# Patient Record
Sex: Female | Born: 1970 | Race: White | Hispanic: No | Marital: Single | Smoking: Never smoker
Health system: Southern US, Community
[De-identification: ages and names within clinical notes are randomized; demographics above are authoritative.]

## PROBLEM LIST (undated history)

## (undated) DIAGNOSIS — S0300XA Dislocation of jaw, unspecified side, initial encounter: Secondary | ICD-10-CM

## (undated) DIAGNOSIS — F419 Anxiety disorder, unspecified: Secondary | ICD-10-CM

## (undated) DIAGNOSIS — J302 Other seasonal allergic rhinitis: Secondary | ICD-10-CM

## (undated) DIAGNOSIS — M1990 Unspecified osteoarthritis, unspecified site: Secondary | ICD-10-CM

## (undated) DIAGNOSIS — IMO0002 Reserved for concepts with insufficient information to code with codable children: Secondary | ICD-10-CM

## (undated) HISTORY — DX: Dislocation of jaw, unspecified side, initial encounter: S03.00XA

## (undated) HISTORY — DX: Other seasonal allergic rhinitis: J30.2

## (undated) HISTORY — DX: Anxiety disorder, unspecified: F41.9

## (undated) HISTORY — PX: TUBAL LIGATION: SHX77

## (undated) HISTORY — PX: CHOLECYSTECTOMY: SHX55

---

## 1998-06-25 ENCOUNTER — Encounter: Admission: RE | Admit: 1998-06-25 | Discharge: 1998-06-25 | Payer: Self-pay | Admitting: Obstetrics

## 1998-08-06 ENCOUNTER — Encounter: Admission: RE | Admit: 1998-08-06 | Discharge: 1998-08-06 | Payer: Self-pay | Admitting: Obstetrics

## 1998-09-11 ENCOUNTER — Ambulatory Visit (HOSPITAL_COMMUNITY): Admission: RE | Admit: 1998-09-11 | Discharge: 1998-09-11 | Payer: Self-pay | Admitting: *Deleted

## 1998-10-07 ENCOUNTER — Encounter: Admission: RE | Admit: 1998-10-07 | Discharge: 1998-10-07 | Payer: Self-pay | Admitting: Obstetrics & Gynecology

## 1998-11-18 ENCOUNTER — Encounter: Admission: RE | Admit: 1998-11-18 | Discharge: 1998-11-18 | Payer: Self-pay | Admitting: Obstetrics & Gynecology

## 1998-12-09 ENCOUNTER — Encounter: Admission: RE | Admit: 1998-12-09 | Discharge: 1998-12-09 | Payer: Self-pay | Admitting: Obstetrics & Gynecology

## 1998-12-10 ENCOUNTER — Ambulatory Visit (HOSPITAL_COMMUNITY): Admission: RE | Admit: 1998-12-10 | Discharge: 1998-12-10 | Payer: Self-pay | Admitting: Obstetrics & Gynecology

## 1998-12-23 ENCOUNTER — Encounter (HOSPITAL_COMMUNITY): Admission: RE | Admit: 1998-12-23 | Discharge: 1998-12-27 | Payer: Self-pay | Admitting: Obstetrics & Gynecology

## 1998-12-23 ENCOUNTER — Encounter: Admission: RE | Admit: 1998-12-23 | Discharge: 1998-12-23 | Payer: Self-pay | Admitting: Obstetrics & Gynecology

## 1998-12-26 ENCOUNTER — Encounter (INDEPENDENT_AMBULATORY_CARE_PROVIDER_SITE_OTHER): Payer: Self-pay | Admitting: Specialist

## 1998-12-26 ENCOUNTER — Inpatient Hospital Stay (HOSPITAL_COMMUNITY): Admission: AD | Admit: 1998-12-26 | Discharge: 1998-12-29 | Payer: Self-pay | Admitting: *Deleted

## 1999-08-21 ENCOUNTER — Emergency Department (HOSPITAL_COMMUNITY): Admission: EM | Admit: 1999-08-21 | Discharge: 1999-08-21 | Payer: Self-pay | Admitting: Emergency Medicine

## 2000-06-09 ENCOUNTER — Encounter (INDEPENDENT_AMBULATORY_CARE_PROVIDER_SITE_OTHER): Payer: Self-pay

## 2000-06-09 ENCOUNTER — Other Ambulatory Visit: Admission: RE | Admit: 2000-06-09 | Discharge: 2000-06-09 | Payer: Self-pay | Admitting: Obstetrics

## 2000-08-18 ENCOUNTER — Other Ambulatory Visit: Admission: RE | Admit: 2000-08-18 | Discharge: 2000-08-18 | Payer: Self-pay | Admitting: Obstetrics & Gynecology

## 2000-08-18 ENCOUNTER — Encounter: Admission: RE | Admit: 2000-08-18 | Discharge: 2000-08-18 | Payer: Self-pay | Admitting: Obstetrics & Gynecology

## 2000-09-15 ENCOUNTER — Encounter: Admission: RE | Admit: 2000-09-15 | Discharge: 2000-09-15 | Payer: Self-pay | Admitting: Obstetrics & Gynecology

## 2001-04-06 ENCOUNTER — Emergency Department (HOSPITAL_COMMUNITY): Admission: EM | Admit: 2001-04-06 | Discharge: 2001-04-07 | Payer: Self-pay | Admitting: Emergency Medicine

## 2001-04-08 ENCOUNTER — Encounter: Payer: Self-pay | Admitting: Emergency Medicine

## 2001-04-08 ENCOUNTER — Emergency Department (HOSPITAL_COMMUNITY): Admission: EM | Admit: 2001-04-08 | Discharge: 2001-04-08 | Payer: Self-pay | Admitting: Emergency Medicine

## 2001-09-02 ENCOUNTER — Emergency Department (HOSPITAL_COMMUNITY): Admission: EM | Admit: 2001-09-02 | Discharge: 2001-09-03 | Payer: Self-pay | Admitting: Emergency Medicine

## 2002-04-18 ENCOUNTER — Other Ambulatory Visit: Admission: RE | Admit: 2002-04-18 | Discharge: 2002-04-18 | Payer: Self-pay | Admitting: Obstetrics and Gynecology

## 2002-04-18 ENCOUNTER — Encounter: Admission: RE | Admit: 2002-04-18 | Discharge: 2002-04-18 | Payer: Self-pay | Admitting: Obstetrics and Gynecology

## 2002-05-16 ENCOUNTER — Encounter: Admission: RE | Admit: 2002-05-16 | Discharge: 2002-05-16 | Payer: Self-pay | Admitting: Internal Medicine

## 2003-03-03 ENCOUNTER — Emergency Department (HOSPITAL_COMMUNITY): Admission: AD | Admit: 2003-03-03 | Discharge: 2003-03-03 | Payer: Self-pay | Admitting: Family Medicine

## 2003-05-09 ENCOUNTER — Emergency Department (HOSPITAL_COMMUNITY): Admission: EM | Admit: 2003-05-09 | Discharge: 2003-05-09 | Payer: Self-pay | Admitting: Emergency Medicine

## 2003-06-09 ENCOUNTER — Encounter: Admission: RE | Admit: 2003-06-09 | Discharge: 2003-06-09 | Payer: Self-pay | Admitting: Sports Medicine

## 2003-06-09 ENCOUNTER — Other Ambulatory Visit: Admission: RE | Admit: 2003-06-09 | Discharge: 2003-06-09 | Payer: Self-pay | Admitting: Family Medicine

## 2003-12-11 ENCOUNTER — Emergency Department (HOSPITAL_COMMUNITY): Admission: EM | Admit: 2003-12-11 | Discharge: 2003-12-11 | Payer: Self-pay | Admitting: Emergency Medicine

## 2003-12-30 ENCOUNTER — Emergency Department (HOSPITAL_COMMUNITY): Admission: EM | Admit: 2003-12-30 | Discharge: 2003-12-30 | Payer: Self-pay | Admitting: Emergency Medicine

## 2004-01-28 ENCOUNTER — Ambulatory Visit: Payer: Self-pay | Admitting: Family Medicine

## 2004-02-02 ENCOUNTER — Emergency Department (HOSPITAL_COMMUNITY): Admission: EM | Admit: 2004-02-02 | Discharge: 2004-02-02 | Payer: Self-pay | Admitting: Emergency Medicine

## 2004-08-06 ENCOUNTER — Ambulatory Visit: Payer: Self-pay | Admitting: Sports Medicine

## 2004-08-07 ENCOUNTER — Encounter (INDEPENDENT_AMBULATORY_CARE_PROVIDER_SITE_OTHER): Payer: Self-pay | Admitting: *Deleted

## 2004-08-07 LAB — CONVERTED CEMR LAB

## 2004-08-17 ENCOUNTER — Ambulatory Visit: Payer: Self-pay | Admitting: Family Medicine

## 2004-08-31 ENCOUNTER — Other Ambulatory Visit: Admission: RE | Admit: 2004-08-31 | Discharge: 2004-08-31 | Payer: Self-pay | Admitting: Family Medicine

## 2004-08-31 ENCOUNTER — Ambulatory Visit: Payer: Self-pay | Admitting: Family Medicine

## 2004-10-22 ENCOUNTER — Ambulatory Visit: Payer: Self-pay | Admitting: Family Medicine

## 2006-04-06 DIAGNOSIS — F411 Generalized anxiety disorder: Secondary | ICD-10-CM | POA: Insufficient documentation

## 2006-04-07 ENCOUNTER — Encounter (INDEPENDENT_AMBULATORY_CARE_PROVIDER_SITE_OTHER): Payer: Self-pay | Admitting: *Deleted

## 2007-02-03 ENCOUNTER — Emergency Department (HOSPITAL_COMMUNITY): Admission: EM | Admit: 2007-02-03 | Discharge: 2007-02-03 | Payer: Self-pay | Admitting: Family Medicine

## 2007-07-07 ENCOUNTER — Emergency Department (HOSPITAL_COMMUNITY): Admission: EM | Admit: 2007-07-07 | Discharge: 2007-07-07 | Payer: Self-pay | Admitting: Emergency Medicine

## 2007-07-10 ENCOUNTER — Emergency Department (HOSPITAL_COMMUNITY): Admission: EM | Admit: 2007-07-10 | Discharge: 2007-07-10 | Payer: Self-pay | Admitting: Family Medicine

## 2007-07-13 ENCOUNTER — Emergency Department (HOSPITAL_COMMUNITY): Admission: EM | Admit: 2007-07-13 | Discharge: 2007-07-13 | Payer: Self-pay | Admitting: Emergency Medicine

## 2007-08-02 ENCOUNTER — Emergency Department (HOSPITAL_COMMUNITY): Admission: EM | Admit: 2007-08-02 | Discharge: 2007-08-02 | Payer: Self-pay | Admitting: Family Medicine

## 2007-11-08 ENCOUNTER — Ambulatory Visit: Payer: Self-pay | Admitting: Family Medicine

## 2007-11-08 LAB — CONVERTED CEMR LAB
Bilirubin Urine: NEGATIVE
Glucose, Urine, Semiquant: NEGATIVE
Ketones, urine, test strip: NEGATIVE
Nitrite: POSITIVE
Protein, U semiquant: 100
Specific Gravity, Urine: 1.015
Urobilinogen, UA: 0.2
pH: 6

## 2008-01-01 ENCOUNTER — Telehealth (INDEPENDENT_AMBULATORY_CARE_PROVIDER_SITE_OTHER): Payer: Self-pay | Admitting: *Deleted

## 2008-01-10 ENCOUNTER — Telehealth: Payer: Self-pay | Admitting: *Deleted

## 2008-01-11 ENCOUNTER — Ambulatory Visit: Payer: Self-pay | Admitting: Family Medicine

## 2008-01-11 ENCOUNTER — Encounter (INDEPENDENT_AMBULATORY_CARE_PROVIDER_SITE_OTHER): Payer: Self-pay | Admitting: Family Medicine

## 2008-01-11 LAB — CONVERTED CEMR LAB
Bilirubin Urine: NEGATIVE
Glucose, Urine, Semiquant: 100
Ketones, urine, test strip: NEGATIVE
Nitrite: NEGATIVE
Protein, U semiquant: NEGATIVE
Specific Gravity, Urine: 1.02
Urobilinogen, UA: 0.2
pH: 6

## 2008-04-24 ENCOUNTER — Encounter: Payer: Self-pay | Admitting: Family Medicine

## 2008-04-24 ENCOUNTER — Other Ambulatory Visit: Admission: RE | Admit: 2008-04-24 | Discharge: 2008-04-24 | Payer: Self-pay | Admitting: Family Medicine

## 2008-04-24 ENCOUNTER — Ambulatory Visit: Payer: Self-pay | Admitting: Family Medicine

## 2008-04-24 DIAGNOSIS — J301 Allergic rhinitis due to pollen: Secondary | ICD-10-CM

## 2008-04-24 DIAGNOSIS — N946 Dysmenorrhea, unspecified: Secondary | ICD-10-CM | POA: Insufficient documentation

## 2008-04-24 DIAGNOSIS — K219 Gastro-esophageal reflux disease without esophagitis: Secondary | ICD-10-CM | POA: Insufficient documentation

## 2008-04-24 LAB — CONVERTED CEMR LAB
ALT: 9 units/L (ref 0–35)
AST: 14 units/L (ref 0–37)
Albumin: 4.3 g/dL (ref 3.5–5.2)
Alkaline Phosphatase: 47 units/L (ref 39–117)
BUN: 13 mg/dL (ref 6–23)
CO2: 23 meq/L (ref 19–32)
Calcium: 9.3 mg/dL (ref 8.4–10.5)
Chlamydia, DNA Probe: NEGATIVE
Chloride: 105 meq/L (ref 96–112)
Creatinine, Ser: 0.83 mg/dL (ref 0.40–1.20)
GC Probe Amp, Genital: NEGATIVE
Glucose, Bld: 69 mg/dL — ABNORMAL LOW (ref 70–99)
HCT: 40.4 % (ref 36.0–46.0)
Hemoglobin: 12.9 g/dL (ref 12.0–15.0)
MCHC: 31.9 g/dL (ref 30.0–36.0)
MCV: 91.2 fL (ref 78.0–100.0)
Platelets: 362 10*3/uL (ref 150–400)
Potassium: 4.1 meq/L (ref 3.5–5.3)
RBC: 4.43 M/uL (ref 3.87–5.11)
RDW: 12.6 % (ref 11.5–15.5)
Sodium: 141 meq/L (ref 135–145)
TSH: 1.045 microintl units/mL (ref 0.350–4.500)
Total Bilirubin: 0.7 mg/dL (ref 0.3–1.2)
Total Protein: 7.2 g/dL (ref 6.0–8.3)
WBC: 7.4 10*3/uL (ref 4.0–10.5)
Whiff Test: NEGATIVE

## 2008-04-25 ENCOUNTER — Encounter: Payer: Self-pay | Admitting: Family Medicine

## 2008-07-14 ENCOUNTER — Emergency Department (HOSPITAL_COMMUNITY): Admission: EM | Admit: 2008-07-14 | Discharge: 2008-07-14 | Payer: Self-pay | Admitting: Emergency Medicine

## 2008-09-02 ENCOUNTER — Ambulatory Visit: Payer: Self-pay | Admitting: Family Medicine

## 2009-10-01 ENCOUNTER — Emergency Department (HOSPITAL_COMMUNITY): Admission: EM | Admit: 2009-10-01 | Discharge: 2009-10-01 | Payer: Self-pay | Admitting: Emergency Medicine

## 2009-12-22 ENCOUNTER — Emergency Department (HOSPITAL_COMMUNITY): Admission: EM | Admit: 2009-12-22 | Discharge: 2009-12-22 | Payer: Self-pay | Admitting: Emergency Medicine

## 2010-01-13 ENCOUNTER — Emergency Department (HOSPITAL_COMMUNITY)
Admission: EM | Admit: 2010-01-13 | Discharge: 2010-01-13 | Payer: Self-pay | Source: Home / Self Care | Admitting: Emergency Medicine

## 2010-03-10 ENCOUNTER — Ambulatory Visit (INDEPENDENT_AMBULATORY_CARE_PROVIDER_SITE_OTHER): Payer: Medicaid Other | Admitting: Family Medicine

## 2010-03-10 ENCOUNTER — Encounter: Payer: Self-pay | Admitting: Family Medicine

## 2010-03-10 DIAGNOSIS — M62838 Other muscle spasm: Secondary | ICD-10-CM | POA: Insufficient documentation

## 2010-03-10 DIAGNOSIS — M26629 Arthralgia of temporomandibular joint, unspecified side: Secondary | ICD-10-CM

## 2010-03-10 DIAGNOSIS — R5383 Other fatigue: Secondary | ICD-10-CM | POA: Insufficient documentation

## 2010-03-10 DIAGNOSIS — R5381 Other malaise: Secondary | ICD-10-CM

## 2010-03-10 DIAGNOSIS — M792 Neuralgia and neuritis, unspecified: Secondary | ICD-10-CM | POA: Insufficient documentation

## 2010-03-10 DIAGNOSIS — F411 Generalized anxiety disorder: Secondary | ICD-10-CM

## 2010-03-10 LAB — CONVERTED CEMR LAB
ALT: 30 units/L (ref 0–35)
AST: 23 units/L (ref 0–37)
Albumin: 4.2 g/dL (ref 3.5–5.2)
Alkaline Phosphatase: 52 units/L (ref 39–117)
BUN: 11 mg/dL (ref 6–23)
CO2: 27 meq/L (ref 19–32)
Calcium: 9.1 mg/dL (ref 8.4–10.5)
Chloride: 98 meq/L (ref 96–112)
Creatinine, Ser: 0.75 mg/dL (ref 0.40–1.20)
Glucose, Bld: 79 mg/dL (ref 70–99)
HCT: 37.8 % (ref 36.0–46.0)
Hemoglobin: 12.5 g/dL (ref 12.0–15.0)
MCHC: 33.1 g/dL (ref 30.0–36.0)
MCV: 93.6 fL (ref 78.0–100.0)
Platelets: 299 10*3/uL (ref 150–400)
Potassium: 4.2 meq/L (ref 3.5–5.3)
RBC: 4.04 M/uL (ref 3.87–5.11)
RDW: 12.6 % (ref 11.5–15.5)
Sodium: 135 meq/L (ref 135–145)
TSH: 2.161 microintl units/mL (ref 0.350–4.500)
Total Bilirubin: 0.4 mg/dL (ref 0.3–1.2)
Total Protein: 7.1 g/dL (ref 6.0–8.3)
WBC: 5.2 10*3/uL (ref 4.0–10.5)

## 2010-03-11 ENCOUNTER — Encounter: Payer: Self-pay | Admitting: Family Medicine

## 2010-03-11 ENCOUNTER — Ambulatory Visit (INDEPENDENT_AMBULATORY_CARE_PROVIDER_SITE_OTHER): Payer: Medicaid Other | Admitting: Family Medicine

## 2010-03-11 DIAGNOSIS — M62838 Other muscle spasm: Secondary | ICD-10-CM

## 2010-03-11 DIAGNOSIS — J301 Allergic rhinitis due to pollen: Secondary | ICD-10-CM

## 2010-03-11 DIAGNOSIS — M9981 Other biomechanical lesions of cervical region: Secondary | ICD-10-CM | POA: Insufficient documentation

## 2010-03-11 DIAGNOSIS — M999 Biomechanical lesion, unspecified: Secondary | ICD-10-CM | POA: Insufficient documentation

## 2010-03-11 NOTE — Miscellaneous (Signed)
Summary: PA for allegra   Clinical Lists Changes pa to md..Sally [facility] RN  April 25, 2008 2:36 PM   Patient does not qualify at this time.

## 2010-03-11 NOTE — Assessment & Plan Note (Signed)
Summary: possible bladder infection/eo   Vital Signs:  Patient Profile:   40 Years Old Female Weight:      118.6 pounds Temp:     98.7 degrees F Pulse rate:   106 / minute BP sitting:   109 / 77  (left arm)  Pt. in pain?   no  Vitals Entered By: Arlyss Repress CMA, (November 08, 2007 2:54 PM)                  PCP:  Helane Rima MD  Chief Complaint:  pressure with urination. frequency and dysuria x 1 week.  History of Present Illness: Bennetta is a 40 year old female presenting with dysuria, urinary frequency, and suprapubic pain x 1 week. She denies fever/chills, N/V/D, vaginal discharge, and high risk sexual behavior.    Current Allergies: No known allergies   Past Surgical History:    Reviewed history from 04/06/2006 and no changes required:       CS with BTL - 2000 -       Gallbladder surgery -1995 -   Family History:    Reviewed history from 04/06/2006 and no changes required:       CAD - grandmother       Colon CA -pat greatgrandmother       DM - paternal aunt/uncle       Thyroid disease-grandfather/maternal aunt  Social History:    Reviewed history from 04/06/2006 and no changes required:       Single. Lives with same partner x 5 yrs. 3 children (4 yr old child lives with her). 11y/o son lives w/ grandmother, 43 y/o child given up for adoption. No smoking, alcohol use.     Physical Exam  General:     Well-developed,well-nourished,in no acute distress; alert,appropriate and cooperative throughout examination Lungs:     Normal respiratory effort, chest expands symmetrically. Lungs are clear to auscultation, no crackles or wheezes. Heart:     Normal rate and regular rhythm. S1 and S2 normal without gallop, murmur, click, rub or other extra sounds. Abdomen:     Soft and normal bowel sounds, mild seprapubic tenderness to palpation, no CVA tenderness.    Impression & Recommendations:  Problem # 1:  UTI (ICD-599.0) Uncomplicated UTI.  Rx: Bactrim DS  2 tabs two times a day x 3 days. AZO OTC for relief of dysuria.  Advised to return if symptoms worsen or if she has fever, chills, back pain or other signs of pyelonephritis. Schedule physical.  Other Orders: Urinalysis-[facility] (00000)    ]  Laboratory Results   Urine Tests  Date/Time Received: November 08, 2007 2:58 PM  Date/Time Reported: November 08, 2007 3:37 PM   Routine Urinalysis   Color: yellow Appearance: Cloudy Glucose: negative   (Normal Range: Negative) Bilirubin: negative   (Normal Range: Negative) Ketone: negative   (Normal Range: Negative) Spec. Gravity: 1.015   (Normal Range: 1.003-1.035) Blood: moderate   (Normal Range: Negative) pH: 6.0   (Normal Range: 5.0-8.0) Protein: 100   (Normal Range: Negative) Urobilinogen: 0.2   (Normal Range: 0-1) Nitrite: positive   (Normal Range: Negative) Leukocyte Esterace: large   (Normal Range: Negative)  Urine Microscopic WBC/HPF: TNTC RBC/HPF: 1-5 Bacteria/HPF: 2+ Epithelial/HPF: 5-8    Comments: ...........test performed by...........Marland KitchenTerese Door, CMA      Appended Document: Orders Update     Clinical Lists Changes  Orders: Added new Test order of [facility]- Est Level  2 (60454) - Signed

## 2010-03-11 NOTE — Assessment & Plan Note (Signed)
Summary: CPE/pap,df   Vital Signs:  Patient profile:   40 year old female Height:      64.5 inches Weight:      125.9 pounds BMI:     21.35 Pulse rate:   90 / minute BP sitting:   113 / 76  (right arm)  Vitals Entered By: Arlyss Repress CMA, (April 24, 2008 3:44 PM) CC: Abdominal Pain Is Patient Diabetic? No Pain Assessment Patient in pain? no        History of Present Illness: Tasha Fernandez is a 40 year female presenting for CPE with PAP.  1. Dysmenorrhea: Complains of "bad periods" for much of her life. Has had some bleeding between periods recently. Flow usually monthly, 4-5 days. Patient in monogomous relationship with boyfriend. Hx BTL.  Hx of Anemia. Denies fatigue, SOB, CP. She has never tried BCPs. No FamHx of blood clots. She denies smoking. No evidence of early menopause including hot flashes or other changes in menses.  2. Seasonal Allergies: Patient states that she has severe allergies during certain times of the year, leading to nasal congestion and headaches. Requests Allegra-D. She denies problems at this time.  3. GERD: Complains of heartburn when she eats spicy foods or drinks alcohol. No pain at this time, but when she has the pain, it is intermittent, located epigastric region with some radiation to back, better with Zantac only sometimes.    Dyspepsia History:      There is a prior history of GERD.  She notes that it has been less than 12 months since the last episode of GERD, that there have been breakthrough symptoms despite maximum H-2 blocker or PPI therapy, and that she has never had an upper endoscopy or GI consultation.  The patient does not have a prior history of documented ulcer disease.  The dominant symptom is heartburn or acid reflux.  An H-2 blocker medication is currently being taken.  She notes that the symptoms have not improved with the H-2 blocker therapy.  Symptoms have persisted after 4 weeks of H-2 blocker treatment.  No previous upper  endoscopy has been done.     Habits & Providers     Tobacco Status: never  Current Medications (verified): 1)  Ortho Tri-Cyclen Lo 0.025 Mg Tabs (Norgestimate-Ethinyl Estradiol) .... Take One By Mouth Daily 2)  Omeprazole 20 Mg Cpdr (Omeprazole) .... One By Mouth Daily 3)  Allegra 180 Mg Tabs (Fexofenadine Hcl) .... Take One By Mouth Daily 4)  Allegra-D 24 Hour 180-240 Mg Xr24h-Tab (Fexofenadine-Pseudoephedrine)  Allergies (verified): No Known Drug Allergies  Past History:  Past Medical History:    Seasonal Allergies  Past Surgical History:    Caesarean section 2000    Cholecystectomy 1995    Tubal ligation 2000  Family History:    CAD- grandmother    Colon CA- pat greatgrandmother    DM- paternal aunt/uncle    Thyroid disease- grandfather/maternal aunt  Social History:    Smoking Status:  never  Review of Systems General:  Denies chills, fatigue, fever, loss of appetite, malaise, sleep disorder, sweats, weakness, and weight loss. Eyes:  Denies itching. ENT:  Complains of nasal congestion and postnasal drainage; denies earache, sinus pressure, and sore throat. GI:  Complains of abdominal pain and indigestion; denies constipation, diarrhea, nausea, and vomiting. GU:  Denies abnormal vaginal bleeding, decreased libido, discharge, dysuria, genital sores, urinary frequency, and urinary hesitancy. Allergy:  Complains of seasonal allergies and sneezing; denies hives or rash and persistent infections.  Physical  Exam  General:  Well-developed, well-nourished, in no acute distress; alert, appropriate and cooperative throughout examination. Head:  Normocephalic and atraumatic without obvious abnormalities. Eyes:  Vision grossly intact, pupils equal, pupils round, and pupils reactive to light.   Ears:  R ear normal and L ear normal.   Nose:  No external deformity and no nasal discharge.   Mouth:  Pharynx pink and moist.   Neck:  No deformities, masses, or tenderness  noted. Lungs:  Normal respiratory effort, chest expands symmetrically. Lungs are clear to auscultation, no crackles or wheezes. Heart:  Normal rate and regular rhythm. S1 and S2 normal without gallop, murmur, click, rub or other extra sounds. Abdomen:  Bowel sounds positive,abdomen soft and non-tender without masses, organomegaly or hernias noted. Genitalia:  Pelvic Exam:        External: normal female genitalia without lesions or masses        Vagina: normal without lesions or masses        Cervix: normal without lesions or masses        Adnexa: normal bimanual exam without masses or fullness        Uterus: normal by palpation        Pap smear: performed Msk:  No deformity or scoliosis noted of thoracic or lumbar spine.   Pulses:  R and L carotid, dorsalis pedis, and posterior tibial pulses are full and equal bilaterally Extremities:  No clubbing, cyanosis, edema, or deformity noted with normal full range of motion of all joints.   Neurologic:  No cranial nerve deficits noted. Station and gait are normal. DTRs are symmetrical throughout. Sensory, motor and coordinative functions appear intact. Skin:  Intact without suspicious lesions or rashes. Psych:  Oriented X 3, pressured speech, easily distracted.   Impression & Recommendations:  Problem # 1:  Gynecological examination-routine (ICD-V72.31) Assessment Unchanged Normal Exam.   Problem # 2:  SCREENING FOR MALIGNANT NEOPLASM OF THE CERVIX (ICD-V76.2) Assessment: Unchanged  Orders: Pap Smear-[facility] ([id number]) [facility] - Est  18-39 yrs ([address]11)  Problem # 3:  SEXUALLY TRANSMITTED DISEASE, EXPOSURE TO (ICD-V01.6) Will check STDs including GC/Chlamydia, RPR, HIV, Trichomonas. Gave free condoms.  Problem # 4:  DYSMENORRHEA (ICD-625.3) Assessment: Deteriorated Will Rx BCP in order to regulate menses, decrease flow. Patient with no FamHx blood clots, no tobacco. Will check CBC, TSH.  Orders: Comp Met-[facility] [id number]) CBC-[facility]  ([address]) [facility] - Est  18-39 yrs (57846)  Problem # 5:  ALLERGIC RHINITIS, SEASONAL (ICD-477.0) Assessment: New Discussed antihistamines including Zyrtec, Claritin, and Allegra. Patient wishing to take Allegra since it was recommended by a friend.  Orders: Comp Met-[facility] [phone]) [facility] - Est  18-39 yrs (24401)  Problem # 6:  GERD (ICD-530.81) Assessment: New Patient has some relief from Zantac. If dose is optimized and continues to be insufficient, may take Omeprazole. If worsens, will check H. pylori.  Her updated medication list for this problem includes:    Omeprazole 20 Mg Cpdr (Omeprazole) ..... One by mouth daily  Orders: CBC-[facility] (02725) [facility] - Est  18-39 yrs (36644)  Complete Medication List: 1)  Ortho Tri-cyclen Lo 0.025 Mg Tabs (Norgestimate-ethinyl estradiol) .... Take one by mouth daily 2)  Omeprazole 20 Mg Cpdr (Omeprazole) .... One by mouth daily 3)  Allegra 180 Mg Tabs (Fexofenadine hcl) .... Take one by mouth daily 4)  Allegra-d 24 Hour 180-240 Mg Xr24h-tab (Fexofenadine-pseudoephedrine)  Other Orders: TSH-[facility] ([id number]) HIV-[facility] ([id number]) RPR-[facility] [phone]) GC/Chlamydia-[facility] (87591/[id number]) Wet Prep- [facility] (30160)  Dyspepsia Assessment/Plan:  Step Therapy: GERD  Treatment Protocols:    Step-1: started    H-2 blocker chosen: Ranitidine 150mg  by mouth at bedtime    Step-2: started  Patient Instructions: 1)  Please schedule a follow-up appointment in 1 year.  2)  Please call if you have any questions or concerns. 3)  We will call with the results of your lab tests. 4)  The medication list was reviewed and reconciled.  All changed/ newly prescribed medications were explained.  A complete medication list was provided to the patient/ caregiver. Prescriptions: ALLEGRA-D 24 HOUR 180-240 MG XR24H-TAB (FEXOFENADINE-PSEUDOEPHEDRINE)   #30 x 1   Entered and Authorized by:   Helane Rima MD   Signed by:   Helane Rima MD on 04/24/2008   Method used:   Print then  Give to Patient   RxID:   [id number] ALLEGRA 180 MG TABS (FEXOFENADINE HCL) take one by mouth daily  #30 x 3   Entered and Authorized by:   Helane Rima MD   Signed by:   Helane Rima MD on 04/24/2008   Method used:   Print then Give to Patient   RxID:   [id number] OMEPRAZOLE 20 MG CPDR (OMEPRAZOLE) one by mouth daily  #90 x 3   Entered and Authorized by:   Helane Rima MD   Signed by:   Helane Rima MD on 04/24/2008   Method used:   Print then Give to Patient   RxID:   [id number] ORTHO TRI-CYCLEN LO 0.025 MG TABS (NORGESTIMATE-ETHINYL ESTRADIOL) take one by mouth daily  #1 x 3   Entered and Authorized by:   Helane Rima MD   Signed by:   Helane Rima MD on 04/24/2008   Method used:   Print then Give to Patient   RxID:   [id number]   Laboratory Results  Date/Time Received: April 24, 2008 4:03 PM  Date/Time Reported: April 24, 2008 4:33 PM   Wet [place] Source: vag WBC/hpf: 1-5 Bacteria/hpf: 2+  Rods Clue cells/hpf: none  Negative whiff Yeast/hpf: none Trichomonas/hpf: none Comments: sperm present ...............test performed by......Marland KitchenBonnie A. [place], [place] (ASCP)

## 2010-03-12 ENCOUNTER — Telehealth: Payer: Self-pay | Admitting: Family Medicine

## 2010-03-15 ENCOUNTER — Telehealth: Payer: Self-pay | Admitting: Family Medicine

## 2010-03-17 NOTE — Progress Notes (Signed)
Summary: phn msg   Phone Note Call from Patient Call back at [facility] Phone [phone] Call back at [phone]   Caller: Patient Summary of Call: has a question about fluid in ear Initial call taken by: De Nurse,  March 12, 2010 10:55 AM  Follow-up for Phone Call        she got an OTC decongestant instead of the Zyrtec. told her to take for 1 wkk & she should feel better. wanted to know if we will drain it if it does not relieve the fluid in the ear. told her no, we do not drain it. encourged her to keep taking the decongestant. call back in a wk for another appt if no relief. she agreed with the plan Follow-up by: Golden Circle RN,  March 12, 2010 11:11 AM

## 2010-03-17 NOTE — Letter (Signed)
Summary: Generic Letter  [facility]  [address]   [place], [place] [address]   Phone: [phone]  Fax: [phone]    03/11/2010  RAZIA SCREWS [address] [place], [place]  [address]  Dear Ms. Osorno,  I am happy to report that your recent labs were all normal.  Sincerely,   Helane Rima DO

## 2010-03-17 NOTE — Assessment & Plan Note (Signed)
Summary: MUSCLE TX PER DR WALLACE/RH   Vital Signs:  Patient profile:   40 year old female Height:      64.5 inches Weight:      145.56 pounds BMI:     24.69 BSA:     1.72 Pulse rate:   87 / minute BP sitting:   117 / 75  Vitals Entered By: Jone Baseman CMA (March 11, 2010 2:24 PM) CC: manipulation Is Patient Diabetic? No Pain Assessment Patient in pain? no        Primary Care Provider:  Helane Rima DO  CC:  manipulation.  History of Present Illness: 40 yo female here for  1.  Migraines-  Pt states she has been headache for about 7 months.  Pt also sattes she has had a sore throat but denies fever, chills, nausea, vomiting, diarrhea or constipation.  Pt is being treated for allergies but has not been using her flonas eon a regular basis. Pt has been put on a trial of tegratol for possible migraines but has not helped.  Pt has been using goody powder regularly as well and only a little improvement  2.  Pt was sent to me today for manipulation due to chronic muscle spasm. Pt states that her neck hurts on a daily basis and her back hurts as well.   Pt has had a good evaluation by her PCP without any rwed flag findings.  Pt has never been manipulated before  Does sleep on her right side with her right arm up.  Pain is mostly right sided but occurs on both side, worse through the day a lttile improvement with goody powders. No numbness, no loss of strength no bowel and bladder problems.   Habits & Providers  Alcohol-Tobacco-Diet     Tobacco Status: never  Current Medications (verified): 1)  Omeprazole 20 Mg Cpdr (Omeprazole) .... One By Mouth Daily 2)  Flonase 50 Mcg/act Susp (Fluticasone Propionate) .... 2 Squirts in Each Nostril Daily, Then After On Week Just One Squirt in Each Nostril  Allergies (verified): No Known Drug Allergies  Past History:  Past medical, surgical, family and social histories (including risk factors) reviewed, and no changes noted (except as  noted below).  Past Medical History: Reviewed history from 04/24/2008 and no changes required. Seasonal Allergies  Past Surgical History: Reviewed history from 04/24/2008 and no changes required. Caesarean section 2000 Cholecystectomy 1995 Tubal ligation 2000  Family History: Reviewed history from 04/24/2008 and no changes required. CAD- grandmother Colon CA- pat greatgrandmother DM- paternal aunt/uncle Thyroid disease- grandfather/maternal aunt  Social History: Reviewed history from 11/08/2007 and no changes required. Single. Lives with same partner x 5 yrs. 3 children (4 yr old child lives with her). 11y/o son lives w/ grandmother, 60 y/o child given up for adoption. No smoking, alcohol use.  Review of Systems       see hpi  Physical Exam  General:  Well-developed, well-nourished, in no acute distress; alert, appropriate and cooperative throughout examination. Vitals reviewed. Eyes:  No corneal or conjunctival inflammation noted. EOMI. Perrla. Funduscopic exam benign, without hemorrhages, exudates or papilledema. Vision grossly normal. Ears:  TM intact b/l + fluid level b/l no erythema non bulging.  Nose:  bluish hue to turbinates b/l no congestion Mouth:  +PND Neck:  shotty LAD Lungs:  Clear, normal effort. Heart:  RRR. Msk:  OMT findings  C4 right  C2 left T4 right T7 left L2 left Left on left sacrum .  Normal leg length  Pulses:  R and L carotid, dorsalis pedis, and posterior tibial pulses are full and equal bilaterally. Extremities:  No clubbing, cyanosis, edema, or deformity noted with normal full range of motion of all joints.     Impression & Recommendations:  Problem # 1:  ALLERGIC RHINITIS, SEASONAL (ICD-477.0) told pt to attempt to change to another antihistamine.   Suggested zyrtec OTC at bedtime to try.  To take flonase on reg basis.  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  Improved with muscle energy directed to the psoas muscle.  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  Orders: OMT 3-4 Body [place] [phone])  Problem # 5:  SPASM, MUSCLE (ICD-728.85) as above, told pt not to asleep with elbow above shoulder. Told to use motrin if muscle spasm occurs again.  Orders: [facility]- Est Level  3 (99213) OMT 3-4 Body Regions (29562)  Complete Medication List: 1)  Omeprazole 20 Mg Cpdr (Omeprazole) .... One by mouth daily 2)  Flonase 50 Mcg/act Susp (Fluticasone propionate) .... 2 squirts in each nostril daily, then after on week just one squirt in each nostril 3)  Zyrtec Allergy 10 Mg Tabs (Cetirizine hcl) .Marland Kitchen.. 1 tab at bedtime 4)  Motrin Ib 200 Mg Tabs (Ibuprofen) .... 3 tabs by mouth three times a day as needed for muscle spasm   Orders Added: 1)  [facility]- Est Level  3 [13086] 2)  OMT 3-4 Body Regions [57846]

## 2010-03-17 NOTE — Assessment & Plan Note (Signed)
Summary: discuss multiple issues/eo   Vital Signs:  Patient profile:   40 year old female Height:      64.5 inches Weight:      146 pounds BMI:     24.76 Temp:     98.0 degrees F oral Pulse rate:   79 / minute BP sitting:   105 / 69  (right arm) Cuff size:   regular  Vitals Entered By: Tessie Fass CMA (March 10, 2010 10:43 AM) CC: multiple issues Is Patient Diabetic? No   Primary Care Provider:  Helane Rima DO  CC:  multiple issues.  History of Present Illness: 40 yo F:  1. EAR/TMJ/HEAD/NECK PAIN: started 7 months ago. Pain is vague - ear/jaw/head, intermittent, "10/10." She denies fever/chills, N/V/D, dizziness, vision changes, neuro deficits. She endorses having a sore dry throat and swelling around her glands intermittently. "I feel like I have flu-ish symptoms, but I don't have the flu." She denies stress, depression, no changes in her life.   patient was evaluated in ED for this issue. she has been seen by mutiple specialists, including dentists, oral surgeons, neurologists, and ENT. 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 She is being followed by Dr. Maple Hudson - new medications include "Tegretol, some medication to slow down HR (BB), and some depression medication." She states that nothing is helping.  Habits & Providers  Alcohol-Tobacco-Diet     Tobacco Status: never  Current Medications (verified): 1)  Ortho Tri-Cyclen Lo 0.025 Mg Tabs (Norgestimate-Ethinyl Estradiol) .... Take One By Mouth Daily 2)  Omeprazole 20 Mg Cpdr (Omeprazole) .... One By Mouth Daily 3)  Flonase 50 Mcg/act Susp (Fluticasone Propionate) .... 2 Squirts in Each Nostril Daily, Then After On Week Just One Squirt in Each Nostril  Allergies (verified): No Known Drug Allergies PMH-FH-SH reviewed for relevance  Review of Systems      See HPI  Physical Exam  General:  Well-developed, well-nourished, in no acute distress; alert, appropriate and cooperative  throughout examination. Vitals reviewed. Head:  Normocephalic and atraumatic without obvious abnormalities. Patient endorses pain when opening mouth more wide. She does have slight click and dislocation on palpation left TMJ. Eyes:  No corneal or conjunctival inflammation noted. EOMI. Perrla. Funduscopic exam benign, without hemorrhages, exudates or papilledema. Vision grossly normal. Ears:  R ear normal and L ear normal.   Nose:  External nasal examination shows no deformity or inflammation. Nasal mucosa are pink and moist without lesions or exudates. Mouth:  Oral mucosa and oropharynx without lesions or exudates.  Teeth in good repair. Neck:  No deformities, masses, or tenderness noted. Lungs:  Clear, normal effort. Heart:  RRR. Pulses:  R and L carotid, dorsalis pedis, and posterior tibial pulses are full and equal bilaterally. Neurologic:  Alert & oriented X3, cranial nerves II-XII intact,  sensation intact to light touch.   Impression & Recommendations:  Problem # 1:  SPASM, MUSCLE (ICD-728.85) Assessment New  Patient with slightly pressured speech today and it is difficult to tease out her symptoms. 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 I believe that all of her issues are arising from cervical/upper trap muscle tension. Discussed relaxation techniques, massage, and proper posture with patient. Discussed OMT for this issue and she was interested in trying it. Will refer to Freddy Finner for OMT.   Orders: [facility]- Est  Level 4 ([address])  Problem # 2:  TMJ PAIN (ICD-524.62) Assessment: New  Patient will f/u with specialist as they  are already treating her. I will not Rx pain medications for this issue.  Orders: [facility]- Est  Level 4 (29562)  Problem # 3:  ANXIETY (ICD-300.00) Assessment: Unchanged This is likely contributing to the patient's s/s. However, she is unwilling to accept this at this time. Orders: [facility]- Est  Level 4 (99214)  Complete Medication List: 1)  Ortho  Tri-cyclen Lo 0.025 Mg Tabs (Norgestimate-ethinyl estradiol) .... Take one by mouth daily 2)  Omeprazole 20 Mg Cpdr (Omeprazole) .... One by mouth daily 3)  Flonase 50 Mcg/act Susp (Fluticasone propionate) .... 2 squirts in each nostril daily, then after on week just one squirt in each nostril  Other Orders: Comp Met-[facility] ([id number]) CBC-[facility] (96295) TSH-[facility] ([id number])  Patient Instructions: 1)  It was nice to see you today. 2)  I believe that your pain is coming from muscle tension. 3)  PLEASE MAKE AN APPOINTMENT TO SEE DR. [facility] FOR MUSCLE TREATMENT. 4)  Please ask Dr. Maple Hudson to send records to me. 5)  I am checking labs today.   Orders Added: 1)  Comp Met-[facility] [[id number]] 2)  CBC-[facility] [85027] 3)  TSH-[facility] [[id number]] 4)  [facility]- Est  Level 4 [03474]  Appended Document: discuss multiple issues/eo     Allergies: No Known Drug Allergies  Physical Exam  Msk:  Hypertonic cervical/upper trap mm. Motion restriction rotation right. Poor posture with head forward.   Complete Medication List: 1)  Ortho Tri-cyclen Lo 0.025 Mg Tabs (Norgestimate-ethinyl estradiol) .... Take one by mouth daily 2)  Omeprazole 20 Mg Cpdr (Omeprazole) .... One by mouth daily 3)  Flonase 50 Mcg/act Susp (Fluticasone propionate) .... 2 squirts in each nostril daily, then after on week just one squirt in each nostril

## 2010-03-19 ENCOUNTER — Telehealth: Payer: Self-pay | Admitting: Family Medicine

## 2010-03-25 NOTE — Progress Notes (Signed)
   Phone Note Call from Patient   Caller: Patient Call For: [phone] Summary of Call: Please call patient back for results to tests taken last week/ Initial call taken by: Abundio Miu,  March 15, 2010 2:18 PM  Follow-up for Phone Call        LM Follow-up by: Golden Circle RN,  March 15, 2010 3:42 PM  Additional Follow-up for Phone Call Additional follow up Details #1::        Pt called back to leave new contact number.  [phone] Additional Follow-up by: Abundio Miu,  March 15, 2010 3:48 PM    Additional Follow-up for Phone Call Additional follow up Details #2::    told her all are normal & md has mailed her a letter stating this.  she talked at length about her problems. difficult to redirect. has appt in a few wks with Dr. Katrinka Blazing Follow-up by: Golden Circle RN,  March 15, 2010 4:22 PM

## 2010-03-29 ENCOUNTER — Encounter: Payer: Self-pay | Admitting: Family Medicine

## 2010-03-29 ENCOUNTER — Ambulatory Visit (INDEPENDENT_AMBULATORY_CARE_PROVIDER_SITE_OTHER): Payer: Medicaid Other | Admitting: Family Medicine

## 2010-03-29 VITALS — BP 116/85 | HR 94 | Temp 98.5°F | Ht 65.0 in | Wt 141.0 lb

## 2010-03-29 DIAGNOSIS — J069 Acute upper respiratory infection, unspecified: Secondary | ICD-10-CM

## 2010-03-29 DIAGNOSIS — J301 Allergic rhinitis due to pollen: Secondary | ICD-10-CM

## 2010-03-29 DIAGNOSIS — J029 Acute pharyngitis, unspecified: Secondary | ICD-10-CM

## 2010-03-29 MED ORDER — CETIRIZINE HCL 10 MG PO TABS: 10.0000 mg | ORAL_TABLET | Freq: Every day | ORAL | Status: AC

## 2010-03-29 MED ORDER — AZITHROMYCIN 500 MG PO TABS: 500.0000 mg | ORAL_TABLET | Freq: Every day | ORAL | Status: AC

## 2010-03-29 NOTE — Assessment & Plan Note (Signed)
Due to pt hx of long process and duration will treat as URI even though clinically pt seems well but pt was adamant this is not normal for her Azithro x 3 days Stop Flonase for 48 hours then take as prescribed educated pt on the potential consequence of increasing dose on own Told to change antihistamine.

## 2010-03-29 NOTE — Progress Notes (Signed)
  Subjective:    Patient ID: Tasha Fernandez, female    DOB: April 21, 1970, 40 y.o.   MRN: [id number]  HPI Pt states she has had some sore throat for some time now, States it has been weeks, has tried many OTC medications without help.  Has been using her Flonase multiple times a day without relief, kept denies fever, chills and nausea or vomiting.  Does state she has had a cough that was productive for sometime but not anymore.    Review of Systems see above     Objective:   Physical Exam     General Appearance:    Alert, cooperative, no distress, appears stated age     Eyes:    PERRL, conjunctiva/corneas clear, EOM's intact,   Ears:    Normal TM's and external ear canals, both ears  Nose:   Nares normal, septum midline, mucosa normal, no drainage    or sinus tenderness  Throat:   Lips, mucosa, and tongue normal; teeth and gums normal mild post nasal drip  Neck:   Supple, symmetrical, trachea midline, no adenopathy;    thyroid:  no enlargement/tenderness/nodules;      Lungs:     Clear to auscultation bilaterally, respirations unlabored      Heart:    Regular rate and rhythm, S1 and S2 normal, no murmur, rub   or gallop                                 Assessment & Plan:

## 2010-03-29 NOTE — Patient Instructions (Signed)
I want you to take this antibiotic called azithromax.  Take 1 pill daily for three days I want you to stop your nose spray for 2 days, then only do 1 spray each nostril twice daily I want you to stop allegra Try zyrtec 10mg  at night to help with allergies.

## 2010-03-30 ENCOUNTER — Telehealth: Payer: Self-pay | Admitting: Family Medicine

## 2010-03-30 NOTE — Telephone Encounter (Signed)
Spoke with patient and suggested throat lozenges such as Sucrets,  ibuproefn or tylenol for discomfort, salt water gargle. Also suggested a cool mist humifier might also help .

## 2010-04-01 ENCOUNTER — Telehealth: Payer: Self-pay | Admitting: Family Medicine

## 2010-04-01 NOTE — Telephone Encounter (Signed)
Requesting referral to ENT, pt seen at neurologist & they suggested ENT.

## 2010-04-02 NOTE — Telephone Encounter (Signed)
Please clarify - she told me at our last visit that she has already seen ENT. Why hasn't the Neurologist referred her to ENT?

## 2010-04-05 NOTE — Telephone Encounter (Signed)
Pt has [employer], per [employer], pcp must do the referrals.

## 2010-04-06 NOTE — Telephone Encounter (Signed)
The patient told me at her last visit that she has already seen ENT. Please clarify. Also, I want to review records from her Neurologist - please ask her to come back and sign a record release.

## 2010-04-06 NOTE — Telephone Encounter (Signed)
Called pt, scheduled an appt to discuss referral & MD can have release form signed to get records for neurologist.

## 2010-04-09 NOTE — Telephone Encounter (Signed)
To red team for review

## 2010-04-16 ENCOUNTER — Ambulatory Visit: Payer: Medicaid Other | Admitting: Family Medicine

## 2010-05-03 ENCOUNTER — Ambulatory Visit: Payer: Medicaid Other | Admitting: Family Medicine

## 2010-05-24 ENCOUNTER — Inpatient Hospital Stay (INDEPENDENT_AMBULATORY_CARE_PROVIDER_SITE_OTHER)
Admission: RE | Admit: 2010-05-24 | Discharge: 2010-05-24 | Disposition: A | Discharge status: Home / Self Care | Payer: Medicaid Other | Source: Ambulatory Visit | Attending: Family Medicine | Admitting: Family Medicine

## 2010-05-24 DIAGNOSIS — J069 Acute upper respiratory infection, unspecified: Secondary | ICD-10-CM

## 2010-05-24 DIAGNOSIS — J309 Allergic rhinitis, unspecified: Secondary | ICD-10-CM

## 2010-05-24 LAB — POCT RAPID STREP A (OFFICE): Streptococcus, Group A Screen (Direct): NEGATIVE

## 2010-06-07 ENCOUNTER — Ambulatory Visit: Payer: Medicaid Other | Admitting: Family Medicine

## 2010-06-08 ENCOUNTER — Encounter: Payer: Self-pay | Admitting: Family Medicine

## 2010-06-08 ENCOUNTER — Ambulatory Visit (INDEPENDENT_AMBULATORY_CARE_PROVIDER_SITE_OTHER): Payer: Medicaid Other | Admitting: Family Medicine

## 2010-06-08 VITALS — BP 115/78 | HR 104 | Temp 98.1°F | Wt 139.0 lb

## 2010-06-08 DIAGNOSIS — R05 Cough: Secondary | ICD-10-CM | POA: Insufficient documentation

## 2010-06-08 DIAGNOSIS — K219 Gastro-esophageal reflux disease without esophagitis: Secondary | ICD-10-CM

## 2010-06-08 DIAGNOSIS — J301 Allergic rhinitis due to pollen: Secondary | ICD-10-CM

## 2010-06-08 DIAGNOSIS — J029 Acute pharyngitis, unspecified: Secondary | ICD-10-CM

## 2010-06-08 NOTE — Assessment & Plan Note (Signed)
Likely contributing to symptoms of sore throat as well. Advised to restart Flonase and continue Claritin. Follow up with PCP.

## 2010-06-08 NOTE — Patient Instructions (Signed)
It was great to meet you today! You DO NOT have any signs of infection in your throat today I think your sore throat is coming from two causes - 1) Heartburn (TAKE Prilosec OTC as directed and avoid greasy, spicy foods and alcohol) 2) Allergies (START taking your Flonase and continue your Claritin.   Follow up with Dr. Earlene Plater in one month if symptoms not improved - REMEMBER TO TAKE THE MEDICATIONS ABOVE TO HELP  - Dr. Wallene Huh

## 2010-06-08 NOTE — Assessment & Plan Note (Signed)
Likely contributing to sore throat. Advised to start Prilosec as directed. Advised to avoid possible triggers including spicy foods, alcohol. Follow up with PCP.

## 2010-06-08 NOTE — Progress Notes (Signed)
  Subjective:    Patient ID: Tasha Fernandez, female    DOB: Dec 09, 1970, 40 y.o.   MRN: [id number]  HPI 1) Sore throat: x 16 days. Feels "burning sensation in throat". Worse at night and first thing in morning. Not affected by foods. Has history of heartburn and has been prescribed Prilosec OTC which she has not been taking. Denies recent heartburn symptoms except "when [she] drink[s] a certain type of alcohol". Reports some runny nose and sinus pressure and ear pressure and itchy eyes and mild non-productive cough - reports history of allergic rhinitis but is not taking her Flonase or Zyrtec - instead she has been taking Vicks Allergy and Sinus Relief (which helped but she stopped taking that as well) and Claritin (which she started today). Denies lymphadenopathy, fatigue, weight loss, emesis, abdominal Fernandez, nausea, rash, fever, dyspnea, wheezing , chest Fernandez, neck swelling.   2) STD testing: Patient would like to have HIV testing today. Denies specific exposure, vaginal discharge, vaginal lesions. Reports sore throat as above.  Denies lymphadenopathy, fatigue, weight loss, emesis, abdominal Fernandez, nausea, rash, fever, dyspnea, wheezing , chest Fernandez, neck swelling.    Review of Systems As per HPI     Objective:   Physical Exam  Constitutional: She appears well-developed and well-nourished.       Perseverating on possible causes of sore throat  HENT:  Head: Normocephalic and atraumatic.  Right Ear: External ear normal.  Left Ear: External ear normal.  Nose: Nose normal. No mucosal edema or rhinorrhea. Right sinus exhibits no maxillary sinus tenderness and no frontal sinus tenderness. Left sinus exhibits no maxillary sinus tenderness and no frontal sinus tenderness.  Mouth/Throat: Uvula is midline, oropharynx is clear and moist and mucous membranes are normal. Mucous membranes are not pale and not dry. No oral lesions. No uvula swelling. No oropharyngeal exudate, posterior oropharyngeal edema,  posterior oropharyngeal erythema or tonsillar abscesses.  Eyes: Conjunctivae are normal. Right eye exhibits no discharge. Left eye exhibits no discharge.  Neck: Normal range of motion. Neck supple. No thyromegaly present.  Cardiovascular: Normal rate and regular rhythm.   Pulmonary/Chest: Effort normal and breath sounds normal. No respiratory distress. She has no wheezes.  Abdominal: Soft. Bowel sounds are normal. She exhibits no distension. There is no tenderness.  Lymphadenopathy:    She has no cervical adenopathy.          Assessment & Plan:

## 2010-06-16 ENCOUNTER — Telehealth: Payer: Self-pay | Admitting: Family Medicine

## 2010-06-16 NOTE — Telephone Encounter (Signed)
LVOM to let patient know that testing was negative.

## 2010-07-01 ENCOUNTER — Ambulatory Visit: Payer: Medicaid Other

## 2010-07-02 ENCOUNTER — Encounter: Payer: Self-pay | Admitting: Family Medicine

## 2010-07-02 ENCOUNTER — Ambulatory Visit (INDEPENDENT_AMBULATORY_CARE_PROVIDER_SITE_OTHER): Payer: Medicaid Other | Admitting: Family Medicine

## 2010-07-02 DIAGNOSIS — M26629 Arthralgia of temporomandibular joint, unspecified side: Secondary | ICD-10-CM

## 2010-07-02 DIAGNOSIS — H60399 Other infective otitis externa, unspecified ear: Secondary | ICD-10-CM

## 2010-07-02 DIAGNOSIS — H6090 Unspecified otitis externa, unspecified ear: Secondary | ICD-10-CM | POA: Insufficient documentation

## 2010-07-02 DIAGNOSIS — J301 Allergic rhinitis due to pollen: Secondary | ICD-10-CM

## 2010-07-02 MED ORDER — MONTELUKAST SODIUM 10 MG PO TABS: 10.0000 mg | ORAL_TABLET | Freq: Every day | ORAL | Status: DC

## 2010-07-02 MED ORDER — NEOMYCIN-POLYMYXIN-HC 3.5-10000-1 OT SUSP: 4.0000 [drp] | Freq: Four times a day (QID) | OTIC | Status: AC

## 2010-07-02 NOTE — Progress Notes (Signed)
  Subjective:    Patient ID: Tasha Fernandez, female    DOB: [birth year], 40 y.o.   MRN: [id number]  HPI 1.  Ear pain:  Present for 3-4 weeks.  Has been using cotton balls and several differnet OTC ear drops without relief.  Has tried pouring warm oil into her ear for relief, also has tried using hydrogen peroxide, all without relief.  Denies any ear drainage.  Complains of severe ear pain, sometimes awakens her from sleep.    2.  Allergies:  Has been complaining of rhinitis, sore throat, runny eyes for "years."  Has tried multiple OTC medications including Zyrtec, Allegra, Claritin as well as -D formulations.  These work for short period of time but not for any longer than a week.  Is now miserable due to symptoms.  Has seen over 13 doctors in past 2 years from here to [facility] with both allergies and TMJ, see below.  No recent illnesses.    3.  TMJ:  Long-time problem for patient.  Has been seen by ENT as well as TMJ specialist, who first told her it was TMJ and then later told her he wasn't sure.  Seen by neurologist for trigemenal neuralgia, placed on Tegretol, no relief, taken off, told it wasn't Trigeminal neuralgia.  Spontaneously regressed, but has recurred since ear pain began 3-4 weeks ago.  Pain with chewing, opening her mouth, speaking.  Does not radiate, mostly just over TMJ joint.  Describes as sharp stabbing pain.     Review of Systems See HPI above for review of systems.       Objective:   Physical Exam Gen:  Alert, cooperative patient who appears stated age in no acute distress.  Vital signs reviewed. HEENT:  [place]/AT.  EOMI, PERRL. No conjunctival erythema.  BL TM's pearly gray without redness or bulging.  Left ear canal with mod-severe erythema and edema.  Right ear canal with minimal erythema.  Pain on palpation of pinna and pre-auricles BL.  Pain over TMJ joint BL when opening mouth. MMM, tonsils non-erythematous, non-edematous.   Pulm:  Clear to auscultation bilaterally with  good air movement.  No wheezes or rales noted.   Cardiac:  Regular rate and rhythm without murmur auscultated.  Good S1/S2. Skin - no sores or suspicious lesions or rashes or color changes  Psych:  Very pressured speech, very tangential.  Hard to get a word in edgewise with her       Assessment & Plan:

## 2010-07-02 NOTE — Patient Instructions (Signed)
Use the cortisporin otic 4 drops 3 times a day for 10 days.  Do not use for longer.  This has a steroid to decrease inflammation, a pain reliever, and an antibiotic.  We will send in for Singulair, which is a prescription strength allergy medicine.  It takes several days before you will notice an effect, so make sure you take it everyday. We will refer you to an allergist as you've been having so much trouble for so long.

## 2010-07-02 NOTE — Assessment & Plan Note (Signed)
Has failed Zyrtec, FLonase, Singulair, Allegra. Will attempt Singulair.  Likely will need to complete prior auth. If not working, will refer to allergist as this is long-standing problem.

## 2010-07-02 NOTE — Assessment & Plan Note (Signed)
No changes to medications today. To continue Ibuprofen.  Possibly triggered/worsened by otitis externa.  Hopeful this will clear after she otitis externa improves. Recommended she wear mouth guard.

## 2010-07-02 NOTE — Assessment & Plan Note (Signed)
Cortisporin otic to treat.  Hopeful this will improve symptoms.

## 2010-07-07 ENCOUNTER — Telehealth: Payer: Self-pay | Admitting: *Deleted

## 2010-07-07 NOTE — Telephone Encounter (Signed)
PA required for Singulair. PA form placed in MD box.

## 2010-07-09 NOTE — Telephone Encounter (Signed)
Filled out Prior Auth - patient has documented failure of all three OTC non-sedating antihistamines as well as inhaled corticosteroid.

## 2010-07-12 NOTE — Telephone Encounter (Signed)
Approval received from [facility] for Singulair . Pharmacy notified.

## 2010-07-26 ENCOUNTER — Telehealth: Payer: Self-pay | Admitting: Family Medicine

## 2010-07-26 ENCOUNTER — Emergency Department (HOSPITAL_COMMUNITY)
Admission: EM | Admit: 2010-07-26 | Discharge: 2010-07-27 | Disposition: A | Discharge status: Home / Self Care | Payer: Medicaid Other | Attending: Emergency Medicine | Admitting: Emergency Medicine

## 2010-07-26 DIAGNOSIS — K219 Gastro-esophageal reflux disease without esophagitis: Secondary | ICD-10-CM | POA: Insufficient documentation

## 2010-07-26 DIAGNOSIS — M26609 Unspecified temporomandibular joint disorder, unspecified side: Secondary | ICD-10-CM | POA: Insufficient documentation

## 2010-07-26 DIAGNOSIS — R6884 Jaw pain: Secondary | ICD-10-CM | POA: Insufficient documentation

## 2010-07-26 NOTE — Telephone Encounter (Signed)
Tasha Fernandez need to have a referral to chiropractor in [place] for her TMJ.  She would need it faxed to [phone]  Ph# [phone]

## 2010-07-27 NOTE — Telephone Encounter (Signed)
Will fwd. To PCP for review .Thomasena Vandenheuvel  

## 2010-07-27 NOTE — Telephone Encounter (Signed)
Pt calling again and needs this referral ASAP.  She is in a great deal of pain and needs to go to this therapist tomorrow.

## 2010-07-27 NOTE — Telephone Encounter (Signed)
Tasha Fernandez had to go to the ED yesterday due to pain.  She want to have this referral sent asap.   Please let her know when this has been done so she can make her an appt.

## 2010-07-28 ENCOUNTER — Encounter: Payer: Self-pay | Admitting: *Deleted

## 2010-07-28 NOTE — Telephone Encounter (Signed)
Patient returned call, said she has already spoken with the office in [facility] and they do require a referral from [place]. Will call their office to try and schedule appointment.Busick, Rodena Medin

## 2010-07-28 NOTE — Telephone Encounter (Signed)
Called the office in [place], they do require referral. Will fax to [phone] and they will contact patient with appointment.Busick, Rodena Medin

## 2010-07-28 NOTE — Telephone Encounter (Signed)
Left message for patient to return call. If patient is in pain she needs to schedule appointment to be seen here. If she has chiropractor she prefers to see she needs to call them and schedule appointment. If they require referral we can put in letter.Busick, Rodena Medin

## 2010-07-28 NOTE — Telephone Encounter (Signed)
Chiropractor referral is not urgent or emergent. If patient is in that much pain, she should make an appointment. We also have no connection to chiropractor in [place] - patient to call them directly.

## 2010-08-19 ENCOUNTER — Encounter: Payer: Self-pay | Admitting: Family Medicine

## 2010-08-19 NOTE — Progress Notes (Signed)
Tasha Fernandez is seeing a [facility] for management of neck, and jaw pain and headaches. 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

## 2010-08-26 ENCOUNTER — Telehealth: Payer: Self-pay | Admitting: Family Medicine

## 2010-08-26 ENCOUNTER — Ambulatory Visit (INDEPENDENT_AMBULATORY_CARE_PROVIDER_SITE_OTHER): Payer: Medicaid Other | Admitting: Family Medicine

## 2010-08-26 ENCOUNTER — Encounter: Payer: Self-pay | Admitting: Family Medicine

## 2010-08-26 VITALS — BP 115/80 | HR 103 | Temp 96.8°F | Ht 65.6 in | Wt 131.0 lb

## 2010-08-26 DIAGNOSIS — R51 Headache: Secondary | ICD-10-CM

## 2010-08-26 MED ORDER — MELOXICAM 7.5 MG PO TABS: 7.5000 mg | ORAL_TABLET | Freq: Every day | ORAL | Status: DC

## 2010-08-26 NOTE — Telephone Encounter (Signed)
Pt needs rx for mobic faxed to [facility] not electronically sent, pharmacy has had trouble with system today.

## 2010-08-26 NOTE — Patient Instructions (Signed)
Thank you for coming in today. Take the meloxicam 1-2 pills a day for jaw pain and headache.  Go to the neurology visit.

## 2010-08-26 NOTE — Telephone Encounter (Signed)
Pt calling back, would like [place] to call her once its taken care of, needs today for migraines.

## 2010-08-26 NOTE — Telephone Encounter (Signed)
Called and lvm for pt concerning appt with [facility] for 11.2.2012 @ 130 pm. They have placed pt on cancellation list should anything become available.Laureen Ochs, Viann Shove

## 2010-08-26 NOTE — Telephone Encounter (Signed)
Called in mobic to pt's pharmacy. Lorenda Hatchet, Yuvia Plant Called pt. Left message 'meds called in' .Arlyss Repress

## 2010-08-27 ENCOUNTER — Telehealth: Payer: Self-pay | Admitting: Family Medicine

## 2010-08-27 ENCOUNTER — Encounter: Payer: Self-pay | Admitting: Family Medicine

## 2010-08-27 NOTE — Telephone Encounter (Signed)
Routed to [facility] [address]

## 2010-08-27 NOTE — Progress Notes (Signed)
Ms. Lohr presents to clinic today with headache for 2 months.  She notes that in the past she has been diagnosed with bilateral TMJ. Her TMJ is currently being treated by a chiropractor however she is still symptomatic daily. Her current headache is bitemporal bandlike and throbbing. She's taking NSAIDs which have not provided much relief. She does however have a phone number to a neurologist in [place] that she would like referral to for management of her headache. She is very overwhelmed by her chronic daily headache and wishes for further treatment. Additionally in the past she has presented to the emergency room where she received injections that helped her headaches but caused nausea and sedation and she does not wish for that again today. She has never had migraine prophylaxis or specific medications for migraine such as Imitrex. She denies any vision changes or focal weakness numbness or clumsiness. She additionally denies any daytime sleepiness or snoring.  PMH reviewed.  ROS as above otherwise neg  Exam:  Vs noted.  Gen: Well NAD HEENT: EOMI, PERRL,  funduscopic exam is normal with crisp disc margins bilaterally and normal optic disc to cup ratio is no venous congestion is noted .  MMM Lungs: CTABL Nl WOB Heart: RRR no MRG Abd: NABS, NT, ND Exts: Non edematous BL  LE Neuro: Cranial nerves II through XII are intact coordination sensation strength and reflexes are all normal gait is normal Psychiatric: Tearful throughout the exam however affect is relatively normal patient's judgment appears to be intact no suicidal or homicidal ideation.

## 2010-08-27 NOTE — Telephone Encounter (Signed)
Left message for patient to return call. We do not have any work in appointments available, if she is in that much pain she needs to go to urgent care.Busick, Rodena Medin

## 2010-08-27 NOTE — Assessment & Plan Note (Signed)
I suspect that Mrs. Merkey's headaches are multifactorial area and she certainly has some aspect of TMJ pain however I also suspect that she may have migraine or medication overuse headache.  She does not have any red flag signs or symptoms for tumor or other deadly etiology.   Plan: Please refer to neurologist as patient requests feel like this is a good idea.  We'll also use Mobic initially for one-month in hopes to better control TMJ pain as this may help her headache significantly.  If pain is still unbearable in one to 2 weeks we'll see back in clinic and initiate headache prophylactics with either a beta blocker or or Topamax.  We'll follow closely.

## 2010-08-27 NOTE — Telephone Encounter (Signed)
Left message with patient to return call. Appointment was scheduled at [facility] per her request. I can try to get her an earlier appointment with [facility], but if she wants to stay with [facility] she will have to wait until November unless they have a cancellation, she is on the list there.Busick, Rodena Medin

## 2010-08-27 NOTE — Telephone Encounter (Signed)
Rx called in. Thanks, Clayburn Pert

## 2010-08-27 NOTE — Telephone Encounter (Signed)
Patient has called back in severe pain and is now saying she wants to talk to someone about a referral for an oral surgeon.  She has TMJ but she doesn't know if that is causing her pain.  She states again that her pain is severe and wants to speak with someone immediately.  She isn't being rude, she just wants it to be know that this is serious.

## 2010-08-27 NOTE — Telephone Encounter (Signed)
Tasha Fernandez called to find out when her appt for Neurologist is.  I told her it is set for 11/2.  The patient began to cry stating she can't take the headaches anymore.  She wants to see if the appt can be moved up.  There was no phone number in the Encounter to give to her.  She would like to speak with someone as soon as possible.

## 2010-09-08 ENCOUNTER — Telehealth: Payer: Self-pay | Admitting: Family Medicine

## 2010-09-08 NOTE — Telephone Encounter (Signed)
Patient is requesting flexeril for TMJ, I do not see that she is currently on it at this time. Will forward to PCP for review.

## 2010-09-08 NOTE — Telephone Encounter (Signed)
Pt is requesting muscle relaxer for her TMJ and is causing pain- flexeril [facility] [address]

## 2010-09-09 MED ORDER — CYCLOBENZAPRINE HCL 10 MG PO TABS: 10.0000 mg | ORAL_TABLET | Freq: Three times a day (TID) | ORAL | Status: DC | PRN

## 2010-09-09 NOTE — Telephone Encounter (Signed)
Sent electronically 

## 2010-09-09 NOTE — Telephone Encounter (Signed)
Left message for patient to return call. Please tell her the flexeril was sent to her pharmacy.Carah Barrientes, Rodena Medin

## 2010-09-23 ENCOUNTER — Encounter: Payer: Self-pay | Admitting: Family Medicine

## 2010-09-23 ENCOUNTER — Telehealth: Payer: Self-pay | Admitting: Family Medicine

## 2010-09-23 ENCOUNTER — Ambulatory Visit (INDEPENDENT_AMBULATORY_CARE_PROVIDER_SITE_OTHER): Payer: Medicaid Other | Admitting: Family Medicine

## 2010-09-23 VITALS — BP 123/88 | HR 112 | Temp 98.3°F | Ht 65.5 in | Wt 137.1 lb

## 2010-09-23 DIAGNOSIS — M26629 Arthralgia of temporomandibular joint, unspecified side: Secondary | ICD-10-CM

## 2010-09-23 DIAGNOSIS — R51 Headache: Secondary | ICD-10-CM

## 2010-09-23 DIAGNOSIS — M2669 Other specified disorders of temporomandibular joint: Secondary | ICD-10-CM

## 2010-09-23 MED ORDER — GABAPENTIN 100 MG PO TABS: 100.0000 mg | ORAL_TABLET | Freq: Three times a day (TID) | ORAL | Status: DC

## 2010-09-23 MED ORDER — CYCLOBENZAPRINE HCL 10 MG PO TABS: 10.0000 mg | ORAL_TABLET | Freq: Three times a day (TID) | ORAL | Status: DC | PRN

## 2010-09-23 NOTE — Progress Notes (Signed)
  Subjective:    Patient ID: Tasha Fernandez, female    DOB: 1970/05/06, 40 y.o.   MRN: [id number]  HPI 1.  Headache:  Long-time problem for patient.  Under control last year, but worsened since Spring/summer of 2012.  Has seen many different doctors for this, diagnosed with both TMJ and trigeminal neuralgia in past.  Causes significant distress and discomfort in her life.  Seen at [facility] about 3 weeks ago, plan is for trial of botox injections later this month on August 30.  Has been on Tegretol in past, which she states actually helped for about a year or so.  Started sometime in 2011, patient unclear exactly when, but it was earlier that year.  Pain came back this year when she began chewing gum again, had stopped due to pain.  Now pain is severe, described as temporal-parietal pain.  Has had multiple imaging studies of head, all which have been negative.  Flexeril has also helped with pain in past.  Describes aching pressure in head.  No fevers, chills, changes in weight, night sweats, abd pain, N/V.   Review of Systems See HPI above for review of systems.       Objective:   Physical Exam Gen:  Alert, cooperative patient who appears stated age.  Tearful.  Mild distress.  Has cotton balls in ears.  Somewhat disheveled appearing.  Vital signs reviewed. Head:  [place]/AT Eyes:  Fundoscopic exam WNL bilaterally.  Good cup-to-disc margins.  No papilledema appreciated Nose:  Mildly prominent nasal turbinates BL Mouth: MMM, oropharynx pink, no tonsillar edema Neck: No masses or thyromegaly or limitation in range of motion.  No cervical lymphadenopathy.  MSK:  Some tightness appreciated BL trapezius muscles.  Left trapezius muscle appears to be in spasm, multiple trigger points noted.   Psych:  Depressed appearing, tearful.  No SI/HI      Assessment & Plan:

## 2010-09-23 NOTE — Telephone Encounter (Signed)
PT need rx called for flexeril and neurontin for pain

## 2010-09-24 ENCOUNTER — Ambulatory Visit: Payer: Medicaid Other | Admitting: Family Medicine

## 2010-09-25 NOTE — Assessment & Plan Note (Addendum)
Patient asking for Neurontin today as she has heard this is similar to Tegretol.  Discussed this at length with her.  Tegretol has helped her in past but she is afraid it won't work anymore and she will be stuck without any medication at all.   Discussed plan for now is to start low-dose Neurontin and this will need upward titration by PCP.  Recommended she continue therapeutic relationship with her PCP, it's better to have one doctor that knows her well.  Patient expressed understanding.  Discussed with her that this is a chronic medication, I will not refill it for her, and that she must follow-up with PCP for discussion to try this or another medication.   Refill Flexeril for neck spasm -- likely secondary to pain and tension held in her neck and not true cause of her headaches. No red flags by exam or history today.  She is to FU with PCP after botox injections at end of month to discuss further options with him.    Also, patient appears depressed.  I wonder if SSRI or another anti-depressive might help with her undefined chronic pain.   Over 40 minutes spent in patient care, including 30 minutes face to face time with patient.

## 2010-10-19 ENCOUNTER — Other Ambulatory Visit: Payer: Self-pay | Admitting: Family Medicine

## 2010-10-19 NOTE — Telephone Encounter (Signed)
Is asking for refill on her neurontin & valium [facility] [address]

## 2010-10-19 NOTE — Telephone Encounter (Signed)
Will forward to Dr Corey 

## 2010-10-20 MED ORDER — GABAPENTIN 100 MG PO CAPS: 100.0000 mg | ORAL_CAPSULE | Freq: Three times a day (TID) | ORAL | Status: DC

## 2010-10-20 NOTE — Telephone Encounter (Signed)
.   For Valium refill she'll have to present to clinic for physician evaluation. I will refill Neurontin today.

## 2010-10-27 ENCOUNTER — Emergency Department (HOSPITAL_COMMUNITY)
Admission: EM | Admit: 2010-10-27 | Discharge: 2010-10-27 | Disposition: A | Discharge status: Home / Self Care | Payer: Medicaid Other | Attending: Emergency Medicine | Admitting: Emergency Medicine

## 2010-10-27 DIAGNOSIS — R6884 Jaw pain: Secondary | ICD-10-CM | POA: Insufficient documentation

## 2010-11-04 LAB — CULTURE, ROUTINE-ABSCESS: Gram Stain: NONE SEEN

## 2010-11-05 ENCOUNTER — Ambulatory Visit: Payer: Medicaid Other

## 2010-11-08 ENCOUNTER — Ambulatory Visit: Payer: Medicaid Other | Admitting: Sports Medicine

## 2010-11-16 ENCOUNTER — Encounter: Payer: Self-pay | Admitting: Family Medicine

## 2010-11-16 ENCOUNTER — Ambulatory Visit (INDEPENDENT_AMBULATORY_CARE_PROVIDER_SITE_OTHER): Payer: Medicaid Other | Admitting: Family Medicine

## 2010-11-16 VITALS — BP 130/80 | HR 90 | Wt 138.0 lb

## 2010-11-16 DIAGNOSIS — M26629 Arthralgia of temporomandibular joint, unspecified side: Secondary | ICD-10-CM

## 2010-11-16 MED ORDER — GABAPENTIN 300 MG PO CAPS: 300.0000 mg | ORAL_CAPSULE | Freq: Three times a day (TID) | ORAL | Status: DC

## 2010-11-16 MED ORDER — CYCLOBENZAPRINE HCL 10 MG PO TABS: 10.0000 mg | ORAL_TABLET | Freq: Three times a day (TID) | ORAL | Status: DC | PRN

## 2010-11-16 NOTE — Patient Instructions (Addendum)
Thank you for coming in today. I increased your gabapentin to 300 three times a day.  Start taking 300mg  at night and 100mg  breakfast and lunch. I a few days take 300 at night and at lunch and 100 at breakfast.  In a week or so you should be taking 300 three times a day.  I refilled flexeril for spasm.  See me after you see the neurologist at [facility]. TMJ Problems  (Temporal Mandibular Joint Dysfunction) TMJ dysfunction means there are problems with the joint between your jaw and your skull. This is a joint lined by cartilage like other joints in your body but also has a small disc in the joint which keeps the bones from rubbing on each other. These joints are like other joints and can get inflamed (sore) from arthritis and other problems. When this joint gets sore, it can cause headaches and pain in the jaw and the face. CAUSES Usually the arthritic types of problems are caused by soreness in the joint. Soreness in the joint can also be caused by overuse. This may come from grinding your teeth. It may also come from mis-alignment in the joint. DIAGNOSIS (LEARNING WHAT IS WRONG) Diagnosis of this condition can often be made by history and exam. Sometimes your caregiver may need X-rays or an MRI scan to determine the exact cause. It may be necessary to see your dentist to determine if your teeth and jaws are lined up correctly. TREATMENT Most of the time this problem is not serious; however, sometimes it can persist (become chronic). When this happens medications that will cut down on inflammation (soreness) help. Sometimes a shot of cortisone into the joint will be helpful. If your teeth are not aligned it may help for your dentist to make a splint for your mouth that can help this problem. If no physical problems can be found, the problem may come from tension. If tension is found to be the cause, biofeedback or relaxation techniques may be helpful. HOME CARE INSTRUCTIONS  Later in the day,  applications of ice packs may be helpful. Ice can be used in a plastic bag with a towel around it to prevent frostbite to skin. This may be used about every 2 hours for 20 to 30 minutes, as needed while awake, or as directed by your caregiver.   Only take over-the-counter or prescription medicines for pain, discomfort, or fever as directed by your caregiver.   If physical therapy was prescribed, follow your caregiver's directions.   Wear mouth appliances as directed if they were given.  Document Released: 10/19/2000 Document Re-Released: 04/22/2008 [facility] Patient Information 2011 [place], [place].

## 2010-11-16 NOTE — Progress Notes (Signed)
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 In the interval she has been seen by neurology at [facility] in [place] for this very issue. She is receiving Botox injections for management of her pain. This has worked well and caused a significant reduction of her overall pain.  Her next visit is at the end of November.  Today however she notes continued bilateral TMJ pain and requests Flexeril for muscle spasm.  This has worked in the past and she no longer has any tablets.  Additionally she notes that gabapentin which was started several months ago has helped some.  She denies any weakness numbness or lack of coordination in her extremities.  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   PMH reviewed.  ROS as above otherwise neg Medications reviewed.  Exam:  BP 130/80  Pulse 90  Wt 138 lb (62.596 kg)  LMP 11/04/2010 Gen: Well NAD TMJ: No click with opening of the jaw. However pain when palpating open TMJs bilaterally.  Mouth has no dental caries apparent otherwise face and jaw are normal appearing.

## 2010-11-16 NOTE — Assessment & Plan Note (Signed)
Continuation of chronic problem. This is being managed by neurology at [facility]. Plan to refill Flexeril and increase gabapentin.  We'll followup in 2 months or sooner as needed.

## 2010-12-21 ENCOUNTER — Telehealth: Payer: Self-pay | Admitting: Family Medicine

## 2010-12-21 NOTE — Telephone Encounter (Signed)
Spoke with Dr. Denyse Amass who is willing to refill her Flexeril and Gabapentin.  He also recommends that she call her neurologist who does her Botox injections to see if he/she has any recommendations.  Will call patient and let her know Dr. Denyse Amass will send in refills this afternoon.

## 2010-12-21 NOTE — Telephone Encounter (Signed)
Having severe pain with her TMJ and is wanting something called in - wants to talk to nurse

## 2010-12-21 NOTE — Telephone Encounter (Signed)
Patient with severe jaw pain due to TMJ.

## 2010-12-21 NOTE — Telephone Encounter (Signed)
Calling back, wants to know if MD can increase the dosage of flexeril, says she is in severe pain due to the TMJ.

## 2010-12-22 ENCOUNTER — Other Ambulatory Visit: Payer: Self-pay | Admitting: Family Medicine

## 2010-12-22 DIAGNOSIS — M26629 Arthralgia of temporomandibular joint, unspecified side: Secondary | ICD-10-CM

## 2010-12-22 MED ORDER — GABAPENTIN 300 MG PO CAPS: 300.0000 mg | ORAL_CAPSULE | Freq: Three times a day (TID) | ORAL | Status: DC

## 2010-12-22 MED ORDER — CYCLOBENZAPRINE HCL 10 MG PO TABS: 10.0000 mg | ORAL_TABLET | Freq: Three times a day (TID) | ORAL | Status: DC | PRN

## 2010-12-22 NOTE — Telephone Encounter (Signed)
Tasha Fernandez is calling to find out if her Rx's for Neurontin and Flexeril have been sent to [facility] yet.  I did not see where this has happened.

## 2010-12-22 NOTE — Telephone Encounter (Signed)
Forward to PCP for refill request 

## 2011-01-07 ENCOUNTER — Ambulatory Visit (INDEPENDENT_AMBULATORY_CARE_PROVIDER_SITE_OTHER): Payer: Medicaid Other | Admitting: Family Medicine

## 2011-01-07 VITALS — BP 137/94 | HR 103 | Ht 65.5 in | Wt 139.0 lb

## 2011-01-07 DIAGNOSIS — M26629 Arthralgia of temporomandibular joint, unspecified side: Secondary | ICD-10-CM

## 2011-01-07 MED ORDER — DULOXETINE HCL 30 MG PO CPEP: 30.0000 mg | ORAL_CAPSULE | Freq: Every day | ORAL | Status: DC

## 2011-01-07 NOTE — Assessment & Plan Note (Signed)
Very difficult to control despite multiple different efforts. Plan to add Cymbalta for treatment of his chronic pain. Additionally encouraged patient to keep her oral surgery appointment for consideration of surgery. We'll followup in one month. Additionally we'll obtain rheumatoid factor and ANA as patient desires this labs.

## 2011-01-07 NOTE — Patient Instructions (Signed)
Thank you for coming in today. I am doing blood work for Rheumatoid arthritis and lupus, but these will be negative likely.  I am also starting cymbalta for your pain.  Take 1 pill daily then in two weeks start taking 2 pills.  Come back and see me in 1 month.  If you feel worse or feel like hurting yourself or others let me know ASAP.

## 2011-01-07 NOTE — Progress Notes (Signed)
Ms. Tasha Fernandez presents to clinic today to followup her bilateral TMJ pain. She has seen over 20 doctors for this issue in the past 18 months has tried multiple different therapies. Currently she seen a neurologist in [place] who is performing Botox injections which does help some. Additionally she has a oral surgery appointment on Monday for this issue as well. She wonders if there is any other explanation for the cause of her recalcitrant pain. She's concerned that she may have rheumatoid arthritis or some other explanation for her pain. She notes constant pain along her TMJ but does radiate to her lower jaw and maxilla.  She thinks that she may additionally have fibromyalgia it may be contributing to her pain.  She has been treated for trigeminal neuralgia in the past with Tegretol but this was not effective.  PMH reviewed.  ROS as above otherwise neg Medications reviewed. Current Outpatient Prescriptions  Medication Sig Dispense Refill  . cyclobenzaprine (FLEXERIL) 10 MG tablet Take 1 tablet (10 mg total) by mouth every 8 (eight) hours as needed for muscle spasms.  90 tablet  3  . gabapentin (NEURONTIN) 300 MG capsule Take 1 capsule (300 mg total) by mouth 3 (three) times daily.  90 capsule  6  . cetirizine (ZYRTEC) 10 MG tablet Take 1 tablet (10 mg total) by mouth daily.  30 tablet  2  . DULoxetine (CYMBALTA) 30 MG capsule Take 1 capsule (30 mg total) by mouth daily. 1 pill daily x 2 weeks then 2 pills daily  45 capsule  2  . fluticasone (FLONASE) 50 MCG/ACT nasal spray by Nasal route. 2 squirts in each nostril daily, than after one week just one squirt in each nostril       . meloxicam (MOBIC) 7.5 MG tablet Take 1 tablet (7.5 mg total) by mouth daily.  30 tablet  0  . montelukast (SINGULAIR) 10 MG tablet Take 1 tablet (10 mg total) by mouth daily.  30 tablet  2  . omeprazole (PRILOSEC OTC) 20 MG tablet Take 20 mg by mouth daily.          Exam:  BP 137/94  Pulse 103  Ht 5' 5.5" (1.664 m)   Wt 139 lb (63.05 kg)  BMI 22.78 kg/m2 Gen: Well NAD, anxious and uncomfortable appearing Face: Pain along the TMJ with mouth closed and open. Pain on mouth range of motion.

## 2011-01-10 ENCOUNTER — Encounter: Payer: Self-pay | Admitting: Family Medicine

## 2011-01-12 ENCOUNTER — Telehealth: Payer: Self-pay | Admitting: Family Medicine

## 2011-01-12 NOTE — Telephone Encounter (Signed)
Pt need another rx sent to pharmacy for her Flexerill.  Lost the bottle she had.

## 2011-01-13 ENCOUNTER — Ambulatory Visit (INDEPENDENT_AMBULATORY_CARE_PROVIDER_SITE_OTHER): Payer: Medicaid Other | Admitting: Family Medicine

## 2011-01-13 ENCOUNTER — Encounter: Payer: Self-pay | Admitting: Family Medicine

## 2011-01-13 DIAGNOSIS — M9981 Other biomechanical lesions of cervical region: Secondary | ICD-10-CM

## 2011-01-13 DIAGNOSIS — M26629 Arthralgia of temporomandibular joint, unspecified side: Secondary | ICD-10-CM

## 2011-01-13 DIAGNOSIS — M999 Biomechanical lesion, unspecified: Secondary | ICD-10-CM | POA: Insufficient documentation

## 2011-01-13 NOTE — Progress Notes (Signed)
  Subjective:    Patient ID: Tasha Fernandez, female    DOB: [birth year], 40 y.o.   MRN: [id number]  HPI Review of Systems    Objective:   Physical Exam    Assessment & Plan:  Tasha Fernandez presents to clinic today to followup her bilateral TMJ pain. Today only her left side is hurting her.  She has seen over 20 doctors for this issue in the past 18 months has tried multiple different therapies. Currently she seen a neurologist in [place] who is performing Botox injections which does help some. Additionally she has a oral surgery appointment on Monday for this issue as well. She wonders if there is any other explanation for the cause of her recalcitrant pain. She's concerned that she may have rheumatoid arthritis or some other explanation for her pain. She notes constant pain along her TMJ but does radiate to her lower jaw and maxilla.  She thinks that she may additionally have fibromyalgia it may be contributing to her pain.  She has been treated for trigeminal neuralgia in the past with Tegretol but this was not effective. Patient has seen a chiropractor as well had some relief sometimes with some manipulation and would like to consider that today.  PMH reviewed.  ROS as above otherwise neg Medications reviewed. Current Outpatient Prescriptions  Medication Sig Dispense Refill  . cyclobenzaprine (FLEXERIL) 10 MG tablet Take 1 tablet (10 mg total) by mouth every 8 (eight) hours as needed for muscle spasms.  90 tablet  3  . gabapentin (NEURONTIN) 300 MG capsule Take 1 capsule (300 mg total) by mouth 3 (three) times daily.  90 capsule  6  . cetirizine (ZYRTEC) 10 MG tablet Take 1 tablet (10 mg total) by mouth daily.  30 tablet  2  . DULoxetine (CYMBALTA) 30 MG capsule Take 1 capsule (30 mg total) by mouth daily. 1 pill daily x 2 weeks then 2 pills daily  45 capsule  2  . fluticasone (FLONASE) 50 MCG/ACT nasal spray by Nasal route. 2 squirts in each nostril daily, than after one week just one  squirt in each nostril       . meloxicam (MOBIC) 7.5 MG tablet Take 1 tablet (7.5 mg total) by mouth daily.  30 tablet  0  . montelukast (SINGULAIR) 10 MG tablet Take 1 tablet (10 mg total) by mouth daily.  30 tablet  2  . omeprazole (PRILOSEC OTC) 20 MG tablet Take 20 mg by mouth daily.          Exam:  BP 131/89  Pulse 86  Ht 5' 5.5" (1.664 m)  Wt 139 lb (63.05 kg)  BMI 22.78 kg/m2 Gen: Well NAD, anxious and uncomfortable appearing Face: Pain along the TMJ with mouth closed and open. Pain on mouth range of motion. No deviation of jaw with opening OMT Findings: Cervical: C3 rotated and side bent left C6 extended rotated and side bent right Thoracic: T4 extended rotated and side bent left Lumbar: L2 flexed rotated and side bent right Sacrum: Neutral

## 2011-01-13 NOTE — Assessment & Plan Note (Signed)
Pt had some left side swelling but very minimal. Patient actually has no deviation of the job on physical exam. Patient has seen multiple doctors for this problem and is continuing to see a neurologist and is going to see an orthodontist here in the near future. Patient is encouraged to get as much information as she can to allow her to make the right decision for himself. Patient did have some manipulation today mostly in the cervical region that did seem to help with most of the pain. Patient will return in 2-4 weeks for reevaluation if this was helpful.

## 2011-01-13 NOTE — Assessment & Plan Note (Signed)
After verbal consent pt did have HVLA, with marked improvement.  Gave side effects to look out for and can take anti inflammatories in the acute time frame.   

## 2011-01-13 NOTE — Patient Instructions (Signed)
Patient given verbal instructions 

## 2011-01-17 ENCOUNTER — Other Ambulatory Visit (HOSPITAL_COMMUNITY)
Admission: RE | Admit: 2011-01-17 | Discharge: 2011-01-17 | Disposition: A | Discharge status: Home / Self Care | Payer: Medicaid Other | Source: Ambulatory Visit | Attending: Family Medicine | Admitting: Family Medicine

## 2011-01-17 ENCOUNTER — Ambulatory Visit (INDEPENDENT_AMBULATORY_CARE_PROVIDER_SITE_OTHER): Payer: Medicaid Other | Admitting: Family Medicine

## 2011-01-17 ENCOUNTER — Encounter: Payer: Self-pay | Admitting: Family Medicine

## 2011-01-17 VITALS — BP 110/70 | Temp 97.7°F | Ht 65.5 in | Wt 139.0 lb

## 2011-01-17 DIAGNOSIS — N898 Other specified noninflammatory disorders of vagina: Secondary | ICD-10-CM

## 2011-01-17 DIAGNOSIS — N760 Acute vaginitis: Secondary | ICD-10-CM

## 2011-01-17 DIAGNOSIS — M26629 Arthralgia of temporomandibular joint, unspecified side: Secondary | ICD-10-CM

## 2011-01-17 DIAGNOSIS — Z01419 Encounter for gynecological examination (general) (routine) without abnormal findings: Secondary | ICD-10-CM | POA: Insufficient documentation

## 2011-01-17 DIAGNOSIS — Z124 Encounter for screening for malignant neoplasm of cervix: Secondary | ICD-10-CM

## 2011-01-17 DIAGNOSIS — G4440 Drug-induced headache, not elsewhere classified, not intractable: Secondary | ICD-10-CM

## 2011-01-17 DIAGNOSIS — Z0000 Encounter for general adult medical examination without abnormal findings: Secondary | ICD-10-CM

## 2011-01-17 LAB — POCT WET PREP (WET MOUNT)
Clue Cells Wet Prep HPF POC: NEGATIVE
Trichomonas Wet Prep HPF POC: NEGATIVE
Yeast Wet Prep HPF POC: NEGATIVE

## 2011-01-17 MED ORDER — CYCLOBENZAPRINE HCL 10 MG PO TABS: 10.0000 mg | ORAL_TABLET | Freq: Three times a day (TID) | ORAL | Status: DC | PRN

## 2011-01-17 NOTE — Patient Instructions (Signed)
Thank you for coming in today. I will refill your flexeril.  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  Read about it on [email or web address] and do some thinking. Do this fit you? See me in 2 weeks.

## 2011-01-17 NOTE — Assessment & Plan Note (Addendum)
Will see oral surgeon Wednesday.  Major problem for Ms. Arnell.  I think at this point referral to specialist care is the best idea. She has been not herself and we will follow this issue up in 2 weeks. Compounding this issue I think his medication overuse headache which is discussed separately.

## 2011-01-17 NOTE — Progress Notes (Signed)
Tasha Fernandez presents to clinic today to followup her headache and TMJ pain into have a wellness visit and a Pap smear.  She continues to experience daily headaches associated with her TMJ pain. She takes over-the-counter headache medicine daily. She has an appointment with a oral surgeon on Wednesday to discuss TMJ pain. She is desperate as she feels like nothing will help with her pain. Additionally she notes vaginal discharge associated with a fishy odor for the past several months. She denies any abdominal pain or high-risk sexual contacts recently.  PMH reviewed.  ROS as above otherwise neg Medications reviewed. Current Outpatient Prescriptions  Medication Sig Dispense Refill  . cetirizine (ZYRTEC) 10 MG tablet Take 1 tablet (10 mg total) by mouth daily.  30 tablet  2  . cyclobenzaprine (FLEXERIL) 10 MG tablet Take 1 tablet (10 mg total) by mouth every 8 (eight) hours as needed for muscle spasms.  90 tablet  3  . DULoxetine (CYMBALTA) 30 MG capsule Take 1 capsule (30 mg total) by mouth daily. 1 pill daily x 2 weeks then 2 pills daily  45 capsule  2  . fluticasone (FLONASE) 50 MCG/ACT nasal spray by Nasal route. 2 squirts in each nostril daily, than after one week just one squirt in each nostril       . gabapentin (NEURONTIN) 300 MG capsule Take 1 capsule (300 mg total) by mouth 3 (three) times daily.  90 capsule  6  . meloxicam (MOBIC) 7.5 MG tablet Take 1 tablet (7.5 mg total) by mouth daily.  30 tablet  0  . montelukast (SINGULAIR) 10 MG tablet Take 1 tablet (10 mg total) by mouth daily.  30 tablet  2  . omeprazole (PRILOSEC OTC) 20 MG tablet Take 20 mg by mouth daily.        Marland Kitchen DISCONTD: cyclobenzaprine (FLEXERIL) 10 MG tablet Take 1 tablet (10 mg total) by mouth every 8 (eight) hours as needed for muscle spasms.  90 tablet  3    Exam:  BP 110/70  Temp(Src) 97.7 F (36.5 C) (Oral)  Ht 5' 5.5" (1.664 m)  Wt 139 lb (63.05 kg)  BMI 22.78 kg/m2  LMP 01/06/2011 Gen: Well NAD HEENT: EOMI,   MMM, pain in both masseters and along the TMJ bilaterally. Lungs: CTABL Nl WOB Heart: RRR no MRG Abd: NABS, NT, ND Exts: Non edematous BL  LE, warm and well perfused.  GYN: Normal external genitalia with copious thin white discharge present. No cervical motion tenderness no adnexal masses present

## 2011-01-17 NOTE — Assessment & Plan Note (Signed)
Here for wellness visit. Past medical history, surgical history, social history, family history updated and reviewed. Pap smear obtained today. Will followup in clinic in 2 weeks to discuss her headaches.

## 2011-01-17 NOTE — Assessment & Plan Note (Signed)
In relation to TMJ pain. She takes pain medicines daily for her headache. I asked Tasha Fernandez to do some reading on medication overuse headache as her by in on this issue is important for the treatment. We'll followup in 2 weeks and initiate withdrawal of chronic daily NSAIDs and Tylenol.

## 2011-01-18 LAB — GC/CHLAMYDIA PROBE AMP, GENITAL: GC Probe Amp, Genital: NEGATIVE

## 2011-01-18 NOTE — Telephone Encounter (Signed)
resolved 

## 2011-01-24 ENCOUNTER — Other Ambulatory Visit: Payer: Self-pay | Admitting: Oral Surgery

## 2011-01-24 DIAGNOSIS — M2669 Other specified disorders of temporomandibular joint: Secondary | ICD-10-CM

## 2011-01-25 ENCOUNTER — Ambulatory Visit (INDEPENDENT_AMBULATORY_CARE_PROVIDER_SITE_OTHER): Payer: Medicaid Other | Admitting: Family Medicine

## 2011-01-25 DIAGNOSIS — M26629 Arthralgia of temporomandibular joint, unspecified side: Secondary | ICD-10-CM

## 2011-01-25 DIAGNOSIS — M9981 Other biomechanical lesions of cervical region: Secondary | ICD-10-CM

## 2011-01-25 DIAGNOSIS — M999 Biomechanical lesion, unspecified: Secondary | ICD-10-CM

## 2011-01-25 NOTE — Patient Instructions (Signed)
Patient given verbal instructions 

## 2011-01-25 NOTE — Assessment & Plan Note (Signed)
After verbal consent pt did have HVLA, with marked improvement.  Gave side effects to look out for and can take anti inflammatories in the acute time frame.   

## 2011-01-25 NOTE — Assessment & Plan Note (Signed)
Patient seen an oral surgeon and maybe having some intervention done in the near future. Patient has an MRI due tomorrow patient to continue the same medications. Patient given some stretches to do at night to see if these improve.

## 2011-01-25 NOTE — Progress Notes (Signed)
  Subjective:    Patient ID: Tasha Fernandez, female    DOB: Mar 22, 1970, 40 y.o.   MRN: [id number]  HPI Tasha Fernandez presents to clinic today to followup her bilateral TMJ pain. Today only her left side is hurting her. Patient states that the manipulation did seem to improve her neck pain. The pain in her TMJ did not seem to have too much. Patient recently had seen an oral surgeon who is getting an MRI done tomorrow to evaluate the TMJ further. Patient denies much of headaches now he denies any numbness in extremities or any visual changes. Patient is attempting to try to decrease the medications she takes the day for pain which maybe that would be contributing to it per Dr. Zollie Pee notes. Patient would like evaluation for OMT again.  PMH reviewed.  ROS as above otherwise neg Medications reviewed. Current Outpatient Prescriptions  Medication Sig Dispense Refill  . cyclobenzaprine (FLEXERIL) 10 MG tablet Take 1 tablet (10 mg total) by mouth every 8 (eight) hours as needed for muscle spasms.  90 tablet  3  . gabapentin (NEURONTIN) 300 MG capsule Take 1 capsule (300 mg total) by mouth 3 (three) times daily.  90 capsule  6  . cetirizine (ZYRTEC) 10 MG tablet Take 1 tablet (10 mg total) by mouth daily.  30 tablet  2  . DULoxetine (CYMBALTA) 30 MG capsule Take 1 capsule (30 mg total) by mouth daily. 1 pill daily x 2 weeks then 2 pills daily  45 capsule  2  . fluticasone (FLONASE) 50 MCG/ACT nasal spray by Nasal route. 2 squirts in each nostril daily, than after one week just one squirt in each nostril       . meloxicam (MOBIC) 7.5 MG tablet Take 1 tablet (7.5 mg total) by mouth daily.  30 tablet  0  . montelukast (SINGULAIR) 10 MG tablet Take 1 tablet (10 mg total) by mouth daily.  30 tablet  2  . omeprazole (PRILOSEC OTC) 20 MG tablet Take 20 mg by mouth daily.          Review of Systems     Objective:   Physical Exam Exam:  BP 131/89  Pulse 86  Ht 5' 5.5" (1.664 m)  Wt 139 lb (63.05 kg)   BMI 22.78 kg/m2 Gen: Well NAD, anxious and uncomfortable appearing Face: Pain along the TMJ with mouth closed and open mostly left-sided. Pain on mouth range of motion. No deviation of jaw with opening OMT Findings: Cervical: C3 rotated and side bent left Thoracic: T4 extended rotated and side bent left Lumbar: L2 flexed rotated and side bent left Sacrum: Neutral    Assessment & Plan:

## 2011-01-26 ENCOUNTER — Ambulatory Visit
Admission: RE | Admit: 2011-01-26 | Discharge: 2011-01-26 | Disposition: A | Discharge status: Home / Self Care | Payer: Medicaid Other | Source: Ambulatory Visit | Attending: Oral Surgery | Admitting: Oral Surgery

## 2011-01-26 DIAGNOSIS — M2669 Other specified disorders of temporomandibular joint: Secondary | ICD-10-CM

## 2011-01-27 ENCOUNTER — Telehealth: Payer: Self-pay | Admitting: Family Medicine

## 2011-01-27 NOTE — Telephone Encounter (Signed)
Attempted to call patient unidentified voicemail. Please tell patient pap was normal.Tasha Fernandez, Rodena Medin

## 2011-01-27 NOTE — Telephone Encounter (Signed)
Pt is asking for results of her pap smear.

## 2011-02-02 ENCOUNTER — Ambulatory Visit (INDEPENDENT_AMBULATORY_CARE_PROVIDER_SITE_OTHER): Payer: Medicaid Other | Admitting: Family Medicine

## 2011-02-02 ENCOUNTER — Encounter: Payer: Self-pay | Admitting: Family Medicine

## 2011-02-02 VITALS — BP 133/84 | HR 111 | Temp 98.2°F | Ht 65.5 in | Wt 138.0 lb

## 2011-02-02 DIAGNOSIS — M26629 Arthralgia of temporomandibular joint, unspecified side: Secondary | ICD-10-CM

## 2011-02-02 DIAGNOSIS — R300 Dysuria: Secondary | ICD-10-CM

## 2011-02-02 DIAGNOSIS — N390 Urinary tract infection, site not specified: Secondary | ICD-10-CM | POA: Insufficient documentation

## 2011-02-02 LAB — POCT UA - MICROSCOPIC ONLY

## 2011-02-02 LAB — POCT URINALYSIS DIPSTICK
Ketones, UA: NEGATIVE
Protein, UA: NEGATIVE
Urobilinogen, UA: 0.2
pH, UA: 6.5

## 2011-02-02 MED ORDER — DULOXETINE HCL 60 MG PO CPEP: 30.0000 mg | ORAL_CAPSULE | Freq: Every day | ORAL | Status: DC

## 2011-02-02 MED ORDER — CEPHALEXIN 500 MG PO CAPS: 500.0000 mg | ORAL_CAPSULE | Freq: Two times a day (BID) | ORAL | Status: AC

## 2011-02-02 NOTE — Assessment & Plan Note (Signed)
Patient has a urinary tract infection based on microscopy and symptoms. Plan to treat empirically with Keflex 500 mg twice a day.  We'll followup in one month or sooner if not resolved.

## 2011-02-02 NOTE — Progress Notes (Signed)
Addended by: [place], Tilda Samudio on: 02/02/2011 04:23 PM   Modules accepted: Orders

## 2011-02-02 NOTE — Patient Instructions (Signed)
Thank you for coming in today. You have a UTI. Take kelfex twice a day for 1 week.  I am doing a test to rule out a rare autoimmune disorder. It should come back soon. I will call you.  Come back in 1-2 months.  Take the new dose of cymbalta.

## 2011-02-02 NOTE — Progress Notes (Signed)
Tasha Fernandez is a 40 year old woman being followed by me for bilateral jaw pain that she associates with TMJ.  She comes in today however to discuss urinary frequency and urgency associated with a foul smelling urine. This has been present for several weeks in the foul smell for longer than that. She denies any abdominal pain fevers or chills. She feels well overall.   For her TMJ pain she continues to take duloxetine 30 mg in addition to gabapentin. She has been seen by her oral surgeon who has performed an MRI. The MRI shows no evidence of arthritis of the temporomandibular joint.  She has a followup appointment with her oral surgeon next week.    PMH reviewed.  ROS as above otherwise neg Medications reviewed. Current Outpatient Prescriptions  Medication Sig Dispense Refill  . cephALEXin (KEFLEX) 500 MG capsule Take 1 capsule (500 mg total) by mouth 2 (two) times daily.  14 capsule  0  . cetirizine (ZYRTEC) 10 MG tablet Take 1 tablet (10 mg total) by mouth daily.  30 tablet  2  . cyclobenzaprine (FLEXERIL) 10 MG tablet Take 1 tablet (10 mg total) by mouth every 8 (eight) hours as needed for muscle spasms.  90 tablet  3  . DULoxetine (CYMBALTA) 60 MG capsule Take 1 capsule (60 mg total) by mouth daily. 1 pill daily x 2 weeks then 2 pills daily  30 capsule  3  . fluticasone (FLONASE) 50 MCG/ACT nasal spray by Nasal route. 2 squirts in each nostril daily, than after one week just one squirt in each nostril       . gabapentin (NEURONTIN) 300 MG capsule Take 1 capsule (300 mg total) by mouth 3 (three) times daily.  90 capsule  6  . meloxicam (MOBIC) 7.5 MG tablet Take 1 tablet (7.5 mg total) by mouth daily.  30 tablet  0  . montelukast (SINGULAIR) 10 MG tablet Take 1 tablet (10 mg total) by mouth daily.  30 tablet  2  . omeprazole (PRILOSEC OTC) 20 MG tablet Take 20 mg by mouth daily.        Marland Kitchen DISCONTD: DULoxetine (CYMBALTA) 30 MG capsule Take 1 capsule (30 mg total) by mouth daily. 1 pill daily x 2  weeks then 2 pills daily  45 capsule  2    Exam:  BP 133/84  Pulse 111  Temp(Src) 98.2 F (36.8 C) (Oral)  Ht 5' 5.5" (1.664 m)  Wt 138 lb (62.596 kg)  BMI 22.61 kg/m2  LMP 01/10/2011 Gen: Well NAD HEENT: EOMI,  MMM, pain about both masseters and TMJ bilaterally. Lungs: CTABL Nl WOB Heart: RRR no MRG Abd: NABS, NT, ND Exts: Non edematous BL  LE, warm and well perfused.

## 2011-02-02 NOTE — Assessment & Plan Note (Signed)
Based on the MRI results I doubt that she actually has temporomandibular joint arthritis.  She has had extensive workup in the past including an MRI.  I think at this point the most cautious approach would be to obtain any erythrocyte sedimentation rate to rule out temporal arteritis. We'll followup in one month. Also plan to increase Cymbalta to 60 mg

## 2011-02-09 ENCOUNTER — Telehealth: Payer: Self-pay | Admitting: Family Medicine

## 2011-02-09 NOTE — Telephone Encounter (Signed)
Tasha Fernandez would like a referral to [facility] [phone], she was instructed by her oral surgeon to get this.

## 2011-02-09 NOTE — Telephone Encounter (Signed)
Called pt's house and left message with female to call us back.  (Pt needs OV in order to have referral) .Arlyss Repress

## 2011-02-15 ENCOUNTER — Encounter: Payer: Self-pay | Admitting: Family Medicine

## 2011-02-15 ENCOUNTER — Ambulatory Visit (INDEPENDENT_AMBULATORY_CARE_PROVIDER_SITE_OTHER): Payer: Medicaid Other | Admitting: Family Medicine

## 2011-02-15 VITALS — BP 125/86 | HR 92 | Ht 65.0 in | Wt 138.9 lb

## 2011-02-15 DIAGNOSIS — M26629 Arthralgia of temporomandibular joint, unspecified side: Secondary | ICD-10-CM

## 2011-02-15 MED ORDER — GABAPENTIN 400 MG PO CAPS: 400.0000 mg | ORAL_CAPSULE | Freq: Three times a day (TID) | ORAL | Status: DC

## 2011-02-15 MED ORDER — CYCLOBENZAPRINE HCL 10 MG PO TABS: 10.0000 mg | ORAL_TABLET | Freq: Three times a day (TID) | ORAL | Status: DC | PRN

## 2011-02-15 NOTE — Patient Instructions (Addendum)
Thank you for coming in today. I do not know for sure why you have pain.  I suspect that you have pain coming from your nerves.  Increase your Gabapentin to 400 three times a day.  Continue your cymbalta and flexeril at night.  See your specialist in jaw pain. I think that doctor may help.  I will refer you to the ENT doctor. We will call with the appointment.  See me in 2 months or sooner.

## 2011-02-16 NOTE — Assessment & Plan Note (Signed)
I suspect her jaw pain is neuropathic in nature.  This could be a candidate to sympathetic regional dystrophy, or possibly trigeminal neuralgia.  She does get some reduction in her pain with Cymbalta and with gabapentin.  I plan to increase her gabapentin to 400  3 times a day.  Additionally we'll refill Flexeril at night.  Follow up with me in one to 2 months.  I recommend that she followup with her neurologist who specializes in this area as I am reaching the limits of my knowledge.  She expresses understanding and will followup

## 2011-02-16 NOTE — Progress Notes (Signed)
Tasha Fernandez is a 41 year old woman who presents to clinic to discuss her bilateral jaw pain.  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  After reviewing MRI he doesn't think there is any surgical option as she has a essentially normal MRI.  She thinks that the Cymbalta and gabapentin are helping.  She thinks the Flexeril at night is very effective for preventing grinding of her teeth.  She wonders if there's any other options to intervene on his pain.  She is currently seeing a neurologist for Botox injections every 3 months.   Aside from her pain she does feel well.  She thinks that her pain has improved in her functionality has improved in the last several months however it is still very distressing for her.  PMH reviewed.  ROS as above otherwise neg Medications reviewed. Current Outpatient Prescriptions  Medication Sig Dispense Refill  . cetirizine (ZYRTEC) 10 MG tablet Take 1 tablet (10 mg total) by mouth daily.  30 tablet  2  . cyclobenzaprine (FLEXERIL) 10 MG tablet Take 1 tablet (10 mg total) by mouth every 8 (eight) hours as needed for muscle spasms.  90 tablet  3  . DULoxetine (CYMBALTA) 60 MG capsule Take 1 capsule (60 mg total) by mouth daily. 1 pill daily x 2 weeks then 2 pills daily  30 capsule  3  . fluticasone (FLONASE) 50 MCG/ACT nasal spray by Nasal route. 2 squirts in each nostril daily, than after one week just one squirt in each nostril       . gabapentin (NEURONTIN) 400 MG capsule Take 1 capsule (400 mg total) by mouth 3 (three) times daily.  90 capsule  6  . meloxicam (MOBIC) 7.5 MG tablet Take 1 tablet (7.5 mg total) by mouth daily.  30 tablet  0  . montelukast (SINGULAIR) 10 MG tablet Take 1 tablet (10 mg total) by mouth daily.  30 tablet  2  . omeprazole (PRILOSEC OTC) 20 MG tablet Take 20 mg by mouth daily.          Exam:  BP 125/86  Pulse 92  Ht 5\' 5"  (1.651 m)  Wt 138 lb 14.4 oz (63.005 kg)  BMI 23.11 kg/m2  LMP 02/07/2011 Gen: Well NAD,  anxious-appearing HEENT: EOMI,  MMM, tender along masseters and TMJs Lungs: CTABL Nl WOB Heart: RRR no MRG

## 2011-02-17 ENCOUNTER — Telehealth: Payer: Self-pay | Admitting: *Deleted

## 2011-02-17 NOTE — Telephone Encounter (Signed)
Called and informed patient of appointment at [facility] 02/18/11 at 1 pm.Busick, Rodena Medin

## 2011-02-18 ENCOUNTER — Ambulatory Visit: Payer: Medicaid Other | Admitting: Family Medicine

## 2011-02-25 ENCOUNTER — Telehealth: Payer: Self-pay | Admitting: Family Medicine

## 2011-02-25 DIAGNOSIS — R51 Headache: Secondary | ICD-10-CM

## 2011-02-25 NOTE — Telephone Encounter (Signed)
Need referral for [facility] office.  Would like to see them about the headaches she's having.  Let her know when completion.

## 2011-02-28 ENCOUNTER — Ambulatory Visit: Payer: Medicaid Other | Admitting: Family Medicine

## 2011-03-11 NOTE — Telephone Encounter (Signed)
Order in.

## 2011-03-14 ENCOUNTER — Telehealth: Payer: Self-pay | Admitting: *Deleted

## 2011-03-14 NOTE — Telephone Encounter (Signed)
Called and told pt that her referral information has been faxed to [facility] [phone]. Informed her that either their office will call her with the appt info or they will fax it back to [place] and we will contact her concerning this. Pt understood and agreed.Tasha Fernandez [place]

## 2011-04-12 ENCOUNTER — Telehealth: Payer: Self-pay | Admitting: Family Medicine

## 2011-04-12 DIAGNOSIS — M26629 Arthralgia of temporomandibular joint, unspecified side: Secondary | ICD-10-CM

## 2011-04-12 NOTE — Telephone Encounter (Signed)
Will fwd. To PCP. .Jamarl Pew  

## 2011-04-12 NOTE — Telephone Encounter (Signed)
Patient needs a refill on Cymbalta to go to [facility] on [address].

## 2011-04-14 MED ORDER — DULOXETINE HCL 60 MG PO CPEP: 60.0000 mg | ORAL_CAPSULE | Freq: Two times a day (BID) | ORAL | Status: DC

## 2011-04-14 NOTE — Telephone Encounter (Signed)
Attempted to call patient, home number is busy and cell phone is no longer in service. If patient returns call please tell her Rx was sent to pharmacy.Jerico Grisso, Rodena Medin

## 2011-04-14 NOTE — Telephone Encounter (Signed)
Completed. Route to red team to call pt.

## 2011-04-18 ENCOUNTER — Ambulatory Visit: Payer: Medicaid Other | Admitting: Family Medicine

## 2011-04-19 ENCOUNTER — Encounter: Payer: Self-pay | Admitting: Family Medicine

## 2011-04-19 ENCOUNTER — Ambulatory Visit (INDEPENDENT_AMBULATORY_CARE_PROVIDER_SITE_OTHER): Payer: Medicaid Other | Admitting: Family Medicine

## 2011-04-19 VITALS — BP 102/68 | HR 60 | Ht 65.0 in | Wt 141.0 lb

## 2011-04-19 DIAGNOSIS — R51 Headache: Secondary | ICD-10-CM

## 2011-04-19 DIAGNOSIS — IMO0002 Reserved for concepts with insufficient information to code with codable children: Secondary | ICD-10-CM

## 2011-04-19 DIAGNOSIS — M999 Biomechanical lesion, unspecified: Secondary | ICD-10-CM

## 2011-04-19 DIAGNOSIS — M792 Neuralgia and neuritis, unspecified: Secondary | ICD-10-CM

## 2011-04-19 DIAGNOSIS — M9981 Other biomechanical lesions of cervical region: Secondary | ICD-10-CM

## 2011-04-19 NOTE — Assessment & Plan Note (Signed)
After verbal consent pt did have HVLA, with marked improvement.  Gave side effects to look out for and can take anti inflammatories in the acute time frame.  Given exercises and stretches to do on a daily and nightly basis. Patient will followup in 3-6 weeks if manipulation is helpful.

## 2011-04-19 NOTE — Assessment & Plan Note (Signed)
Continue current medications, told her I would like to consider stopping the Flexeril which could be contributing to the headaches per patient declined at this time. Continue to monitor

## 2011-04-19 NOTE — Assessment & Plan Note (Signed)
Patient is here still for this chronic headache pain. Patient has been treated for neuropathic pain from muscle skeletal pain does not seem to be improving considerably. Will refer to headache clinic today and per potential evaluation and management. Do not feel imaging is necessary no red flags at this time. Concern patient did bring in disability paperwork for this chronic pain type symptoms, told her that I would not fill this out and she would need to discuss this with her primary care provider but in my opinion she is not disabled at this time. Differential should also include sleep apnea and maybe a sleep study would be beneficial if patient continues to have chronic headaches. I do feel that this headache is multifactorial with likely a potential of muscle skeletal complaints, tension headaches, and TMJ being the root of this. Patient can followup with me in 3-6 weeks for more manipulation if helpful.

## 2011-04-19 NOTE — Patient Instructions (Signed)
It is good to see you. I am going to send you to the headache clinic, I think they're going to be the best bet to make sense of your chronic headaches. You can discuss the disability paperwork with either Dr. Denyse Amass or find her own disability position. If the manipulation helps you can come back in 3-6 weeks. Continue doing exercises and stretches for your TMJ as well as her neck and upper back.

## 2011-04-19 NOTE — Progress Notes (Signed)
  Subjective:    Patient ID: Tasha Fernandez, female    DOB: 04/25/1970, 41 y.o.   MRN: [id number]  HPI 41 year old female coming in with chronic headaches and neuropathic pain. Patient has been seeing me for the potential OMT treatment to help patient's pain. 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 Patient states that the pain usually starts in the bitemporal lobes potentially as well as right behind the ears or any jaw. This pain that radiates down the neck to the shoulders.  Patient is also brought in disability paperwork to be filled out today   Review of Systems Denies fevers chills vision changes or weakness in the extremities.    Objective:   Physical Exam Filed Vitals:   04/19/11 1140  BP: 102/68  Pulse: 60    Gen: Well NAD, anxious and uncomfortable appearing Face: Pain along the TMJ with mouth closed and open mostly left-sided. Pain on mouth range of motion. No deviation of jaw with opening OMT Findings: Cervical: C2 flexed rotated and side bent left C5 flexed rotated and side bent right Thoracic: T4 extended rotated and side bent left Lumbar: L2 flexed rotated and side bent left Sacrum: Left on left    Assessment & Plan:

## 2011-04-21 ENCOUNTER — Ambulatory Visit (INDEPENDENT_AMBULATORY_CARE_PROVIDER_SITE_OTHER): Payer: Medicaid Other | Admitting: Family Medicine

## 2011-04-21 ENCOUNTER — Encounter: Payer: Self-pay | Admitting: Family Medicine

## 2011-04-21 ENCOUNTER — Encounter (HOSPITAL_COMMUNITY): Payer: Self-pay | Admitting: Nurse Practitioner

## 2011-04-21 ENCOUNTER — Emergency Department (HOSPITAL_COMMUNITY)
Admission: EM | Admit: 2011-04-21 | Discharge: 2011-04-21 | Disposition: A | Discharge status: Home / Self Care | Payer: Medicaid Other | Attending: Emergency Medicine | Admitting: Emergency Medicine

## 2011-04-21 VITALS — BP 120/83 | HR 99 | Ht 65.0 in | Wt 144.0 lb

## 2011-04-21 DIAGNOSIS — G8929 Other chronic pain: Secondary | ICD-10-CM

## 2011-04-21 DIAGNOSIS — R51 Headache: Secondary | ICD-10-CM | POA: Insufficient documentation

## 2011-04-21 DIAGNOSIS — F411 Generalized anxiety disorder: Secondary | ICD-10-CM

## 2011-04-21 DIAGNOSIS — Z79899 Other long term (current) drug therapy: Secondary | ICD-10-CM | POA: Insufficient documentation

## 2011-04-21 DIAGNOSIS — R6884 Jaw pain: Secondary | ICD-10-CM

## 2011-04-21 DIAGNOSIS — G4440 Drug-induced headache, not elsewhere classified, not intractable: Secondary | ICD-10-CM

## 2011-04-21 DIAGNOSIS — M26629 Arthralgia of temporomandibular joint, unspecified side: Secondary | ICD-10-CM | POA: Insufficient documentation

## 2011-04-21 DIAGNOSIS — S0300XA Dislocation of jaw, unspecified side, initial encounter: Secondary | ICD-10-CM

## 2011-04-21 DIAGNOSIS — T50904A Poisoning by unspecified drugs, medicaments and biological substances, undetermined, initial encounter: Secondary | ICD-10-CM

## 2011-04-21 MED ORDER — BUSPIRONE HCL 10 MG PO TABS: 10.0000 mg | ORAL_TABLET | Freq: Three times a day (TID) | ORAL | Status: DC

## 2011-04-21 NOTE — ED Notes (Signed)
PT STATES SHE HAS BEEN IN PAIN FOR 22 MONTHS. SHE HAS SEEN ORAL SURGEONS, CHIROPRACTOR. NEURO AT [facility] THAT GIVES HER BOTOX INJECTONS Q 3 MONTHS, IS ON "NERVE BLOCKERS", GOODY POWDERS, AND "MUSCLE RELAXERS". STATES SHE LIVES IN PAIN. PT OBSERVED MOVING ABOUT FREELY WITHOUT FACIAL GRIMACE OR GUARDED MOVEMENTS. SHE IS ABLE TO TALK RAPIDLY ABOUT HER SYMPTOMS WITHOUT GUARDING MOVEMENT OF HER MOUTH. SHE COMPLAINS OF PAIN IN HER JAWS, EARS, NECK. STATES SHE JUST WANTS RELIEF.

## 2011-04-21 NOTE — ED Notes (Signed)
C/o chronic headaches x 22 months. States pain is worse today. Taking neurontin and muscle relaxers with no relief. A&Ox4, resp e/u

## 2011-04-21 NOTE — ED Provider Notes (Signed)
Medical screening examination/treatment/procedure(s) were performed by non-physician practitioner and as supervising physician I was immediately available for consultation/collaboration.  Tyrhonda Georgiades, MD 04/21/11 2205 

## 2011-04-21 NOTE — Patient Instructions (Signed)
Thank you for coming in today. Please start taking the buspar three times a day.  Please consider pain management.  Call the [facility] or [facility] and see if they take your insurance.  See me in 1 month.  I think your headaches are made worse by constant ibuprofen and tylenol. This is call medication overuse headache.  I expect that the headache doctors will say the same thing.  Stop taking tylenol or ibuprofen for 1 week.  Only use those medicines for bad headaches.  I think a headache specialist is warranted.  I am out of ideas and you are already getting into the specialists that I would refer you to.

## 2011-04-21 NOTE — ED Provider Notes (Signed)
History     CSN: [id number]  Arrival date & time 04/21/11  1226   First MD Initiated Contact with Patient 04/21/11 1254      Chief Complaint  Patient presents with  . Headache    (Consider location/radiation/quality/duration/timing/severity/associated sxs/prior treatment) Patient is a 41 y.o. female presenting with headaches. The history is provided by the patient.  Headache  This is a chronic problem. The problem occurs constantly. The problem has been gradually worsening. Pertinent negatives include no fever.  Pt with chronic jaw/TMJ  pain for the last 2 years. States "I have seen over 40 doctors and specialists, and no one can help me." States she has pain every day. Pain worsened with chewing, eating, opening her mouth. States she gets botox injections by her neurologist in [place], taking talenol and ibuprofen with no relief. Pt was seen by  Her PCP this morning, was told she needed to go to pain management.   Past Medical History  Diagnosis Date  . Seasonal allergies   . Anxiety   . TMJ (dislocation of temporomandibular joint)     History reviewed. No pertinent past surgical history.  History reviewed. No pertinent family history.  History  Substance Use Topics  . Smoking status: Never Smoker   . Smokeless tobacco: Never Used  . Alcohol Use: Yes     social    OB History    Grav Para Term Preterm Abortions TAB SAB Ect Mult Living                  Review of Systems  Constitutional: Negative for fever and chills.  HENT: Positive for ear pain and neck pain. Negative for hearing loss, congestion, sore throat, dental problem and tinnitus.   Eyes: Negative.   Respiratory: Negative.   Cardiovascular: Negative.   Gastrointestinal: Negative.   Genitourinary: Negative.   Skin: Negative.   Neurological: Positive for headaches. Negative for dizziness, weakness, light-headedness and numbness.  Psychiatric/Behavioral: Negative.     Allergies  Review of patient's  allergies indicates no known allergies.  Home Medications   Current Outpatient Rx  Name Route Sig Dispense Refill  . ACETAMINOPHEN 500 MG PO TABS Oral Take 1,000 mg by mouth every 6 (six) hours as needed. For headache    . GOODY HEADACHE PO Oral Take 1 packet by mouth every 6 (six) hours as needed. For headache    . CYCLOBENZAPRINE HCL 10 MG PO TABS Oral Take 1 tablet (10 mg total) by mouth every 8 (eight) hours as needed for muscle spasms. 90 tablet 3  . DULOXETINE HCL 60 MG PO CPEP Oral Take 1 capsule (60 mg total) by mouth 2 (two) times daily. 30 capsule 3  . GABAPENTIN 400 MG PO CAPS Oral Take 1 capsule (400 mg total) by mouth 3 (three) times daily. 90 capsule 6    BP 112/85  Pulse 99  Temp(Src) 97.7 F (36.5 C) (Oral)  Resp 18  Ht 5\' 5"  (1.651 m)  Wt 141 lb (63.957 kg)  BMI 23.46 kg/m2  SpO2 99%  Physical Exam  Nursing note and vitals reviewed. Constitutional: She is oriented to person, place, and time. She appears well-developed and well-nourished.  HENT:  Head: Normocephalic and atraumatic.       Tenderness over bilateral TMJ joint. Trismus present. Pain with opening mouth. Normal dentition. Normal ear/TMs.  Eyes: Conjunctivae and EOM are normal. Pupils are equal, round, and reactive to light.  Neck: Normal range of motion. Neck supple.  Cardiovascular: Normal  rate, regular rhythm and normal heart sounds.   Pulmonary/Chest: Effort normal and breath sounds normal. No respiratory distress.  Lymphadenopathy:    She has no cervical adenopathy.  Neurological: She is alert and oriented to person, place, and time. No cranial nerve deficit. Coordination normal.       Equal grip strength bilaterally. Normal finger to nose. No nystagmus. Gait normal.  Skin: Skin is warm and dry.  Psychiatric: She has a normal mood and affect.       Pt tearful, anxious    ED Course  Procedures (including critical care time)  Pt with headache for 2 years now. Has been seen by various  specialists, diagnosed with TMJ. States no treatments are helping. Watns referral to ENT. Will refer to ent doctor here in [place]. Pt neurologicaly intact, normal neuro exam. Will d/c home.    No diagnosis found.    MDM          Lottie Mussel, PA 04/21/11 [phone]

## 2011-04-21 NOTE — Discharge Instructions (Signed)
Continue to take your regular medications. Follow up with ear nose throat doctor as referred. Follow up with your regular doctor as well and pain management as suggested by them.   Temporomandibular Problems  Temporomandibular joint (TMJ) dysfunction means there are problems with the joint between your jaw and your skull. This is a joint lined by cartilage like other joints in your body but also has a small disc in the joint which keeps the bones from rubbing on each other. These joints are like other joints and can get inflamed (sore) from arthritis and other problems. When this joint gets sore, it can cause headaches and pain in the jaw and the face. CAUSES  Usually the arthritic types of problems are caused by soreness in the joint. Soreness in the joint can also be caused by overuse. This may come from grinding your teeth. It may also come from mis-alignment in the joint. DIAGNOSIS Diagnosis of this condition can often be made by history and exam. Sometimes your caregiver may need X-rays or an MRI scan to determine the exact cause. It may be necessary to see your dentist to determine if your teeth and jaws are lined up correctly. TREATMENT  Most of the time this problem is not serious; however, sometimes it can persist (become chronic). When this happens medications that will cut down on inflammation (soreness) help. Sometimes a shot of cortisone into the joint will be helpful. If your teeth are not aligned it may help for your dentist to make a splint for your mouth that can help this problem. If no physical problems can be found, the problem may come from tension. If tension is found to be the cause, biofeedback or relaxation techniques may be helpful. HOME CARE INSTRUCTIONS   Later in the day, applications of ice packs may be helpful. Ice can be used in a plastic bag with a towel around it to prevent frostbite to skin. This may be used about every 2 hours for 20 to 30 minutes, as needed while  awake, or as directed by your caregiver.   Only take over-the-counter or prescription medicines for pain, discomfort, or fever as directed by your caregiver.   If physical therapy was prescribed, follow your caregiver's directions.   Wear mouth appliances as directed if they were given.  Document Released: 10/19/2000 Document Revised: 01/13/2011 Document Reviewed: 01/27/2008 [facility] Patient Information 2012 [place], [place].

## 2011-04-22 ENCOUNTER — Encounter: Payer: Self-pay | Admitting: Family Medicine

## 2011-04-22 DIAGNOSIS — G8929 Other chronic pain: Secondary | ICD-10-CM | POA: Insufficient documentation

## 2011-04-22 MED ORDER — BUSPIRONE HCL 10 MG PO TABS: 10.0000 mg | ORAL_TABLET | Freq: Three times a day (TID) | ORAL | Status: DC

## 2011-04-22 NOTE — Assessment & Plan Note (Signed)
Chronic jaw pain with uncertain diagnosis. I suspect that TMJ is the most likely diagnosis. It would be very likely for this to be trigeminal neuralgia bilaterally.  However I am unable to offer her an effective intervention. I have discussed this with her at length in the past. I have no more ideas of what to do to help her pain. She has seen a specialist for this issue and I recommended strongly that she continue to followup with the experts in TMJ in the area. Additionally I am unable to offer her a steroid injection in her jaw nor do I know anybody can.  I recommended contacting the neurologist she is doing the Botox injections for further advice on this matter as I feel that that physician is more knowledgeable this area then I am.  I recommended following up with me in one month

## 2011-04-22 NOTE — Assessment & Plan Note (Signed)
I strongly suspect that this patient's headache is medication overuse. I have discussed this with her in the past and she has denied that medication overuse headache could be a possibility.  Her neuro exam is normal. She is an appointment with a headache specialist on May 9th.  I recommended abstaining from Tylenol or NSAIDs for one week and only using pain medicines for extreme headache. Additionally I strongly recommend that she followup with the headache clinic for this matter.  I discussed warning signs and she expresses understanding. Plan followup in one month

## 2011-04-22 NOTE — Assessment & Plan Note (Signed)
Patient certainly has an anxiety component with a GAD 7 at 15. Patient is on duloxetine at a reasonable dose. Plan to augment with buspirone 10 mg 3 times a day and followup in one month. Discussed warning signs.

## 2011-04-22 NOTE — Progress Notes (Signed)
Tasha Fernandez is a 41 y.o. female who presents to [facility] today for   1) bilateral jaw pain. Patient has been presumptively diagnosed with TMJ syndrome. She is seeing multiple specialists for this issue including a neurologist in [place] who administers Botox injections. Additionally she has seen an oral surgeon who specializes in TMJ who has done a MRI which did not show significant joint breakdown. He declined to offer surgery. She continues to experience bilateral pain that is very bothersome. This pain is refractory to every intervention I have attempted including duloxetine, Neurontin, NSAIDs, Flexeril, bite guard.  She is very frustrated that she is not getting better and wonders if I or anyone else can do a injection into the joints.   2) anxiety. Patient denies any baseline anxiety however when asked she says that she worries about her pain constantly and has had an anxiety disorder in the past. PHQ9 is 13 and GAD 7 is 15.  She is currently on duloxetine for pain.   3) bilateral headache present for months. She attributes her headache to the TMJ pain. She takes NSAIDs and Tylenol around-the-clock for headache symptoms. She has to take medicines to prevent headache. She notes that her headache is bitemporal and occasionally pounding. She denies any visual disturbance weakness numbness or loss of coordination.  PMH reviewed. Significant for TMJ syndrome and ROS as above otherwise neg Medications reviewed. Current Outpatient Prescriptions  Medication Sig Dispense Refill  . acetaminophen (TYLENOL) 500 MG tablet Take 1,000 mg by mouth every 6 (six) hours as needed. For headache      . Aspirin-Acetaminophen-Caffeine (GOODY HEADACHE PO) Take 1 packet by mouth every 6 (six) hours as needed. For headache      . cyclobenzaprine (FLEXERIL) 10 MG tablet Take 1 tablet (10 mg total) by mouth every 8 (eight) hours as needed for muscle spasms.  90 tablet  3  . DULoxetine (CYMBALTA) 60 MG capsule Take 1  capsule (60 mg total) by mouth 2 (two) times daily.  30 capsule  3  . gabapentin (NEURONTIN) 400 MG capsule Take 1 capsule (400 mg total) by mouth 3 (three) times daily.  90 capsule  6  . busPIRone (BUSPAR) 10 MG tablet Take 1 tablet (10 mg total) by mouth 3 (three) times daily.  90 tablet  2    Exam:  BP 120/83  Pulse 99  Ht 5\' 5"  (1.651 m)  Wt 144 lb (65.318 kg)  BMI 23.96 kg/m2 Gen: Well NAD HEENT: EOMI,  MMM, pupils equal round reactive to light. Mouth range of motion is diminished. Popping palpable over her TMJs with mouth opening Lungs: CTABL Nl WOB Heart: RRR no MRG Abd: NABS, NT, ND Exts: Non edematous BL  LE, warm and well perfused.  Psych: Alert and oriented x3 affect is EGD and anxious. Thought process is mildly tangential. Speech is rapid, but coherent.  No delusions or hallucinations expressed. No SI/HI. Neuro: Cranial nerves II through XII are intact. Reflexes sensation strength coordination intact. Gait is normal. Romberg negative. Rapid alternating movements normal.

## 2011-05-02 ENCOUNTER — Other Ambulatory Visit: Payer: Self-pay | Admitting: *Deleted

## 2011-05-02 DIAGNOSIS — M542 Cervicalgia: Secondary | ICD-10-CM

## 2011-05-04 ENCOUNTER — Other Ambulatory Visit: Payer: Medicaid Other

## 2011-05-04 ENCOUNTER — Telehealth: Payer: Self-pay | Admitting: Family Medicine

## 2011-05-04 DIAGNOSIS — M542 Cervicalgia: Secondary | ICD-10-CM

## 2011-05-04 NOTE — Telephone Encounter (Signed)
Called pt and informed of appt for MRI 05-10-11 at 7:15 pm. Also, request for pt not to call everybody about her problem, it creates duplicate orders. She said, that she needs help asap about her pain. But, she promised not to do this in the future. Lorenda Hatchet, Renato Battles

## 2011-05-04 NOTE — Telephone Encounter (Signed)
Seeing a specilist. Requests order for MRI as for medicaid to pay for it, PCP needs to order it. Order in.

## 2011-05-04 NOTE — Telephone Encounter (Signed)
Spoke with pt. Informed that we have faxed request for referral to Neurologist and also to the headache center. She reports that she has an upcoming appt already 07-05-11 with the specialist. Wants to know about her MRI of her spine. I told the pt, that I do see an order in her chart and that I would schedule the MRI and will call her back with the appointment. Tasha Fernandez, Tasha Fernandez

## 2011-05-04 NOTE — Telephone Encounter (Signed)
Pt is asking about referral for her neck and spine

## 2011-05-04 NOTE — Telephone Encounter (Signed)
Called pt. Notes and request

## 2011-05-06 ENCOUNTER — Other Ambulatory Visit: Payer: Medicaid Other

## 2011-05-10 ENCOUNTER — Ambulatory Visit
Admission: RE | Admit: 2011-05-10 | Discharge: 2011-05-10 | Disposition: A | Discharge status: Home / Self Care | Payer: Medicaid Other | Source: Ambulatory Visit | Attending: Family Medicine | Admitting: Family Medicine

## 2011-05-10 DIAGNOSIS — M542 Cervicalgia: Secondary | ICD-10-CM

## 2011-05-11 ENCOUNTER — Telehealth: Payer: Self-pay | Admitting: Family Medicine

## 2011-05-11 NOTE — Telephone Encounter (Signed)
Pt is in pain with her jaw and wants to know if she can have something for pain.

## 2011-05-11 NOTE — Telephone Encounter (Signed)
Forward to PCP. Patient requesting something for pain.

## 2011-05-12 NOTE — Telephone Encounter (Signed)
Patient informed MRI basically normal per Dr. Denyse Amass and he will discuss results in more detail at her follow-up appt.  Also, informed patient that she will need office visit to be re-evaluated for jaw pain/possible pain med.  Offered appt for tomorrow or patient can go to urgent care/ED tonight if pain is intolerable.  Patient states she can wait until tomorrow and appt scheduled with [facility] for tomorrow morning.  Patient will come around 10:30am. Gaylene Brooks, RN

## 2011-05-12 NOTE — Telephone Encounter (Signed)
Pt is calling again about needing pain meds.  She is in a lot of pain today and it's getting worse.  Wants to know what to do. Also wants know about her MRI results

## 2011-05-13 ENCOUNTER — Ambulatory Visit (INDEPENDENT_AMBULATORY_CARE_PROVIDER_SITE_OTHER): Payer: Medicaid Other | Admitting: Family Medicine

## 2011-05-13 ENCOUNTER — Encounter: Payer: Self-pay | Admitting: Family Medicine

## 2011-05-13 VITALS — BP 126/86 | HR 114 | Ht 65.0 in | Wt 144.0 lb

## 2011-05-13 DIAGNOSIS — R6884 Jaw pain: Secondary | ICD-10-CM

## 2011-05-13 DIAGNOSIS — G8929 Other chronic pain: Secondary | ICD-10-CM

## 2011-05-13 DIAGNOSIS — F411 Generalized anxiety disorder: Secondary | ICD-10-CM

## 2011-05-13 MED ORDER — TRAMADOL HCL 50 MG PO TABS: 50.0000 mg | ORAL_TABLET | Freq: Two times a day (BID) | ORAL | Status: AC | PRN

## 2011-05-13 MED ORDER — BUSPIRONE HCL 10 MG PO TABS: 10.0000 mg | ORAL_TABLET | Freq: Three times a day (TID) | ORAL | Status: DC

## 2011-05-13 NOTE — Patient Instructions (Signed)
Thank you for coming in today. I am not sure why you hurt so bad.  None of the tests that we have done can explain your pain. Please talk to the neurologist in [facility] who is treating your jaw pain.  Ask him "Is there anybody that you know who does cortisone injections in the TM joint for pain relief?  Can you refer me to him or do you need my primary care doctor to do that?" I am also referring you to pain management. I am using tramadol twice a day for breakthrough pain.   See me in one month.  Please take the BuSpar  for anxiety.  Come back or go to the emergency room if you notice new weakness new numbness problems walking or bowel or bladder problems.

## 2011-05-13 NOTE — Assessment & Plan Note (Signed)
I am not sure of the cause of her jaw pain.  She has had multiple evaluations with specialists with no clear explanation. Additionally she's had trials for trigeminal neuralgia as well as TMJ.  I filled this point she should continue seeing her neurologist in [facility] for TMJ pain however she should also see a chronic pain specialist for further evaluation and management of her pain.  We'll provide tramadol for pain in the interim and refer to Pain Management  Dr. Cliffton Asters. Ivin Booty, MD Pain Medicine [address], [place], [place] [address]   followup in one month

## 2011-05-13 NOTE — Progress Notes (Signed)
Tasha Fernandez is a 41 y.o. female who presents to [facility] today for continued bilateral jaw pain.  Ms. Furukawa has bilateral jaw pain that radiates to her temporal regions and down to her neck.  She has been evaluated by multiple specialists and has had head imaging including head CT, TMJ MRI, and a recent cervical MRI all of which failed to identify a cause of pain.  She has a trial of treatment for trigeminal neuralgia and is on chronic duloxetine, gabapentin, and Flexeril for pain.  She is currently being treated by a specialist in TMJ at [facility]. She presents to clinic today with continued bad jaw pain.  She denies any fevers chills weakness numbness or significantly worse pain than her usual baseline.  She is very anxious about her pain.    Anxiety: Did not start buspirone as she lost the prescription.  Continues to remain anxious.   PMH, SH reviewed: Significant for jaw pain and chronic headache ROS as above otherwise neg. No Chest pain, palpitations, SOB, Fever, Chills, Abd pain, N/V/D.  Medications reviewed. Current Outpatient Prescriptions  Medication Sig Dispense Refill  . acetaminophen (TYLENOL) 500 MG tablet Take 1,000 mg by mouth every 6 (six) hours as needed. For headache      . Aspirin-Acetaminophen-Caffeine (GOODY HEADACHE PO) Take 1 packet by mouth every 6 (six) hours as needed. For headache      . busPIRone (BUSPAR) 10 MG tablet Take 1 tablet (10 mg total) by mouth 3 (three) times daily.  90 tablet  2  . cyclobenzaprine (FLEXERIL) 10 MG tablet Take 1 tablet (10 mg total) by mouth every 8 (eight) hours as needed for muscle spasms.  90 tablet  3  . DULoxetine (CYMBALTA) 60 MG capsule Take 1 capsule (60 mg total) by mouth 2 (two) times daily.  30 capsule  3  . gabapentin (NEURONTIN) 400 MG capsule Take 1 capsule (400 mg total) by mouth 3 (three) times daily.  90 capsule  6  . traMADol (ULTRAM) 50 MG tablet Take 1 tablet (50 mg total) by mouth every 12 (twelve) hours as needed for  pain.  60 tablet  1  . DISCONTD: montelukast (SINGULAIR) 10 MG tablet Take 1 tablet (10 mg total) by mouth daily.  30 tablet  2    Exam:  BP 126/86  Pulse 114  Ht 5\' 5"  (1.651 m)  Wt 144 lb (65.318 kg)  BMI 23.96 kg/m2  LMP 04/28/2011 Gen: Well NAD, anxious-appearing pressing on her jaws bilaterally multiple times during exam  HEENT: EOMI,  MMM normal appearing jaw bilaterally.

## 2011-05-13 NOTE — Assessment & Plan Note (Signed)
I think anxiety is a major contributor to her pain. Never started taking buspirone. Encouraged patient to start buspirone and followup in one month.

## 2011-05-16 ENCOUNTER — Telehealth: Payer: Self-pay | Admitting: Family Medicine

## 2011-05-16 ENCOUNTER — Encounter: Payer: Self-pay | Admitting: *Deleted

## 2011-05-16 NOTE — Telephone Encounter (Signed)
Called pt and left message.  Please tell pt, that we do not have a copy of her MRI image. Why does she need it? Lorenda Hatchet, Renato Battles

## 2011-05-16 NOTE — Telephone Encounter (Signed)
She is supposed to take the images to a pain specialist and is having a hard time getting it.

## 2011-05-16 NOTE — Telephone Encounter (Signed)
Needs a copy of the MRI image and she can't get it from the imaging place

## 2011-05-16 NOTE — Telephone Encounter (Signed)
Error

## 2011-05-16 NOTE — Telephone Encounter (Signed)
Spoke with patient, informed her she can call [facility] to get the images on a disc. Patient then talks about her headaches and asks to be referred to [facility] for her headaches and pain. I told her about the referral to the headache center and to pain management and she says those appointments are to long to wait for the amount of pain she is in, [facility] will be a much longer wait. I offered her a work in appointment but she declined.Sahvannah Rieser, Rodena Medin

## 2011-05-19 ENCOUNTER — Telehealth: Payer: Self-pay | Admitting: Family Medicine

## 2011-05-19 NOTE — Telephone Encounter (Signed)
Patient is calling requesting a referral to [facility].  She is hoping that they will be able to help her with this and will need her MRI's too.

## 2011-05-19 NOTE — Telephone Encounter (Signed)
We called pt several times. Robert explained to pt that pain center and headache center will call her with appt's. Also was told to pick up films from the place where she had it done. Lorenda Hatchet, Renato Battles

## 2011-05-25 ENCOUNTER — Telehealth: Payer: Self-pay | Admitting: Family Medicine

## 2011-05-25 ENCOUNTER — Ambulatory Visit: Payer: Medicaid Other | Admitting: Family Medicine

## 2011-05-25 NOTE — Telephone Encounter (Signed)
Patient is calling to check the status of the referral for Neurology.  She doesn't want to go to Pain Clinic because she says the pain pills are not enough she is going to need more injections in the joint.

## 2011-05-26 NOTE — Telephone Encounter (Signed)
Pt called again today asking about referral to neurology.  Wants to know when she can go.  PLEASE call and let her know something.

## 2011-05-26 NOTE — Telephone Encounter (Signed)
Pt has upcoming appt with Dr.Corey 06-01-11. I will fwd. To Dr.Corey. Please see previous messages. (may we tell pt to discuss at next OV?) .Tasha Fernandez

## 2011-05-27 ENCOUNTER — Encounter: Payer: Self-pay | Admitting: Physical Medicine and Rehabilitation

## 2011-05-27 ENCOUNTER — Telehealth: Payer: Self-pay | Admitting: *Deleted

## 2011-05-27 NOTE — Telephone Encounter (Signed)
Called pt and advised to keep appt with Dr. Denyse Amass to discuss all of her issues. Appointments are there to discuss referrals, plans and issues. Pt agreed and will keep appt to discuss issues. Marland KitchenLorenda Hatchet, Renato Battles

## 2011-05-27 NOTE — Telephone Encounter (Signed)
Pt has appt 06-17-11 at 9:00 am with Dr.[facility]. Info put in your box to share with pt at her upcoming appt. Tasha Fernandez, Renato Battles

## 2011-05-27 NOTE — Telephone Encounter (Signed)
I will discuss all these things with patient at scheduled office visits.

## 2011-06-01 ENCOUNTER — Ambulatory Visit: Payer: Medicaid Other | Admitting: Family Medicine

## 2011-06-09 ENCOUNTER — Ambulatory Visit: Payer: Medicaid Other | Admitting: Family Medicine

## 2011-06-09 ENCOUNTER — Other Ambulatory Visit: Payer: Self-pay | Admitting: Family Medicine

## 2011-06-09 DIAGNOSIS — M26629 Arthralgia of temporomandibular joint, unspecified side: Secondary | ICD-10-CM

## 2011-06-09 MED ORDER — CYCLOBENZAPRINE HCL 10 MG PO TABS: 10.0000 mg | ORAL_TABLET | Freq: Three times a day (TID) | ORAL | Status: DC | PRN

## 2011-06-17 ENCOUNTER — Encounter: Payer: Self-pay | Admitting: Physical Medicine and Rehabilitation

## 2011-06-17 ENCOUNTER — Encounter
Payer: Medicaid Other | Attending: Physical Medicine and Rehabilitation | Admitting: Physical Medicine and Rehabilitation

## 2011-06-17 VITALS — BP 124/82 | HR 120 | Resp 16 | Ht 65.5 in | Wt 140.0 lb

## 2011-06-17 DIAGNOSIS — M26609 Unspecified temporomandibular joint disorder, unspecified side: Secondary | ICD-10-CM | POA: Insufficient documentation

## 2011-06-17 DIAGNOSIS — G8929 Other chronic pain: Secondary | ICD-10-CM | POA: Insufficient documentation

## 2011-06-17 DIAGNOSIS — R070 Pain in throat: Secondary | ICD-10-CM | POA: Insufficient documentation

## 2011-06-17 DIAGNOSIS — H9209 Otalgia, unspecified ear: Secondary | ICD-10-CM | POA: Insufficient documentation

## 2011-06-17 DIAGNOSIS — F329 Major depressive disorder, single episode, unspecified: Secondary | ICD-10-CM | POA: Insufficient documentation

## 2011-06-17 DIAGNOSIS — R6884 Jaw pain: Secondary | ICD-10-CM | POA: Insufficient documentation

## 2011-06-17 DIAGNOSIS — M542 Cervicalgia: Secondary | ICD-10-CM

## 2011-06-17 DIAGNOSIS — R51 Headache: Secondary | ICD-10-CM | POA: Insufficient documentation

## 2011-06-17 DIAGNOSIS — F3289 Other specified depressive episodes: Secondary | ICD-10-CM | POA: Insufficient documentation
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
## 2011-06-17 NOTE — Patient Instructions (Signed)
Will see you back in one month I have ordered physical therapy to address your chronic neck pain.

## 2011-06-20 ENCOUNTER — Ambulatory Visit: Payer: Medicaid Other | Admitting: Physical Medicine and Rehabilitation

## 2011-06-21 ENCOUNTER — Telehealth: Payer: Self-pay | Admitting: Family Medicine

## 2011-06-21 ENCOUNTER — Telehealth: Payer: Self-pay | Admitting: Physical Medicine and Rehabilitation

## 2011-06-21 NOTE — Telephone Encounter (Signed)
Needs someone to call him in regards to this patients care.

## 2011-06-21 NOTE — Telephone Encounter (Signed)
Patient really wants to talk to Dr. Denyse Amass about some medications she is taking, Cymbalta, Tramadol and Neurontin.  Her mom read an article that said that those 3 drugs can cause severe joint pain and she is concerned at how that can affect her TMJ.

## 2011-06-22 NOTE — Telephone Encounter (Signed)
Lm for Tasha Fernandez in regards to his questions.

## 2011-06-23 NOTE — Telephone Encounter (Signed)
We will discuss these medications at the next visit. Continue to take them please.

## 2011-06-24 ENCOUNTER — Ambulatory Visit (INDEPENDENT_AMBULATORY_CARE_PROVIDER_SITE_OTHER): Payer: Medicaid Other | Admitting: Family Medicine

## 2011-06-24 ENCOUNTER — Encounter: Payer: Self-pay | Admitting: Family Medicine

## 2011-06-24 VITALS — BP 119/86 | HR 106 | Ht 65.5 in | Wt 143.0 lb

## 2011-06-24 DIAGNOSIS — G8929 Other chronic pain: Secondary | ICD-10-CM

## 2011-06-24 DIAGNOSIS — M26629 Arthralgia of temporomandibular joint, unspecified side: Secondary | ICD-10-CM

## 2011-06-24 DIAGNOSIS — IMO0002 Reserved for concepts with insufficient information to code with codable children: Secondary | ICD-10-CM

## 2011-06-24 DIAGNOSIS — F411 Generalized anxiety disorder: Secondary | ICD-10-CM

## 2011-06-24 DIAGNOSIS — R6884 Jaw pain: Secondary | ICD-10-CM

## 2011-06-24 DIAGNOSIS — M792 Neuralgia and neuritis, unspecified: Secondary | ICD-10-CM

## 2011-06-24 MED ORDER — CYCLOBENZAPRINE HCL 10 MG PO TABS: 10.0000 mg | ORAL_TABLET | Freq: Three times a day (TID) | ORAL | Status: DC | PRN

## 2011-06-24 MED ORDER — BUSPIRONE HCL 10 MG PO TABS: 10.0000 mg | ORAL_TABLET | Freq: Three times a day (TID) | ORAL | Status: DC

## 2011-06-24 MED ORDER — DULOXETINE HCL 20 MG PO CPEP: 20.0000 mg | ORAL_CAPSULE | Freq: Every day | ORAL | Status: DC

## 2011-06-24 NOTE — Assessment & Plan Note (Addendum)
I believe that Tasha Fernandez has an anxiety disorder. She has seen multiple specialists she did not suspect mania as the cause of her rapid speech.  This may be her personality or she may have an underlying anxiety disorder.  I think that she was doing better on Cymbalta that she is now.  Plan to restart Cymbalta and buspirone and followup in one or 2 weeks. Suicidal precautions reviewed  Total time spent with the patient was 30 minutes and more than 50% of it was face-to-face discussion

## 2011-06-24 NOTE — Patient Instructions (Signed)
Thank you for coming in today. Please restart the cymbalta at 20mg  daily.  Please restart the buspar three times a day.  Please use flexeril as needed.  Come back in 2 weeks.  We will work together on this issue I promise.  Let me know if you feel like hurting yourself or others.

## 2011-06-24 NOTE — Progress Notes (Signed)
Tasha Fernandez is a 41 y.o. female who presents to [facility] today for   1) jaw pain: Present for 2 years and continued since the last visit.  The patient has been to multiple specialists for this issue without any resolution.  Most recently she was referred to pain management who saw her May 10.  Pain management recommended followup in one month and potential secondary referral to the second pain management Dr.  Ms. Fernandez continues to experience significant life disruption pain.  She has had multiple trials of medications all without significant benefit she is very distressed by this pain.The pain is  worse today. Pain is located in both jaws both lateral neck, and at the base of the posterior skull.  She denies any radiation to her arms difficulty walking weakness or numbness.  2) anxiety: Related to her pain.  Tasha Fernandez stop taking the BuSpar, Cymbalta and Neurontin Out of fear that it was harming her.  She notes that she has worsened since stopping this medication.  She denies any suicidal or homicidal ideation. She has been evaluated by a psychiatrist recently who was not able to provide a specific diagnosis. Mood disorder questionnaire is 7 today.   PMH: Reviewed significant for chronic TMJ pain  History  Substance Use Topics  . Smoking status: Never Smoker   . Smokeless tobacco: Never Used  . Alcohol Use: Yes     social   ROS as above  Medications reviewed. Current Outpatient Prescriptions  Medication Sig Dispense Refill  . acetaminophen (TYLENOL) 500 MG tablet Take 1,000 mg by mouth every 6 (six) hours as needed. For headache      . cyclobenzaprine (FLEXERIL) 10 MG tablet Take 1 tablet (10 mg total) by mouth every 8 (eight) hours as needed for muscle spasms.  90 tablet  3  . DISCONTD: cyclobenzaprine (FLEXERIL) 10 MG tablet Take 1 tablet (10 mg total) by mouth every 8 (eight) hours as needed for muscle spasms.  90 tablet  3  . Aspirin-Acetaminophen-Caffeine (GOODY HEADACHE PO) Take 1  packet by mouth every 6 (six) hours as needed. For headache      . busPIRone (BUSPAR) 10 MG tablet Take 1 tablet (10 mg total) by mouth 3 (three) times daily.  90 tablet  2  . DULoxetine (CYMBALTA) 20 MG capsule Take 1 capsule (20 mg total) by mouth daily.  30 capsule  11  . DISCONTD: montelukast (SINGULAIR) 10 MG tablet Take 1 tablet (10 mg total) by mouth daily.  30 tablet  2    Exam:  BP 119/86  Pulse 106  Ht 5' 5.5" (1.664 m)  Wt 143 lb (64.864 kg)  BMI 23.43 kg/m2 Gen: Well NAD, anxious-appearing.  Patient constantly pressing on her jaw and lateral neck.   HEENT: EOMI,  MMM  psych: Normal eye contact anxious affect.  Rapid and pressured speech significantly perseverating on chronic pain.   I had to interrupt Tasha Fernandez multiple times during the visit . No delusions or hallucinations expressed.  No SI/HI.

## 2011-06-24 NOTE — Assessment & Plan Note (Signed)
I am not sure of the cause of her jaw pain.  She has had multiple evaluations with specialists with no clear explanation. Additionally she's had trials for trigeminal neuralgia as well as TMJ.  I filled this point she should continue seeing her neurologist in [facility] restrainer for TMJ pain however she should also see a chronic pain specialist for further evaluation and management of her pain.  She has been to pain management he recommends followup but possibly referral to a second pain management Dr.  Today she seems to be having an exacerbation of her chronic pain. I believe this is secondary to stopping Cymbalta. Plan to restart low dose Cymbalta with titration of the next 2 weeks. Patient expresses understanding.

## 2011-06-27 ENCOUNTER — Telehealth: Payer: Self-pay | Admitting: Family Medicine

## 2011-06-27 NOTE — Telephone Encounter (Signed)
Called patient back she did not answer.  LM stating that she could call us back.

## 2011-06-27 NOTE — Telephone Encounter (Signed)
Call patient regarding trigger point injections

## 2011-06-29 ENCOUNTER — Telehealth: Payer: Self-pay | Admitting: Family Medicine

## 2011-06-29 NOTE — Telephone Encounter (Signed)
[facility] fax # is [phone].  They are wanting Medical Records.  I let her know that she needed to complete the ROI.

## 2011-07-01 ENCOUNTER — Ambulatory Visit (INDEPENDENT_AMBULATORY_CARE_PROVIDER_SITE_OTHER): Payer: Medicaid Other | Admitting: Family Medicine

## 2011-07-01 ENCOUNTER — Encounter: Payer: Self-pay | Admitting: Family Medicine

## 2011-07-01 VITALS — BP 129/81 | HR 120 | Temp 99.1°F | Ht 65.5 in | Wt 145.0 lb

## 2011-07-01 DIAGNOSIS — R51 Headache: Secondary | ICD-10-CM

## 2011-07-01 DIAGNOSIS — M999 Biomechanical lesion, unspecified: Secondary | ICD-10-CM

## 2011-07-01 DIAGNOSIS — M9981 Other biomechanical lesions of cervical region: Secondary | ICD-10-CM

## 2011-07-01 MED ORDER — KETOROLAC TROMETHAMINE 60 MG/2ML IM SOLN
60.0000 mg | Freq: Once | INTRAMUSCULAR | Status: AC
  Administered 2011-07-01: 60 mg via INTRAMUSCULAR

## 2011-07-01 NOTE — Patient Instructions (Signed)
Good to see you I am giving you a toradol  I want you to take the cymbalta and the buspar as prescribed, I really think it will help. I think you should do the exercises the message therapist gave you You should come back in 4-6 weeks.

## 2011-07-03 NOTE — Progress Notes (Signed)
  Subjective:    Patient ID: Tasha Fernandez, female    DOB: 19-Jul-1970, 41 y.o.   MRN: [id number]  HPI  41 year old female coming in with chronic headaches and neuropathic pain. Patient has been seeing me for the potential OMT treatment to help patient's pain. 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 Patient states that the pain usually starts in the bitemporal lobes potentially as well as right behind the ears or the jaw jaw and more on the left than the right. This pain that radiates down the neck to the shoulders and mostly the left one. Pt denies any weakness or numbness in the extremity.  Patient was also started on SSRI and buspar for her anxiety recently by her PCP which I do feel is definitely contributing to her physical complaints. Patient though is still not taking this medication regularly and she is trying it slowly she states.    Review of Systems  Denies fevers chills vision changes or weakness in the extremities.    Objective:   Physical Exam  Filed Vitals:   07/01/11 1449  BP: 129/81  Pulse: 120  Temp: 99.1 F (37.3 C)    Gen: Well NAD, anxious and uncomfortable appearing Face: Pain along the TMJ with mouth closed and open mostly left-sided. Pain on mouth range of motion.  No deviation of jaw with opening OMT Findings: Cervical: C4 flexed rotated and side bent left C5 flexed rotated and side bent right Thoracic: T7 extended rotated and side bent left Lumbar: L4 flexed rotated and side bent right Sacrum: Left on left    Assessment & Plan:

## 2011-07-03 NOTE — Assessment & Plan Note (Signed)
Decision today to treat with OMT was based on Physical Exam  After verbal consent patient was treated with MEtechniques in cervical areas  Patient tolerated the procedure well with improvement in symptoms  Patient given exercises, stretches and lifestyle modifications  See medications in patient instructions if given  Patient will follow up in 4 weeks

## 2011-07-03 NOTE — Assessment & Plan Note (Signed)
Discussed at length, given exercises, encouraged message therapy,  Discussed potential diet modifications,  Encouraged patient to take medications on a regular basis.  OMT today.  RTC in 4 weeks.

## 2011-07-06 ENCOUNTER — Ambulatory Visit: Payer: Medicaid Other | Admitting: Family Medicine

## 2011-07-07 ENCOUNTER — Emergency Department (HOSPITAL_COMMUNITY)
Admission: EM | Admit: 2011-07-07 | Discharge: 2011-07-07 | Disposition: A | Discharge status: Home / Self Care | Payer: Medicaid Other | Attending: Emergency Medicine | Admitting: Emergency Medicine

## 2011-07-07 ENCOUNTER — Encounter (HOSPITAL_COMMUNITY): Payer: Self-pay | Admitting: Emergency Medicine

## 2011-07-07 DIAGNOSIS — R51 Headache: Secondary | ICD-10-CM | POA: Insufficient documentation

## 2011-07-07 DIAGNOSIS — M26629 Arthralgia of temporomandibular joint, unspecified side: Secondary | ICD-10-CM | POA: Insufficient documentation

## 2011-07-07 DIAGNOSIS — G8929 Other chronic pain: Secondary | ICD-10-CM | POA: Insufficient documentation

## 2011-07-07 DIAGNOSIS — M542 Cervicalgia: Secondary | ICD-10-CM | POA: Insufficient documentation

## 2011-07-07 DIAGNOSIS — F411 Generalized anxiety disorder: Secondary | ICD-10-CM | POA: Insufficient documentation

## 2011-07-07 DIAGNOSIS — M2540 Effusion, unspecified joint: Secondary | ICD-10-CM | POA: Insufficient documentation

## 2011-07-07 MED ORDER — KETOROLAC TROMETHAMINE 60 MG/2ML IM SOLN
60.0000 mg | Freq: Once | INTRAMUSCULAR | Status: AC
  Administered 2011-07-07: 60 mg via INTRAMUSCULAR
  Filled 2011-07-07: qty 2

## 2011-07-07 MED ORDER — DEXAMETHASONE SODIUM PHOSPHATE 10 MG/ML IJ SOLN
10.0000 mg | Freq: Once | INTRAMUSCULAR | Status: AC
  Administered 2011-07-07: 10 mg via INTRAMUSCULAR
  Filled 2011-07-07: qty 1

## 2011-07-07 MED ORDER — HYDROCODONE-IBUPROFEN 7.5-200 MG PO TABS: 1.0000 | ORAL_TABLET | Freq: Four times a day (QID) | ORAL | Status: DC | PRN

## 2011-07-07 NOTE — ED Notes (Signed)
Patient complain of chronic  bilateral jaw pain that started 2 years ago

## 2011-07-07 NOTE — ED Provider Notes (Signed)
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 Reports pain radiates to her neck and head. States she sees Dr. Katrinka Blazing and he has referred her to a neurologist. Reports she's also receiving Botox injections without relief. States she is here because she is "tired of hurting and would just like a little bit of relief "denies change in pain otherwise. Denies fever sore throat, difficulty swallowing, shortness of, numbness, tingling, or weakness.  The history is provided by the patient.    Past Medical History  Diagnosis Date  . Seasonal allergies   . Anxiety   . TMJ (dislocation of temporomandibular joint)     Past Surgical History  Procedure Date  . Cholecystectomy   . Cesarean section   . Tubal ligation     No family history on file.  History  Substance Use Topics  . Smoking status: Never Smoker   . Smokeless tobacco: Never Used  . Alcohol Use: Yes     social    OB History    Grav Para Term Preterm Abortions TAB SAB Ect Mult Living                  Review of Systems  Constitutional: Negative for fever and chills.  HENT: Positive for neck pain and neck stiffness. Negative for hearing loss, ear pain, congestion, sore throat, facial swelling, rhinorrhea, trouble swallowing, dental problem, voice change and postnasal drip.        Jaw pain   Respiratory: Negative for shortness of breath.   Cardiovascular: Negative for chest pain.  Gastrointestinal: Negative for nausea.  Musculoskeletal: Positive for joint swelling. Negative for back pain.  Neurological: Positive for headaches. Negative for dizziness, speech difficulty, weakness, light-headedness and numbness.  All other systems reviewed and are negative.    Allergies  Review of patient's allergies indicates no known allergies.  Home Medications   Current Outpatient  Rx  Name Route Sig Dispense Refill  . GOODY HEADACHE PO Oral Take 1 packet by mouth every 6 (six) hours as needed. For headache    . BUSPIRONE HCL 10 MG PO TABS Oral Take 1 tablet (10 mg total) by mouth 3 (three) times daily. 90 tablet 2  . CYCLOBENZAPRINE HCL 10 MG PO TABS Oral Take 10 mg by mouth every 8 (eight) hours as needed.    . DULOXETINE HCL 20 MG PO CPEP Oral Take 1 capsule (20 mg total) by mouth daily. 30 capsule 11    BP 108/83  Pulse 100  Temp(Src) 98.6 F (37 C) (Oral)  Resp 20  SpO2 100%  LMP 06/16/2011  Physical Exam  Vitals reviewed. Constitutional: She is oriented to person, place, and time. Vital signs are normal. She appears well-developed and well-nourished. No distress.  HENT:  Head: Normocephalic and atraumatic.    Eyes: Pupils are equal, round, and reactive to light.  Neck: Neck supple.  Pulmonary/Chest: Effort normal.  Neurological: She is alert and oriented to person, place, and time.  Skin: Skin is warm and dry. No rash noted. No erythema. No pallor.  Psychiatric: She has a normal mood and affect. Her behavior is normal.    ED Course  Procedures  MDM    With the patient Toradol and Decadron shot and discharged with 12 pills for Vicoprofen advised patient discussed  with Dr. Katrinka Blazing tomorrow that she had to come to the emergency department due to severe pain. Advised she should have a breakthrough pain plan. Patient voices understanding and is ready for     Thomasene Lot, Cordelia Poche 07/07/11 2024

## 2011-07-07 NOTE — ED Notes (Signed)
Pt presented to the ER with c/p generalized chronic pain, however, states that the most sever pain is to her jaw, pt seen at pain clinic, also treated for TMJ and given Botox injection, no relief

## 2011-07-07 NOTE — ED Notes (Signed)
Patient discharge via ambulatory with a steady gait. Respirations equal and unlabored. Skin warm and dry. No acute distress noted. 

## 2011-07-07 NOTE — Discharge Instructions (Signed)
Temporomandibular Joint Pain Your exam shows that you have a problem with your temporomandibular joint (TMJ), the joint that moves when you open your mouth or chew food. TMJ problems can result from direct injuries, bite abnormalities, or tension states which cause you to grind or clench your teeth. Typical symptoms include pain around the joint, clicking, restricted movement, and headaches. The TMJ is like any other joint in the body; when it is strained, it needs rest to repair itself. To keep the joint at rest it is important that you do not open your mouth wider than the width of your index finger. If you must yawn, be sure to support your chin with your hand so your mouth does not open wide. Eat a soft diet (nothing firmer than ground beef, no raw vegetables), do not chew gum and do not talk if it causes you pain. Apply topical heat by using a warm, moist cloth placed in front of the ear for 15 to 20 minutes several times daily. Alternating heat and ice may give even more relief. Anti-inflammatory pain medicine and muscle relaxants can also be helpful. A dental orthotic or splint may be used for temporary relief. Long-term problems may require treatment for stress as well as braces or surgery. Please check with your doctor or dentist if your symptoms do not improve within one week. Document Released: 03/03/2004 Document Revised: 01/13/2011 Document Reviewed: 01/24/2005 [facility] Patient Information 2012 [place], [place].  Chronic Pain Management Managing chronic pain is not easy. The goal is to provide as much pain relief as possible. There are emotional as well as physical problems. Chronic pain may lead to symptoms of depression which magnify those of the pain. Problems may include:  Anxiety.   Sleep disturbances.   Confused thinking.   Feeling cranky.   Fatigue.   Weight gain or loss.  Identify the source of the pain first, if possible. The pain may be masking another problem. Try to find  a pain management specialist or clinic. Work with a team to create a treatment plan for you. MEDICATIONS  May include narcotics or opioids. Larger than normal doses may be needed to control your pain.   Drugs for depression may help.   Over-the-counter medicines may help for some conditions. These drugs may be used along with others for better pain relief.   May be injected into sites such as the spine and joints. Injections may have to be repeated if they wear off.  THERAPY MAY INCLUDE:  Working with a physical therapist to keep from getting stiff.   Regular, gentle exercise.   Cognitive or behavioral therapy.   Using complementary or integrative medicine such as:   Acupuncture.   Massage, Reiki, or Rolfing.   Aroma, color, light, or sound therapy.   Group support.  FOR MORE INFORMATION [email or web address]. American Chronic Pain Association [email or web address]. Document Released: 03/03/2004 Document Revised: 01/13/2011 Document Reviewed: 04/12/2007 [facility] Patient Information 2012 [place], [place].

## 2011-07-08 ENCOUNTER — Encounter: Payer: Self-pay | Admitting: Family Medicine

## 2011-07-08 ENCOUNTER — Ambulatory Visit (INDEPENDENT_AMBULATORY_CARE_PROVIDER_SITE_OTHER): Payer: Medicaid Other | Admitting: Family Medicine

## 2011-07-08 VITALS — BP 122/86 | HR 123 | Temp 98.4°F | Ht 65.5 in | Wt 147.0 lb

## 2011-07-08 DIAGNOSIS — M9981 Other biomechanical lesions of cervical region: Secondary | ICD-10-CM

## 2011-07-08 DIAGNOSIS — IMO0002 Reserved for concepts with insufficient information to code with codable children: Secondary | ICD-10-CM

## 2011-07-08 DIAGNOSIS — G8929 Other chronic pain: Secondary | ICD-10-CM

## 2011-07-08 DIAGNOSIS — M792 Neuralgia and neuritis, unspecified: Secondary | ICD-10-CM

## 2011-07-08 DIAGNOSIS — R6884 Jaw pain: Secondary | ICD-10-CM

## 2011-07-08 DIAGNOSIS — M999 Biomechanical lesion, unspecified: Secondary | ICD-10-CM

## 2011-07-08 MED ORDER — DULOXETINE HCL 20 MG PO CPEP: 40.0000 mg | ORAL_CAPSULE | Freq: Every day | ORAL | Status: DC

## 2011-07-08 MED ORDER — PREDNISONE 50 MG PO TABS: ORAL_TABLET | ORAL | Status: DC

## 2011-07-08 NOTE — Patient Instructions (Signed)
It is good to see you. I'm sorry you're still having some much pain. I when she to increase her Cymbalta to 40 mg daily. I'm giving you some prednisone. I want you to take 50 mg daily for 5 days. I also when she to ice your job for 20 minutes 3 times a day for the next 3 days no matter what. This weekend you are going to lock herself in the room and not talk to anyone. This is critical you need to rest your jaw.

## 2011-07-09 ENCOUNTER — Encounter: Payer: Self-pay | Admitting: Family Medicine

## 2011-07-09 NOTE — Progress Notes (Signed)
Patient ID: Tasha Fernandez, female   DOB: 12/28/1970, 41 y.o.   MRN: [id number]  Subjective:    Patient ID: Tasha Fernandez, female    DOB: 1970-08-19, 41 y.o.   MRN: [id number]  HPI 41 year old female coming in with chronic headaches and neuropathic pain. Patient has been seeing me for the potential OMT treatment to help patient's pain. 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 Patient states she has seen a surgeon recently who said they would not do any intervention. This is made patient much more angry and think she is in more pain. Patient states that the pain usually starts in the bitemporal lobes potentially as well as right behind the ears or the jaw jaw and more on the left than the right. This pain that radiates down the neck to the shoulders and mostly the left one. Pt denies any weakness or numbness in the extremity.  Patient was also started on SSRI and buspar for her anxiety recently by her PCP which I do feel is definitely contributing to her physical complaints. Patient though is still not taking this medication regularly and she is trying it slowly she states.  Patient does not believe his work and believes the only way to relieve her pain and anxiety would be to have surgery.   Review of Systems Denies fevers chills vision changes or weakness in the extremities.    Objective:   Physical Exam Filed Vitals:   07/08/11 1431  BP: 143/100  Pulse: 123  Temp: 98.4 F (36.9 C)    Gen: Well NAD, anxious and uncomfortable appearing will not stop talking Face: Pain along the TMJ with mouth closed and open mostly left-sided. Pain on mouth range of motion.  No deviation of jaw with opening OMT Findings: Cervical: C5 flexed rotated and side bent right Thoracic: T7 extended rotated and side bent left Patient does have trigger points along the left first rib. Lumbar: L4 flexed rotated and side bent right     Assessment & Plan:

## 2011-07-09 NOTE — Assessment & Plan Note (Signed)
Muscle energy manipulation done today and after verbal consent patient was also given trigger point injections after being prepped with Betadine and alcohol swabs injected with a total of 3 cc of lidocaine in 4 different areas no blood loss patient tolerated procedure well

## 2011-07-09 NOTE — Assessment & Plan Note (Signed)
Patient continues to have this problem and continues to see specialist on her own accord. Do not know how compliant the patient is with treatment at this time. Patient has been looking for potential disability, I am concerned there is some secondary gain with this chronic pain as well. Have attempted OMT multiple times with minimal improvement. Attempted to discuss this again with patient who interrupted multiple times. At this point told her she needs to rest her jaw and not potentially use it is much such as talking Encourage icing Increase patient's Cymbalta Did 5 day dose of prednisone. Followup with PCP

## 2011-07-12 ENCOUNTER — Ambulatory Visit: Payer: Medicaid Other | Attending: Physical Medicine and Rehabilitation | Admitting: Rehabilitation

## 2011-07-12 DIAGNOSIS — IMO0001 Reserved for inherently not codable concepts without codable children: Secondary | ICD-10-CM | POA: Insufficient documentation

## 2011-07-12 DIAGNOSIS — M542 Cervicalgia: Secondary | ICD-10-CM | POA: Insufficient documentation

## 2011-07-14 NOTE — ED Provider Notes (Signed)
Medical screening examination/treatment/procedure(s) were performed by non-physician practitioner and as supervising physician I was immediately available for consultation/collaboration.  Cashay Manganelli, MD 07/14/11 0240 

## 2011-07-15 ENCOUNTER — Ambulatory Visit (INDEPENDENT_AMBULATORY_CARE_PROVIDER_SITE_OTHER): Payer: Medicaid Other | Admitting: Family Medicine

## 2011-07-15 ENCOUNTER — Encounter: Payer: Self-pay | Admitting: Family Medicine

## 2011-07-15 VITALS — BP 120/86 | HR 114 | Ht 65.5 in | Wt 145.9 lb

## 2011-07-15 DIAGNOSIS — R6884 Jaw pain: Secondary | ICD-10-CM

## 2011-07-15 DIAGNOSIS — IMO0002 Reserved for concepts with insufficient information to code with codable children: Secondary | ICD-10-CM

## 2011-07-15 DIAGNOSIS — G8929 Other chronic pain: Secondary | ICD-10-CM

## 2011-07-15 DIAGNOSIS — M792 Neuralgia and neuritis, unspecified: Secondary | ICD-10-CM

## 2011-07-15 DIAGNOSIS — F411 Generalized anxiety disorder: Secondary | ICD-10-CM

## 2011-07-15 MED ORDER — HYDROCODONE-IBUPROFEN 7.5-200 MG PO TABS: 1.0000 | ORAL_TABLET | Freq: Three times a day (TID) | ORAL | Status: AC | PRN

## 2011-07-15 MED ORDER — DULOXETINE HCL 60 MG PO CPEP: 60.0000 mg | ORAL_CAPSULE | Freq: Every day | ORAL | Status: DC

## 2011-07-15 NOTE — Patient Instructions (Addendum)
Thank you for coming in today. Please see the oral doctor to get your wisdom teeth out.  Take 1 vicoprofen as needed for pain.  I will try to get you to the other pain doctor.  Start taking 60mg  of cymbalta tomorrow.  Please come back in about 1 month.

## 2011-07-18 ENCOUNTER — Encounter: Payer: Self-pay | Admitting: Family Medicine

## 2011-07-18 ENCOUNTER — Telehealth: Payer: Self-pay | Admitting: *Deleted

## 2011-07-18 ENCOUNTER — Encounter: Payer: Medicaid Other | Admitting: Physical Medicine and Rehabilitation

## 2011-07-18 ENCOUNTER — Telehealth: Payer: Self-pay | Admitting: Family Medicine

## 2011-07-18 NOTE — Assessment & Plan Note (Signed)
Continues to be anxious.  Plan to increase cymbalta to 60 and continue buspar. Will f/u in 1 month.

## 2011-07-18 NOTE — Progress Notes (Signed)
Tasha Fernandez is a 41 y.o. female who presents to [facility] today for   1) Chronic Jaw Pain: Ms Prisk returns to discuss her chronic BL jaw pain. In the interval her jaw pain worsened and she presented to the ED and to a work in visit with Dr. Terrilee Files. She had both Vicoprofen and Prednisone at these visits. She noted that the prednisone and Vicoprofen provided significant pain relief. She would like more prednisone. She notes that the cymbalta does not help much.  She also notes that she has been considering a wisdom tooth extraction as per recommended by a oral surgeon a year ago.   Additionally she has not been to the 2nd pain management doctor yet.   2) Mood: She remains distressed and anxious. She is taking cymbalta 40 and buspar as listed below. She attributes all her mood issues to her pain. She denies any SI/HI.     PMH: Reviewed significant for jaw pain.  History  Substance Use Topics  . Smoking status: Never Smoker   . Smokeless tobacco: Never Used  . Alcohol Use: Yes     social   ROS as above  Medications reviewed. Current Outpatient Prescriptions  Medication Sig Dispense Refill  . busPIRone (BUSPAR) 10 MG tablet Take 1 tablet (10 mg total) by mouth 3 (three) times daily.  90 tablet  2  . cyclobenzaprine (FLEXERIL) 10 MG tablet Take 10 mg by mouth every 8 (eight) hours as needed.      . DULoxetine (CYMBALTA) 60 MG capsule Take 1 capsule (60 mg total) by mouth daily.  30 capsule  2  . HYDROcodone-ibuprofen (VICOPROFEN) 7.5-200 MG per tablet Take 1 tablet by mouth every 8 (eight) hours as needed for pain.  30 tablet  0  . DISCONTD: montelukast (SINGULAIR) 10 MG tablet Take 1 tablet (10 mg total) by mouth daily.  30 tablet  2    Exam:  BP 120/86  Pulse 114  Ht 5' 5.5" (1.664 m)  Wt 145 lb 14.4 oz (66.18 kg)  BMI 23.91 kg/m2  LMP 07/13/2011 Gen: Well NAD HEENT: EOMI,  MMM. No obvious dental carries. Mildly ttp over TMJ Bl. No palpable clicks. Normal ear canals and TMs BL.    Lungs: CTABL Nl WOB Heart: RRR no MRG Psych: Anxious affect. Speech is rapid/pressured and she perseverates on her jaw pain.  No Si/HI  No results found for this or any previous visit (from the past 72 hour(s)).

## 2011-07-18 NOTE — Telephone Encounter (Signed)
Patient states she did not receive RX for vicoprofen . It looks like MD printed and gave RX to her. Advised patient that I will need to check with Dr. Denyse Amass and call her back.  States having jaw pain and she thinks it is her wisdom teeth but dentist states this is not where pain is coming from.

## 2011-07-18 NOTE — Assessment & Plan Note (Addendum)
Possibly tooth related. I think that a wisdom tooth extraction is reasonable. As she had significant pain relief with vicoprofen will do a 1 month trial of 30 tablets and f/u in 1 month. Warned Ms Ocanas that the next doctor may not continue prescribing it.  As per pain management appt on May 10th will refer to 2nd pain doctor Dr. Ollen Bowl at [facility].

## 2011-07-18 NOTE — Telephone Encounter (Signed)
Patient is calling because [facility] on [address] is saying they have not gotten the Rx's that were sent on Friday 6/7.

## 2011-07-18 NOTE — Telephone Encounter (Signed)
Will need to contact Dr. Denyse Amass about this since it appears RX was printed . Patient notified that we will call her back later today.

## 2011-07-19 ENCOUNTER — Ambulatory Visit: Payer: Medicaid Other | Admitting: Physical Therapy

## 2011-07-20 NOTE — Telephone Encounter (Signed)
Called the pharmacy she has not turned in the Vicoprofen prescription yet.  I suspect she lost it. We'll continue to follow

## 2011-07-21 ENCOUNTER — Ambulatory Visit: Payer: Medicaid Other | Admitting: Family Medicine

## 2011-07-22 ENCOUNTER — Encounter
Payer: Medicaid Other | Attending: Physical Medicine and Rehabilitation | Admitting: Physical Medicine and Rehabilitation

## 2011-07-22 NOTE — Telephone Encounter (Signed)
Needs to know about Vicoprofen.  Pharmacy states that they have not rec'd script.

## 2011-07-22 NOTE — Telephone Encounter (Signed)
Spoke with patient and I advised her that Dr. Denyse Amass stated he printed out RX when she was here for office visit and gave to her.  She will look for it now.

## 2011-07-25 ENCOUNTER — Encounter
Payer: Medicaid Other | Attending: Physical Medicine and Rehabilitation | Admitting: Physical Medicine and Rehabilitation

## 2011-07-27 ENCOUNTER — Encounter
Payer: Medicaid Other | Attending: Physical Medicine and Rehabilitation | Admitting: Physical Medicine and Rehabilitation

## 2011-07-27 ENCOUNTER — Encounter: Payer: Self-pay | Admitting: Physical Medicine and Rehabilitation

## 2011-07-27 VITALS — BP 123/83 | HR 107 | Resp 16 | Ht 65.5 in | Wt 145.0 lb

## 2011-07-27 DIAGNOSIS — R51 Headache: Secondary | ICD-10-CM | POA: Insufficient documentation

## 2011-07-27 DIAGNOSIS — F3289 Other specified depressive episodes: Secondary | ICD-10-CM | POA: Insufficient documentation

## 2011-07-27 DIAGNOSIS — M542 Cervicalgia: Secondary | ICD-10-CM

## 2011-07-27 DIAGNOSIS — H9209 Otalgia, unspecified ear: Secondary | ICD-10-CM | POA: Insufficient documentation

## 2011-07-27 DIAGNOSIS — F329 Major depressive disorder, single episode, unspecified: Secondary | ICD-10-CM | POA: Insufficient documentation

## 2011-07-27 DIAGNOSIS — R6884 Jaw pain: Secondary | ICD-10-CM | POA: Insufficient documentation

## 2011-07-27 DIAGNOSIS — G8929 Other chronic pain: Secondary | ICD-10-CM | POA: Insufficient documentation

## 2011-07-27 MED ORDER — MELOXICAM 15 MG PO TABS: 15.0000 mg | ORAL_TABLET | Freq: Every day | ORAL | Status: DC
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
## 2011-07-27 NOTE — Patient Instructions (Signed)
Start PT , start taking Mobic. Continue with walking.

## 2011-07-29 ENCOUNTER — Ambulatory Visit: Payer: Medicaid Other | Admitting: Family Medicine

## 2011-08-05 ENCOUNTER — Ambulatory Visit: Payer: Medicaid Other | Admitting: Family Medicine

## 2011-08-10 ENCOUNTER — Telehealth: Payer: Self-pay | Admitting: Physical Medicine & Rehabilitation

## 2011-08-10 NOTE — Telephone Encounter (Signed)
Patient requesting trigger point injections at her next visit with Clydie Braun.  Patient states she is in a lot of pain and was told she could have them at her next appt.

## 2011-08-10 NOTE — Telephone Encounter (Signed)
Is this okay with you? Please advise, thanks.

## 2011-08-15 ENCOUNTER — Encounter: Payer: Self-pay | Admitting: Family Medicine

## 2011-08-15 ENCOUNTER — Ambulatory Visit (INDEPENDENT_AMBULATORY_CARE_PROVIDER_SITE_OTHER): Payer: Medicaid Other | Admitting: Family Medicine

## 2011-08-15 VITALS — BP 128/94 | HR 96 | Ht 65.0 in | Wt 144.3 lb

## 2011-08-15 DIAGNOSIS — R6884 Jaw pain: Secondary | ICD-10-CM

## 2011-08-15 DIAGNOSIS — F411 Generalized anxiety disorder: Secondary | ICD-10-CM

## 2011-08-15 DIAGNOSIS — R51 Headache: Secondary | ICD-10-CM

## 2011-08-15 DIAGNOSIS — G8929 Other chronic pain: Secondary | ICD-10-CM

## 2011-08-15 DIAGNOSIS — IMO0002 Reserved for concepts with insufficient information to code with codable children: Secondary | ICD-10-CM

## 2011-08-15 DIAGNOSIS — M792 Neuralgia and neuritis, unspecified: Secondary | ICD-10-CM

## 2011-08-15 MED ORDER — CYCLOBENZAPRINE HCL 10 MG PO TABS: 10.0000 mg | ORAL_TABLET | Freq: Three times a day (TID) | ORAL | Status: DC | PRN

## 2011-08-15 NOTE — Telephone Encounter (Signed)
That's ok.

## 2011-08-15 NOTE — Patient Instructions (Addendum)
Thank you for coming in today, it was nice to meet you I would like for you to try the flexeril for your neck/jaw pain I will put in a referral for the neurologist for your headaches. Hopefully the current treatment you are undergoing will be beneficial for your pain.

## 2011-08-17 ENCOUNTER — Telehealth: Payer: Self-pay

## 2011-08-17 ENCOUNTER — Telehealth: Payer: Self-pay | Admitting: Family Medicine

## 2011-08-17 MED ORDER — DULOXETINE HCL 60 MG PO CPEP: 60.0000 mg | ORAL_CAPSULE | Freq: Every day | ORAL | Status: DC

## 2011-08-17 NOTE — Telephone Encounter (Signed)
Called pt.

## 2011-08-17 NOTE — Telephone Encounter (Signed)
Wants to know if referral has been placed and when she can go  Dr Ashley Royalty was also supposed to call in her Cymbalta - [facility] [employer]

## 2011-08-17 NOTE — Telephone Encounter (Signed)
Pt request Rx for Cymbalta and referral to Neurologist from Dr.Matthews. Fwd. To Dr.Matthews. Lorenda Hatchet, Renato Battles

## 2011-08-17 NOTE — Telephone Encounter (Signed)
Pt informed trigger points could be done at next appt per Karens last message.

## 2011-08-17 NOTE — Telephone Encounter (Signed)
Completed.

## 2011-08-21 NOTE — Assessment & Plan Note (Signed)
Refer back to [facility], unsure if they will see her as she is being followed by [facility].

## 2011-08-21 NOTE — Assessment & Plan Note (Signed)
Unclear cause of jaw pain.  Botox injections helping some.  Will attempt to obtain outside records from [facility].

## 2011-08-21 NOTE — Progress Notes (Signed)
  Subjective:    Patient ID: Tasha Fernandez, female    DOB: 12-13-1970, 41 y.o.   MRN: [id number]  HPI  1.  TMJ;  Has been dealing with TMJ pain x2 years.  Has seen multiple specialists regarding this problem including ENT, chiropractor and oral surgeon.  Most recently has been seen by neuro at [facility] where she has been receiving botox injections.  She is also being considered for diagnosis of trigeminal neuralgia.  She states she has been compliant with home treatments and use of night guard.  She states neurology has also referred to neurosx, she is unsure why.    2.  Chronic HA:  She thinks related to TMJ pain.  Has been evaluated at [facility] for this before and would like to be referred back to discuss further treatment options.  She denies dizziness, weakness associated with this.   Review of Systems Per HPI    Objective:   Physical Exam  Constitutional: No distress.  HENT:       No crepitus or grinding palpated at TMJ.  No tenderness at TMJ.  Muscle strength 5/5.  Neck: Neck supple.  Cardiovascular: Normal rate and regular rhythm.           Assessment & Plan:

## 2011-08-24 ENCOUNTER — Encounter
Payer: Medicaid Other | Attending: Physical Medicine and Rehabilitation | Admitting: Physical Medicine and Rehabilitation

## 2011-08-24 ENCOUNTER — Encounter: Payer: Self-pay | Admitting: Physical Medicine and Rehabilitation

## 2011-08-24 VITALS — BP 110/68 | HR 103 | Resp 14 | Ht 65.0 in | Wt 146.0 lb

## 2011-08-24 DIAGNOSIS — M542 Cervicalgia: Secondary | ICD-10-CM

## 2011-08-24 NOTE — Patient Instructions (Signed)
Try to keep a good posture, start PT
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
## 2011-08-29 ENCOUNTER — Telehealth: Payer: Self-pay | Admitting: Family Medicine

## 2011-08-29 NOTE — Telephone Encounter (Signed)
Need referral to [facility] at [phone] or [phone]

## 2011-08-30 NOTE — Telephone Encounter (Signed)
Stated she was being followed by Neuro[facility] at [facility] during last visit.  She does not need to be seen at both.

## 2011-08-31 NOTE — Telephone Encounter (Signed)
Called patient she is requesting a referral to [facility] for headaches and neck pain. I told patient she cannot she two different neurologist at the same time, she says the one at [facility] only sees her for the botox injections he does not treat her for her headaches. She also admits to seeing a neurosurgeon at [facility] that does not treat her for her headaches and cannot help her, "she must be seen at [facility]." Also patient is requesting refills on cymbalta, mobic, hydrocodone, and tramadol. Will forward message to PCP with patient request.Busick, Rodena Medin

## 2011-09-02 NOTE — Telephone Encounter (Signed)
Patient is calling to check on the status of the refills on Cymbalta, Mobic, Hydrocodone and Tramadol.

## 2011-09-02 NOTE — Telephone Encounter (Signed)
Patient is also asking about the status of the referral to [facility].

## 2011-09-02 NOTE — Telephone Encounter (Addendum)
[facility] is a neurosurgery not neurology office and they do not specialize in headaches.  Dr. Denyse Amass had already placed a referral in June to [facility] and result was:  "Referral was denied, Dr Ollen Bowl does not feel he has anything additional to offer this patient, suggest she may need to have her wisdom teeth removed.Rodena Medin Busick, CMA"    She was advised to follow up with [facility] for her headaches.  She is also followed by neurologist and neurosurgery at [facility].  Also followed by center for pain management.  I am not going to refer her further because it is going to complicate things with her receiving care from so many different places.  Regarding refills, I have not received any refill requests for her.   Looks like mobic prescribed by Pain management on 7/17.  Med rec from 5/30 shows she reported that she stopped taking cymbalta. She needs to contact her pharmacy to have them send in refill requests.

## 2011-09-05 NOTE — Telephone Encounter (Signed)
Patient returned call and was given message from Dr Ashley Royalty that she could not be referred to another neurologist, patient continued to complain on the phone about her pain and demanding a referral to [facility], I told her the best I could do for her at this point is schedule her an appointment with PCP to discuss pain and referral options. Also patient requesting meds including narcotics, I told her she must call her pharmacy and they will send the request.Tylasia Fletchall, Rodena Medin

## 2011-09-07 ENCOUNTER — Other Ambulatory Visit: Payer: Self-pay | Admitting: Family Medicine

## 2011-09-07 ENCOUNTER — Telehealth: Payer: Self-pay | Admitting: Physical Medicine and Rehabilitation

## 2011-09-07 DIAGNOSIS — G8929 Other chronic pain: Secondary | ICD-10-CM

## 2011-09-07 MED ORDER — HYDROCODONE-ACETAMINOPHEN 10-325 MG PO TABS: 1.0000 | ORAL_TABLET | Freq: Three times a day (TID) | ORAL | Status: AC | PRN

## 2011-09-07 MED ORDER — HYDROCODONE-ACETAMINOPHEN 10-325 MG PO TABS: 1.0000 | ORAL_TABLET | Freq: Three times a day (TID) | ORAL | Status: DC | PRN

## 2011-09-07 MED ORDER — TRAMADOL HCL 50 MG PO TABS: 50.0000 mg | ORAL_TABLET | Freq: Two times a day (BID) | ORAL | Status: AC | PRN

## 2011-09-07 MED ORDER — MELOXICAM 15 MG PO TABS: 15.0000 mg | ORAL_TABLET | Freq: Every day | ORAL | Status: DC

## 2011-09-07 MED ORDER — CYCLOBENZAPRINE HCL 10 MG PO TABS: 10.0000 mg | ORAL_TABLET | Freq: Three times a day (TID) | ORAL | Status: DC | PRN

## 2011-09-07 NOTE — Telephone Encounter (Signed)
We do not treat her jaw and face pain, all her imagings were without significant findings, I ordered PT with modalities for pain relief,because he pain is most likely muscular, is she doing this ? Is she seeing a pain psychologist or psychiatrist, if not we could referr her.

## 2011-09-07 NOTE — Telephone Encounter (Signed)
Please, please, please.  Someone help her with her pain.  Hurts in neck, jaws, face.  Burning.  Please someone help her.

## 2011-09-07 NOTE — Telephone Encounter (Signed)
Please advise 

## 2011-09-07 NOTE — Telephone Encounter (Signed)
Pt aware of this and wants to know if she can do injections?

## 2011-09-08 NOTE — Telephone Encounter (Signed)
Appearently the injections did not help, she should do the PT as prescribed, her problems are muscular, and PT with modalities would be a good treatment for her, makes no sense, repeating injections if they did not help at all

## 2011-09-09 NOTE — Telephone Encounter (Signed)
Pt aware that she needs to complete PT.

## 2011-09-13 ENCOUNTER — Ambulatory Visit: Payer: Medicaid Other | Admitting: Family Medicine

## 2011-09-21 ENCOUNTER — Ambulatory Visit (INDEPENDENT_AMBULATORY_CARE_PROVIDER_SITE_OTHER): Payer: Medicaid Other | Admitting: Family Medicine

## 2011-09-21 ENCOUNTER — Encounter: Payer: Self-pay | Admitting: Family Medicine

## 2011-09-21 VITALS — BP 143/97 | HR 108 | Temp 98.8°F | Ht 65.0 in | Wt 144.0 lb

## 2011-09-21 DIAGNOSIS — G8929 Other chronic pain: Secondary | ICD-10-CM

## 2011-09-21 DIAGNOSIS — R6884 Jaw pain: Secondary | ICD-10-CM

## 2011-09-21 MED ORDER — CYCLOBENZAPRINE HCL 10 MG PO TABS: 10.0000 mg | ORAL_TABLET | Freq: Three times a day (TID) | ORAL | Status: DC | PRN

## 2011-09-21 MED ORDER — MELOXICAM 15 MG PO TABS: 15.0000 mg | ORAL_TABLET | Freq: Every day | ORAL | Status: DC

## 2011-09-21 NOTE — Patient Instructions (Signed)
Please keep your appointment with your oral surgeon, we will call to see if we can get this move to a sooner date. Hopefully your pain will improve with wisdom teeth removal

## 2011-09-22 ENCOUNTER — Telehealth: Payer: Self-pay | Admitting: Family Medicine

## 2011-09-22 ENCOUNTER — Encounter
Payer: Medicaid Other | Attending: Physical Medicine and Rehabilitation | Admitting: Physical Medicine and Rehabilitation

## 2011-09-22 NOTE — Telephone Encounter (Signed)
Pt called back. Re: mobic and flexeril. She did not receive it. I called both meds into [facility] on [address]. I also told the pt that I have sent her request for Lyrica to Dr.Matthews Fwd.  Lorenda Hatchet, Renato Battles

## 2011-09-22 NOTE — Telephone Encounter (Signed)
fwd

## 2011-09-22 NOTE — Telephone Encounter (Signed)
Wants to know if Dr Ashley Royalty is going to call in her Lyrica

## 2011-09-23 ENCOUNTER — Telehealth: Payer: Self-pay | Admitting: *Deleted

## 2011-09-23 ENCOUNTER — Telehealth: Payer: Self-pay | Admitting: Physical Medicine and Rehabilitation

## 2011-09-23 NOTE — Telephone Encounter (Signed)
Notified Tasha Fernandez that she will have to get her referral from her PCP per Dr Pamelia Hoit.

## 2011-09-23 NOTE — Telephone Encounter (Signed)
Missed appt .  Very sorry.  Forgot all about it with everything else going on .  Meant to call and cancel.  Would like to be referred to the one who does the blocks.

## 2011-09-23 NOTE — Telephone Encounter (Signed)
See message below.  MS Brzozowski is talking about wanting to be referred for cervical injections.

## 2011-09-23 NOTE — Telephone Encounter (Signed)
"  Dr. Denyse Amass had already placed a referral in June to [facility] and result was:    "Referral was denied, Dr Ollen Bowl does not feel he has anything additional to offer this patient, suggest she may need to have her wisdom teeth removed.Rodena Medin Busick, CMA" "  Our PM&R practice does not do cervical injections.  I did not realize her referral to [facility] had been denied.  She missed recent appt with me.  For futher referrals she will need to talk to her PCP.

## 2011-09-25 NOTE — Assessment & Plan Note (Signed)
I think having her wisdom teeth removed may be very helpful in distinguishing whether this is truly dental pain vs tmj pain.  Hopefully she will feel some relief after extraction of wisdom teeth.  For now, continue current medical therapy.

## 2011-09-25 NOTE — Progress Notes (Signed)
  Subjective:    Patient ID: Tasha Fernandez, female    DOB: Aug 14, 1970, 41 y.o.   MRN: [id number]  HPI 1. Chronic jaw and head pain:  Here with continued pain.  Has seen multiple specialists including neurologist, neurosx, oral surgery and ENT.  Current therapy is NSAID, Muscle relaxant, and botox injections.  Has tried gabapentin and cymbalta in the past with no help.  Thinks that pain is related to TMJ dysfunction, however also questions whether tooth pain may be contributing.  She states that she had a filling fall out of an upper molar x2 years ago.  Now she believes she has an "exposed nerve"  She states that she has been recommended to have her wisdom teeth removed by Dr. Barbette Merino and has an appt on 9/20 to have this done.  She is hoping this will give her some pain relief.   Review of Systems Per HPI    Objective:   Physical Exam  Constitutional: She appears well-nourished. No distress.  HENT:  Head: Normocephalic and atraumatic.  Mouth/Throat: Oropharynx is clear and moist.       Able to open mouth fully.  No deviation of jaw.  No popping/grinding with palpation of TMJ joint.   Dentition is fair.  There are some eroded teeth in upper left side.  No abscess seen.  Neck: Neck supple. No thyromegaly present.          Assessment & Plan:

## 2011-09-27 NOTE — Telephone Encounter (Signed)
Discussed possibility of starting lyrica if no improvement after dental procedure.  Will wait until after wisdom tooth extraction to see if pain improves.

## 2011-09-29 ENCOUNTER — Telehealth: Payer: Self-pay | Admitting: *Deleted

## 2011-09-29 NOTE — Telephone Encounter (Signed)
Received call from  [facility]  asking for our  NPI and authorization  for patient to be seen there.  It is noted that patient has been in our office several times recently . Advised will have to get OK from PCP , Dr. Ashley Royalty.

## 2011-10-03 NOTE — Telephone Encounter (Signed)
Ok to give NPI.  I am not sure if chiropractor is going to be able to help condition a whole lot.

## 2011-10-03 NOTE — Telephone Encounter (Signed)
NPI given to [facility].

## 2011-10-05 ENCOUNTER — Ambulatory Visit (INDEPENDENT_AMBULATORY_CARE_PROVIDER_SITE_OTHER): Payer: Medicaid Other | Admitting: Family Medicine

## 2011-10-05 ENCOUNTER — Encounter: Payer: Self-pay | Admitting: Family Medicine

## 2011-10-05 VITALS — BP 119/79 | HR 111 | Ht 65.5 in | Wt 147.0 lb

## 2011-10-05 DIAGNOSIS — M542 Cervicalgia: Secondary | ICD-10-CM

## 2011-10-05 MED ORDER — TRAMADOL HCL 50 MG PO TABS: 50.0000 mg | ORAL_TABLET | Freq: Four times a day (QID) | ORAL | Status: AC | PRN

## 2011-10-05 MED ORDER — PREGABALIN 75 MG PO CAPS: 75.0000 mg | ORAL_CAPSULE | Freq: Two times a day (BID) | ORAL | Status: DC

## 2011-10-05 NOTE — Patient Instructions (Signed)
Thank you for coming in today, it was good to see you Hopefully you will get some relief after tooth extraction I am going to start you on lyrica to see if that helps with the pain in your neck. You can make an appointment with Dr. Berline Chough for manipulation I will place a referral to PT for you as well.

## 2011-10-09 DIAGNOSIS — G894 Chronic pain syndrome: Secondary | ICD-10-CM | POA: Insufficient documentation

## 2011-10-11 ENCOUNTER — Telehealth: Payer: Self-pay | Admitting: *Deleted

## 2011-10-11 ENCOUNTER — Ambulatory Visit: Payer: Medicaid Other | Admitting: Family Medicine

## 2011-10-11 ENCOUNTER — Other Ambulatory Visit: Payer: Self-pay | Admitting: Family Medicine

## 2011-10-11 NOTE — Telephone Encounter (Signed)
PA required for Lyrica. Form placed in MD box. 

## 2011-10-11 NOTE — Assessment & Plan Note (Signed)
Possible radicular pain, difficult to tell due to her perception of pain all over her neck and head.  Unsure of exact etiology.  Will give her a trial of lyrica as adjunct to current therapy rx'ed by specialists.  I again explained to her that I would not refer her further as I'm not sure she would gain much benefit and her treatment plan will get blurred if we continue to add more specialists. Advised to see Dr. Berline Chough at our clinic if she would like to see if manipulation helps.

## 2011-10-11 NOTE — Telephone Encounter (Signed)
Will fwd. To Dr.Matthews. Please help. Tasha Fernandez, Tasha Fernandez

## 2011-10-11 NOTE — Progress Notes (Signed)
  Subjective:    Patient ID: Tasha Fernandez, female    DOB: 1970-08-17, 41 y.o.   MRN: [id number]  HPI 1. Neck pain:  Patient here with complaint of neck pain.  Pain mostly on R side.  Patient states that she was recently at her neurologist and he did "some things and lifted her leg and said she had a spinal problem in her neck".  He also told her he may do a "nerve block through her nose"  To help with her chronic headaches and neck pain.  She does report that the pain radiated down her arm from her neck as well and she reports some numbness that comes and goes.  Her main reason for visit today is for referral to chiropractor.  Has received manipulation in our clinic before and this has helped some.  She denies decreased strength in arm.  Has tried gabapentin and tegretol in the past for her chronic pain and neither have helped much.  Has never tried lyrica.    Review of Systems Per HPI    Objective:   Physical Exam  Constitutional: No distress.  HENT:  Head: Normocephalic and atraumatic.  Neck: Normal range of motion. Neck supple.       Negative spurlings.   Musculoskeletal:       Grip 5/5 bilaterally.  Sensation intact bilaterally.   Neurological: She is alert.          Assessment & Plan:

## 2011-10-11 NOTE — Telephone Encounter (Signed)
Patient is calling to let Dr. Ashley Royalty know that [facility] on [address] says he has to call the Rx for Lyrica in, that a printed Rx won't do.  She is hoping this can be done today.

## 2011-10-12 NOTE — Telephone Encounter (Signed)
Approval received from [facility] for Lyrica. Patient notified. She will contact  Pharmacy .

## 2011-10-14 ENCOUNTER — Ambulatory Visit (INDEPENDENT_AMBULATORY_CARE_PROVIDER_SITE_OTHER): Payer: Medicaid Other | Admitting: Family Medicine

## 2011-10-14 ENCOUNTER — Encounter: Payer: Self-pay | Admitting: Family Medicine

## 2011-10-14 ENCOUNTER — Other Ambulatory Visit (HOSPITAL_COMMUNITY)
Admission: RE | Admit: 2011-10-14 | Discharge: 2011-10-14 | Disposition: A | Discharge status: Home / Self Care | Payer: Medicaid Other | Source: Ambulatory Visit | Attending: Family Medicine | Admitting: Family Medicine

## 2011-10-14 ENCOUNTER — Encounter: Payer: Medicaid Other | Admitting: Family Medicine

## 2011-10-14 VITALS — BP 118/79 | HR 112 | Ht 65.5 in | Wt 148.0 lb

## 2011-10-14 DIAGNOSIS — R300 Dysuria: Secondary | ICD-10-CM

## 2011-10-14 DIAGNOSIS — Z113 Encounter for screening for infections with a predominantly sexual mode of transmission: Secondary | ICD-10-CM | POA: Insufficient documentation

## 2011-10-14 DIAGNOSIS — N898 Other specified noninflammatory disorders of vagina: Secondary | ICD-10-CM

## 2011-10-14 LAB — POCT URINALYSIS DIPSTICK
Bilirubin, UA: NEGATIVE
Nitrite, UA: POSITIVE
Protein, UA: NEGATIVE
Urobilinogen, UA: 0.2
pH, UA: 6

## 2011-10-14 LAB — POCT WET PREP (WET MOUNT): Clue Cells Wet Prep Whiff POC: NEGATIVE

## 2011-10-14 NOTE — Patient Instructions (Addendum)
Thank you for coming in today, it was good to see you I will call you with results of your labs when they return.

## 2011-10-18 ENCOUNTER — Telehealth: Payer: Self-pay | Admitting: Family Medicine

## 2011-10-18 DIAGNOSIS — G8929 Other chronic pain: Secondary | ICD-10-CM

## 2011-10-18 NOTE — Telephone Encounter (Signed)
Pt is asking for results of her tests the other day

## 2011-10-19 MED ORDER — CEPHALEXIN 500 MG PO CAPS: 500.0000 mg | ORAL_CAPSULE | Freq: Four times a day (QID) | ORAL | Status: AC

## 2011-10-19 MED ORDER — CYCLOBENZAPRINE HCL 10 MG PO TABS: 10.0000 mg | ORAL_TABLET | Freq: Three times a day (TID) | ORAL | Status: DC | PRN
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
## 2011-10-19 NOTE — Telephone Encounter (Signed)
Forward to PCP.

## 2011-10-20 NOTE — Telephone Encounter (Signed)
Lyrica has been approved   and pharmacy states it has gone through. Patient notified.

## 2011-10-21 ENCOUNTER — Ambulatory Visit (INDEPENDENT_AMBULATORY_CARE_PROVIDER_SITE_OTHER): Payer: Medicaid Other | Admitting: Sports Medicine

## 2011-10-21 ENCOUNTER — Encounter: Payer: Self-pay | Admitting: Sports Medicine

## 2011-10-21 VITALS — BP 119/72 | HR 123 | Temp 98.6°F | Ht 65.5 in | Wt 146.0 lb

## 2011-10-21 DIAGNOSIS — M999 Biomechanical lesion, unspecified: Secondary | ICD-10-CM

## 2011-10-21 DIAGNOSIS — M9981 Other biomechanical lesions of cervical region: Secondary | ICD-10-CM

## 2011-10-21 DIAGNOSIS — R51 Headache: Secondary | ICD-10-CM

## 2011-10-21 DIAGNOSIS — G8929 Other chronic pain: Secondary | ICD-10-CM

## 2011-10-21 DIAGNOSIS — R6884 Jaw pain: Secondary | ICD-10-CM

## 2011-10-21 DIAGNOSIS — F411 Generalized anxiety disorder: Secondary | ICD-10-CM

## 2011-10-21 NOTE — Patient Instructions (Addendum)
It was nice meet you today.  Google the "CranioCradle"  This may help your pain to use.  Do the stretching exercises with the towel three times a day.    Follow up in 4 weeks.

## 2011-10-23 NOTE — Assessment & Plan Note (Signed)
Sent for wet prep, gc/chlamydia.  No lesions seen on exam to send for HSV culture.

## 2011-10-23 NOTE — Assessment & Plan Note (Signed)
UA collected.  WIll treat as needed based on results.

## 2011-10-23 NOTE — Progress Notes (Signed)
  Subjective:    Patient ID: Tasha Fernandez, female    DOB: 18-Jan-1971, 41 y.o.   MRN: [id number]  HPI 1.  Vaginal irritation:  Has had vaginal discharge for "a few days" along with "a blister" in her vaginal area.  She states she has a history of herpes and her last outbreak was when she was in high school. States blister was painful but does not really notice now. She does endorse some dysuria and frequency as well.  She has had normal menstrual periods.  She denies vaginal bleeding, abdominal or flank pain.    Review of Systems Per HPI    Objective:   Physical Exam  Nursing note reviewed. Constitutional: She appears well-nourished. No distress.  Genitourinary: Vagina normal and uterus normal. Cervix exhibits no motion tenderness. Right adnexum displays no tenderness and no fullness. Left adnexum displays no tenderness and no fullness.       Minimal discharge. No blisters, ulcerations or other lesions seen.   Neurological: She is alert.          Assessment & Plan:

## 2011-10-26 ENCOUNTER — Encounter: Payer: Medicaid Other | Admitting: Family Medicine

## 2011-10-26 ENCOUNTER — Other Ambulatory Visit: Payer: Self-pay | Admitting: Family Medicine

## 2011-10-27 NOTE — Progress Notes (Signed)
  [facility]  Patient name: Tasha Fernandez MRN [id number]  Date of birth: May 03, 1970  CC & HPI:  Tasha Fernandez is a 41 y.o. female presenting today for manipulation for TMJ and chronic neck pain.  She reports this is been a long ongoing problem and she's been evaluated multiple times by multiple specialists for her TMJ discomfort.  Just reports chronic headaches and chronic neck pain.  She denies any radicular symptoms, no locking or popping of her jaw this time.  No numbness or tingling.  ROS:  Per history of present illness  Pertinent History Reviewed:  Medical & Surgical Hx:  Reviewed: Significant for chronic headaches neuropathic pain anxiety, GERD, dysuria, vaginal irritation Medications: Reviewed & Updated - see associated section Social History: Reviewed - Significant for nonsmoker  Objective Findings:  Vitals:  Filed Vitals:   10/21/11 1110  BP: 119/72  Pulse: 123  Temp: 98.6 F (37 C)    PE: GENERAL:  Caucasian Adult female. In no discomfort; no respiratory distress.  Altruistic. MSK EXAM:  Cervical range of motion Symmetrically  restricted in rotation, extension and side bending bilaterally approximately 10-15 in all planes.  Upper extremity DTRs 2+ out of 4 bilaterally upper Treace strength is 5+ out of 5 in all of her chart myotomes Osteopathic Exam: CERVICAL   C2 F- RS left.    C5 F.- RS right     THORACIC   T4 rotated left   T7 rotated right   RIBS    LUMBAR    SACRUM   left on left sacral torsion   PELVIS    LE    UE         Assessment & Plan:

## 2011-10-27 NOTE — Assessment & Plan Note (Addendum)
Likely a highly contributing factor to her chronic headaches.  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

## 2011-10-27 NOTE — Assessment & Plan Note (Signed)
Decision today to treat with OMT was based on Physical Exam  After verbal consent patient was treated with HVLA, ME, MRF techniques in Cervical, Thoracic, Sacrum areas  Patient tolerated the procedure well with improvement in symptoms  Patient given exercises, stretches and lifestyle modifications  See medications in patient instructions if given  Patient will follow up in 4 weeks if benefit from treatment

## 2011-10-27 NOTE — Assessment & Plan Note (Addendum)
I agree with continued treatment by Neurology given no clear anatomic abnormality.  The cannot agreed to chronic pain medications for this patient's condition this time as there's no identifiable cause of her pain.  Significant time was spent with this patient discussing roll of physical therapy, home exercises, medications, other specialists, her overall condition in general.  Greater than 50% of this 25 minute visit was spent in direct counseling with this patient.

## 2011-11-14 ENCOUNTER — Telehealth: Payer: Self-pay | Admitting: Family Medicine

## 2011-11-14 ENCOUNTER — Ambulatory Visit (INDEPENDENT_AMBULATORY_CARE_PROVIDER_SITE_OTHER): Payer: Medicaid Other | Admitting: Sports Medicine

## 2011-11-14 ENCOUNTER — Encounter: Payer: Self-pay | Admitting: Sports Medicine

## 2011-11-14 VITALS — BP 121/78 | HR 107 | Temp 97.9°F | Ht 65.5 in | Wt 148.0 lb

## 2011-11-14 DIAGNOSIS — R6884 Jaw pain: Secondary | ICD-10-CM

## 2011-11-14 DIAGNOSIS — G8929 Other chronic pain: Secondary | ICD-10-CM

## 2011-11-14 DIAGNOSIS — M999 Biomechanical lesion, unspecified: Secondary | ICD-10-CM

## 2011-11-14 DIAGNOSIS — M9981 Other biomechanical lesions of cervical region: Secondary | ICD-10-CM

## 2011-11-14 NOTE — Assessment & Plan Note (Addendum)
Pt continues to perseverates on her TMJ pain and does not recall many details of the extensive conversation spent last visit reviewing home treatment options including stretching and neck mobilization exercises  Pt requesting referral to an additional Neurologist in [facility] because she's heard they could "fix all of her jaw pain".  Discussed I do not feel this is medically necessary especially in the setting of already having a neurologist.  Requested she follow up with her PCP to further discuss pain management options including referral to additional pain medicine clinics.  Significant time was spent with this patient discussing roll of physical therapy, home exercises, medications, other specialists, and her overall condition in general and low likelihood of cure of her "jaw" pain.  Greater than 50% of this 25 minute visit was spent in direct counseling with this patient. Will refer to PT for neck ROM and stregthening

## 2011-11-14 NOTE — Assessment & Plan Note (Signed)
Decision today to treat with OMT was based on Physical Exam  After verbal consent patient was treated with HVLA, ME, and MFR techniques in cervical, thoracic, ribs, sacrum & pelvis areas  Patient tolerated the procedure well with improvement in symptoms  Patient given exercises, stretches and lifestyle modifications  See medications in patient instructions if given  Patient will follow up prn for further treatments; 1 month at the earliest.

## 2011-11-14 NOTE — Progress Notes (Signed)
  [facility]  Patient name: Tasha Fernandez MRN [id number]  Date of birth: 1970-09-23  CC & HPI:  Tasha Fernandez is a 41 y.o. female presenting today for manipulation for her chronic jaw pain.  Reports last manipulation was helpful and would like this repeated.  Has not been doing the stretching exercises we spent extensive time reviewed last visit.  Is interested in PT.    ROS:  No changes in vision, persistent headaches.  No locking or popping of her jaw  Pertinent History Reviewed:  Medical & Surgical Hx:  Reviewed: Significant for anxiety, chronic jaw pain, neuropathic pain Medications: Reviewed & Updated - see associated section Social History: Reviewed - Significant for non smoker  Objective Findings:  Vitals:  Filed Vitals:   11/14/11 1349  BP: 121/78  Pulse: 107  Temp: 97.9 F (36.6 C)    PE: GENERAL: Caucasian Adult female. In no discomfort; no respiratory distress. Altruistic.  MSK EXAM: Cervical range of motion Symmetrically restricted in rotation, extension and side bending bilaterally approximately 10-15 in all planes.  Jaw opens with cervical extension. strength is 5+ out of 5 in all of her UE myotomes  Osteopathic Exam:  CERVICAL AA rotated R C2 F- RS left.  C5 F.- RS right  THORACIC  T4 rotated left  T7 rotated right   RIBS  Posterior R Rib 8  LUMBAR    SACRUM  L on L  sacral torsion   PELVIS    LE    UE     .      Assessment & Plan:

## 2011-11-15 ENCOUNTER — Telehealth: Payer: Self-pay | Admitting: *Deleted

## 2011-11-15 NOTE — Telephone Encounter (Signed)
LVM for patient to call back. She has an appointment on 11/8 with Dr. Berline Chough as a follow up. "Patient is only to see Dr. Berline Chough for manipulations". If she is following up on an issue from last visit she will need to see her PCP

## 2011-11-15 NOTE — Telephone Encounter (Deleted)
Called pt.

## 2011-11-16 NOTE — Telephone Encounter (Signed)
Called and confirmed with pt that Dr. Berline Chough will ONLY DO MANIPULATIONS FOR HER.  She will have to follow up with Dr. Ashley Royalty for her other concerns. Pt is asking for som pain meds. She is having severe jaw pain and thus causing her to have headaches along with it. I told her that I would forward this note to Dr. Ashley Royalty and I could not guarantee that she will get any meds without a OV. Pt voiced understanding and agreed.Loralee Pacas [place]

## 2011-11-19 DIAGNOSIS — R51 Headache: Secondary | ICD-10-CM | POA: Insufficient documentation

## 2011-11-19 DIAGNOSIS — R519 Headache, unspecified: Secondary | ICD-10-CM | POA: Insufficient documentation

## 2011-11-21 NOTE — Telephone Encounter (Signed)
No additional pain medications will be prescribed at this time.

## 2011-11-23 ENCOUNTER — Ambulatory Visit: Payer: Medicaid Other | Admitting: Family Medicine

## 2011-11-29 ENCOUNTER — Ambulatory Visit: Payer: Medicaid Other | Admitting: Family Medicine

## 2011-12-01 ENCOUNTER — Telehealth: Payer: Self-pay | Admitting: *Deleted

## 2011-12-01 NOTE — Telephone Encounter (Signed)
Called and spoke with pt and told her that she cannot be seen by Dr. Berline Chough until she follows up with Dr. Ashley Royalty. Pt stated that she forgot about the appt b/c she could not afford the co pay and was in so much pain. I told her that I would reschedule her to see Dr. Ashley Royalty and she then asked me about if he could call in some pain medications I told her that she will need to be seen by her pcp for this. appt has been made for her on 10.25.2013 @ 315 pm as a SDA appt and pt informed that she would only be seen for medication refills ONLY. Pt voiced understanding however she proceeded to ask me if Dr. Ashley Royalty would do her referral at that appt. I told her that b/c of the type of appt I scheduled her for I could NOT guarantee that he would talk to her about the referral at that time. Pt again voiced understanding and agreed to the appt. I will forward this message to Dr. Ashley Royalty.Loralee Pacas [place]

## 2011-12-02 ENCOUNTER — Ambulatory Visit: Payer: Medicaid Other | Admitting: Family Medicine

## 2011-12-16 ENCOUNTER — Ambulatory Visit: Payer: Medicaid Other | Admitting: Sports Medicine

## 2011-12-22 ENCOUNTER — Ambulatory Visit (INDEPENDENT_AMBULATORY_CARE_PROVIDER_SITE_OTHER): Payer: Medicaid Other | Admitting: Family Medicine

## 2011-12-22 ENCOUNTER — Encounter: Payer: Self-pay | Admitting: Family Medicine

## 2011-12-22 VITALS — BP 127/84 | HR 102 | Temp 98.5°F | Ht 65.5 in | Wt 143.0 lb

## 2011-12-22 DIAGNOSIS — G8929 Other chronic pain: Secondary | ICD-10-CM

## 2011-12-22 DIAGNOSIS — M542 Cervicalgia: Secondary | ICD-10-CM

## 2011-12-22 DIAGNOSIS — R6884 Jaw pain: Secondary | ICD-10-CM

## 2011-12-22 MED ORDER — PREGABALIN 75 MG PO CAPS: 75.0000 mg | ORAL_CAPSULE | Freq: Two times a day (BID) | ORAL | Status: DC

## 2011-12-22 MED ORDER — CERVICAL COLLAR MISC: Status: DC

## 2011-12-22 MED ORDER — MELOXICAM 15 MG PO TABS: 15.0000 mg | ORAL_TABLET | Freq: Every day | ORAL | Status: DC

## 2011-12-22 NOTE — Patient Instructions (Signed)
Thank you for coming in today, it was good to see you Try the soft cervical collar I will put in a referral to [facility] I have refilled your meloxicam Pick up the lyrica and start.

## 2011-12-23 ENCOUNTER — Telehealth: Payer: Self-pay | Admitting: Family Medicine

## 2011-12-23 NOTE — Telephone Encounter (Signed)
Wanting to know about the neurosurgery referral and and headache speacialist. Where is she at in the process?Marland KitchenMarland KitchenMarland KitchenLoralee Pacas Fernandez

## 2011-12-25 NOTE — Assessment & Plan Note (Signed)
Unclear etiology.  Advised to try lyrical.  Given rx for soft cervical collar to see if this provides some relief as well.

## 2011-12-25 NOTE — Assessment & Plan Note (Signed)
Unsure of etiology.  Quite non-compliant with prescribed therapies.  Refilled meloxicam, will not continue narcotics.  Reprinted rx for lyrica, advised to try.  Will refer back to [facility] neurosurgery but no other referrals as I do not believe it would add any additional help and treatment courses could get complicated or redundant.

## 2011-12-25 NOTE — Progress Notes (Signed)
  Subjective:    Patient ID: Tasha Fernandez, female    DOB: 1970/11/22, 41 y.o.   MRN: [id number]  HPI  1.  Jaw/Neck pain:  Comes in with continued complaint of jaw and neck pain.  Has been referred to multiple specialists. She has been seen by Dr. Rosezella Florida at [facility],  [facility], Dr. Dan Humphreys with [facility], Dr. Angelyn Punt with [facility], Dr. Barbette Merino with Oral surgery and Dr. Leonette Nutting with [facility].  She has received multiple treatments for the condition and nothing has been really helpful.  She has not kept followup with many of her specialists and is mostly non compliant with her treatments other than narcotic pain medications and muscle relaxants.  She never picked up lyrica, attended PT or followed up with Dr. Barbette Merino for tooth extraction  She would like a referral  To a new neurologist, pain medicine doctor and back to neursurgery at [facility] to discuss epidural spine injections.  Her pain is is inconsistent in her description each visit, which she describes in multiple ways ranging from throbbing and grinding in her jaw to "electric shocks" throughout her face.  Denies rash anywhere, chest pain, shortness of breath, other joint pain or myalgias.   Review of Systems Per HPI    Objective:   Physical Exam  Constitutional: She appears well-nourished.       Disheveled appearing  HENT:  Head: Normocephalic and atraumatic.       Some cavities in upper teeth, no abscess seen.  Pain with palpation over TMJ joint, minimal grinding.   Spurlings negative bilaterally.   Mild upper cervical spasm on L.  Neck: Neck supple.  Lymphadenopathy:    She has no cervical adenopathy.          Assessment & Plan:

## 2011-12-26 NOTE — Telephone Encounter (Signed)
Pt called back to give info - [facility] - [phone]

## 2011-12-26 NOTE — Telephone Encounter (Signed)
Called pt re: Neurosurgery referral. I told the pt to find out, WHERE she would like to go, because she has been with many specialists. She will call us back with info. Lorenda Hatchet, Renato Battles

## 2011-12-27 ENCOUNTER — Ambulatory Visit: Payer: Medicaid Other | Admitting: Family Medicine

## 2012-01-04 ENCOUNTER — Telehealth: Payer: Self-pay | Admitting: Family Medicine

## 2012-01-04 NOTE — Telephone Encounter (Signed)
Will fwd. To Dr.Chambliss for review. Thank you. Lorenda Hatchet, Renato Battles

## 2012-01-04 NOTE — Telephone Encounter (Signed)
Patient is calling to check on the status of the referral for the Sp[facility] at [facility].  She left a phone number last week, but now she isn't sure that is the correct number.  The correct # is [phone].

## 2012-01-09 ENCOUNTER — Telehealth: Payer: Self-pay | Admitting: Family Medicine

## 2012-01-09 ENCOUNTER — Encounter: Payer: Medicaid Other | Admitting: Sports Medicine

## 2012-01-09 NOTE — Telephone Encounter (Signed)
Patient is calling saying the # for the [facility] is [phone].  She is very tearful on the phone stating that the pain is getting worse.  I let her know that this # isn't the same # she had given me last week and she began to cry that the pain is so bad, she can't remember all of the #'s of the places she has gone to.  I let her know that I would call the [phone] and [phone] before sending this message through to determine which # is correct.  The correct # for [facility] is [phone].

## 2012-01-09 NOTE — Telephone Encounter (Signed)
Patient is calling asking for this to be added to the previous message.  Patient called back to say that the [phone] was the correct # for [facility].  She also wants to let her doctor know that her throat is also sore all of the time.  She is again very tearful that this pain has lasted for 2 years and is just getting worse.  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

## 2012-01-09 NOTE — Telephone Encounter (Signed)
Pt was a no show again for OMT appointment. Please consider making same day appointments only.

## 2012-01-09 NOTE — Progress Notes (Signed)
This encounter was created in error - please disregard.

## 2012-01-12 NOTE — Telephone Encounter (Signed)
Patient is calling back to check the status of the referral to [facility].

## 2012-01-12 NOTE — Telephone Encounter (Signed)
Called pt. Informed that at the present we are not doing another referral. Spoke with pt for 13 minutes and encouraged her to keep her appt's. She said, that she has so many of them and cannot keep up with them. I advised her to just get a calender and make sure to always write her appt's down. Tomorrow she has an appt with a doctor who will discuss nerve blocks. I told the pt, that it would be maybe better for her to just stay with one doctor and specialist and not just go from one to another and to keep up with doctors plan. Pt agreed and will schedule OV with Dr.Matthews to f/up.  She also reports, that a dog bit her, when she walked in her neighbor hood. Her Td immunization was in 2006. The dog is updated on shots per neighbor. Pt was complaining over and over about all the pain she has. Neck, jaw. But, she will try to keep her appt's. Lorenda Hatchet, Renato Battles

## 2012-01-12 NOTE — Telephone Encounter (Signed)
a 

## 2012-01-13 ENCOUNTER — Telehealth: Payer: Self-pay | Admitting: Family Medicine

## 2012-01-13 NOTE — Telephone Encounter (Signed)
Patient called to schedule F/U with Dr. Ashley Royalty.  His first available isn't until 02/09/12, she has been scheduled and also placed on the Wait List in case of cancellation.  Patient inquired about seeing a different MD including Dr. Berline Chough because she really needs a referral to [facility]".  I reminded her that she spoke to [facility] yesterday afternoon and that she voiced understanding that she would need to keep her appointments here before any referrals would be made and that she is not allowed to see Dr. Berline Chough without first keeping her appointments with her PCP.  Also, I reminded her that per her conversation with [place] yesterday, she voiced understanding that she was going to keep her appointment with the specialist she was scheduled to see today.  She didn't say that she did, and I am pretty sure she didn't.

## 2012-01-13 NOTE — Telephone Encounter (Signed)
Fwd. To PCP for info. See previous message also. Thanks. Lorenda Hatchet, Renato Battles

## 2012-01-17 ENCOUNTER — Telehealth: Payer: Self-pay | Admitting: Family Medicine

## 2012-01-17 NOTE — Telephone Encounter (Signed)
Pt has a question about her periods - coming more frequently- -wanted to speak to nurse about her concern.  Wants to make sure her last pap was normal.

## 2012-01-17 NOTE — Telephone Encounter (Signed)
Called pt. LMVM to call back. Please tell pt:  Last pap was 01/2011 and normal. Does not need another pap until 01/2014. Pt can discuss her periods at her next OV with Dr.Matthews. Thank you. Lorenda Hatchet, Renato Battles

## 2012-02-02 ENCOUNTER — Encounter: Payer: Self-pay | Admitting: Family Medicine

## 2012-02-02 ENCOUNTER — Ambulatory Visit (INDEPENDENT_AMBULATORY_CARE_PROVIDER_SITE_OTHER): Payer: Medicaid Other | Admitting: Family Medicine

## 2012-02-02 VITALS — BP 123/83 | HR 120 | Temp 98.3°F | Ht 65.5 in | Wt 149.0 lb

## 2012-02-02 DIAGNOSIS — N920 Excessive and frequent menstruation with regular cycle: Secondary | ICD-10-CM | POA: Insufficient documentation

## 2012-02-02 DIAGNOSIS — G8929 Other chronic pain: Secondary | ICD-10-CM

## 2012-02-02 LAB — POCT URINE PREGNANCY: Preg Test, Ur: NEGATIVE

## 2012-02-02 LAB — TSH: TSH: 2.518 u[IU]/mL (ref 0.350–4.500)

## 2012-02-02 MED ORDER — NORGESTIMATE-ETH ESTRADIOL 0.25-35 MG-MCG PO TABS: ORAL_TABLET | ORAL | Status: DC

## 2012-02-02 MED ORDER — DICLOFENAC SODIUM 1 % TD GEL: 4.0000 g | Freq: Four times a day (QID) | TRANSDERMAL | Status: DC | PRN

## 2012-02-02 NOTE — Patient Instructions (Addendum)
Diclofenac gel has been sent  For bleeding: -Take birth control pills as directed -Follow-up in 1 week or sooner if bleeding worsens. It should improve next 2-4 days. -If your lab results are normal, I will send you a letter with the results. If abnormal, someone at the clinic will get in touch with you.

## 2012-02-02 NOTE — Assessment & Plan Note (Signed)
Prolonged period x 3 weeks. This has not happened to her before. We will try high dose birth control pills and follow-up in 1 week. Check TSH and urine pregnancy today. She has a history of BTL.

## 2012-02-02 NOTE — Progress Notes (Signed)
  Subjective:    Patient ID: Tasha Fernandez, female    DOB: [birth year], 41 y.o.   MRN: [id number]  HPI She presents for SDA due to vaginal bleeding for the past 3 weeks. She usually has periods once a month. She has a week of spotting and then a regular period.  She last had her usual spotting about 3 weeks ago, then she started this prolonged period. She has been going through 5-6 tampons/pads daily. She has been having trouble affording these products. Her bleeding seemed to improve during these 3 weeks but now she feels like it is worsening.  She denies significant lower abdominal pain or cramping.  She has not had intercourse for the past month.   Review of Systems Denies vaginal irritation or significant abnormal discharge Denies tobacco use   Allergies, medication, past medical history reviewed.  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

## 2012-02-08 ENCOUNTER — Encounter: Payer: Self-pay | Admitting: Family Medicine

## 2012-02-09 ENCOUNTER — Ambulatory Visit (INDEPENDENT_AMBULATORY_CARE_PROVIDER_SITE_OTHER): Payer: Medicaid Other | Admitting: Family Medicine

## 2012-02-09 ENCOUNTER — Encounter: Payer: Self-pay | Admitting: Family Medicine

## 2012-02-09 VITALS — BP 123/87 | HR 102 | Ht 65.5 in | Wt 149.0 lb

## 2012-02-09 DIAGNOSIS — M542 Cervicalgia: Secondary | ICD-10-CM

## 2012-02-09 MED ORDER — PREDNISONE 50 MG PO TABS: 50.0000 mg | ORAL_TABLET | Freq: Every day | ORAL | Status: DC

## 2012-02-09 NOTE — Patient Instructions (Signed)
Thank you for coming in today, it was good to see you I would suggest calling Dr. Rosezella Florida or Dr. Allene Pyo office back to arrange follow up with them.  I am not going to refer you anywhere else right now, it becomes a safety issue when we have multiple specialists doing a lot of the same things Try the steroids to see if this is helpful

## 2012-02-12 NOTE — Progress Notes (Signed)
  Subjective:    Patient ID: Tasha Fernandez, female    DOB: 1970/12/07, 42 y.o.   MRN: [id number]  HPI  1.  Neck Pain:  Here with continued neck and jaw pain.  Thinks this is from a slipped disc in her neck or possibly TMJ.  States that she has daily headache from this.  She is seeing a headache specialist at [facility] tomorrow.  She is once again asking to be referred to the spine center at [facility].  She feels like she needs steroid injections into her neck.  She has been evaluated by neurosurgery, pain management and neurology. She has not followed up regularly with the specialists she has already been referred to.   She endorses mild neck stiffness.  Denies fever, chills, neurological changes, radiation of pain into arms.   Review of Systems Per HPI    Objective:   Physical Exam  Constitutional: She appears well-nourished.       Disheveled appearing.   HENT:  Head: Normocephalic and atraumatic.  Mouth/Throat: Oropharynx is clear and moist.  Neck: Normal range of motion. Neck supple.       Negative spurlings.   Musculoskeletal:       UE strength full and equal bilaterally.           Assessment & Plan:

## 2012-02-12 NOTE — Assessment & Plan Note (Addendum)
Continued complaint of neck and jaw pain, unsure of etiology. Her MRI shows minimal bulge at C4-5 level that I doubt is contributing to the severity of symptoms she is having.  She has been evaluated by multiple specialists.  Review of notes from [facility] through care everywhere shows she never followed up with neurosurgery.  Advised her to call their office to get another appointment with them.  She has been quite non-compliant with therapies including physical therapy, use of soft cervical collar, medications and follow up.  I explained to her that I would not refer her further as it becomes a patient safety issue when multiple specialists are treating for the same condition and prescribing a lot of the same therapies. She should continue to follow up with her current specialists. I have prescribed a short bust of prednisone to see if this could be helpful if there is some inflammatory component.

## 2012-02-14 ENCOUNTER — Encounter: Payer: Self-pay | Admitting: Sports Medicine

## 2012-02-14 ENCOUNTER — Ambulatory Visit (INDEPENDENT_AMBULATORY_CARE_PROVIDER_SITE_OTHER): Payer: Medicaid Other | Admitting: Sports Medicine

## 2012-02-14 VITALS — BP 137/102 | HR 115 | Temp 98.2°F | Wt 142.3 lb

## 2012-02-14 DIAGNOSIS — M542 Cervicalgia: Secondary | ICD-10-CM

## 2012-02-14 DIAGNOSIS — M9981 Other biomechanical lesions of cervical region: Secondary | ICD-10-CM

## 2012-02-14 DIAGNOSIS — Z9114 Patient's other noncompliance with medication regimen: Secondary | ICD-10-CM

## 2012-02-14 DIAGNOSIS — M999 Biomechanical lesion, unspecified: Secondary | ICD-10-CM

## 2012-02-14 DIAGNOSIS — Z9119 Patient's noncompliance with other medical treatment and regimen: Secondary | ICD-10-CM

## 2012-02-14 NOTE — Assessment & Plan Note (Addendum)
OMT performed today. Will refer again to PT  For aggressive therapy.  Given full modalities and manipulation per PT discretion.  May benefit from bio-feedback >>>>I Will not see patient again for OMT if not participating in PT.  Pt voices understanding of this and agreeable to trial of PT although she has been non-compliant with past 3 referrals.

## 2012-02-14 NOTE — Patient Instructions (Addendum)
It was nice to see you today.   Today we discussed: 1. Cervicalgia  Follow up with - Ambulatory referral to Physical Therapy  I would be happy to see you again if you follow up with PT.  If you are unable to go to PT then I will be unable to continue to provide manipulation as it would not be medically indicated.    Please plan to return to see me in 4 weeks.  If you need anything prior to seeing me please talk with Dr. Ashley Royalty.    Please Bring all medications with you to each appointment.

## 2012-02-15 ENCOUNTER — Telehealth: Payer: Self-pay | Admitting: Family Medicine

## 2012-02-15 NOTE — Telephone Encounter (Signed)
Patient called twice this morning.  Initially she tried to say that she didn't know the name of the doctor she saw yesterday Berline Chough).  Then she said she was supposed to have a referral for Physical Therapy but no one has called her yet, then she said she was also supposed to have a referral to pain management.  I knew that the pain management referral was not right so I looked up the notes from her appointment yesterday, and it did not say anything about a pain management referral.  I called back to the [facility] team and spoke to [place] who verified that there is not pain management referral and she double checked with Molly Maduro who confirmed that.  Huntley Dec asked me if I minded giving the patient the phone number to Outpatient Rehab and I agreed.  When I gave that number to the patient, she started asking me questions about what Physical Therapy can do for her, if they would do compressions or prescribe pain medications.  I told her she would have to call them and ask them as were are not Physical Therapy and I am not a clinical person and cannot give her any information.  The call was then ended.  She called back about 20 minutes later and said that there was no answer at Outpatient Rehab and she again started asking me question about what type of services Physical Therapy will be able to offer.  She also mentioned the non-existent referral for pain management.  I told her that there will be no referral for for anything, that per the notes from the doctors she has seen, she will need to complete her Physical Therapy before she can see Dr. Berline Chough or be referred anywhere for anything.  I instructed her to keep calling Outpatient Rehab until she does get an answer.  This is an Financial planner for Dr. Ashley Royalty and Berline Chough.

## 2012-02-15 NOTE — Telephone Encounter (Signed)
Will forward to Dr Matthews 

## 2012-02-17 DIAGNOSIS — Z91199 Patient's noncompliance with other medical treatment and regimen due to unspecified reason: Secondary | ICD-10-CM | POA: Insufficient documentation

## 2012-02-17 DIAGNOSIS — Z9119 Patient's noncompliance with other medical treatment and regimen: Secondary | ICD-10-CM | POA: Insufficient documentation

## 2012-02-17 NOTE — Assessment & Plan Note (Addendum)
This patient's medical literacy is greatly lacking and I truly question her capacity given she has zero recall of prior conversations from prior visits and even demonstrates lack of recall during the same visit.  Her continued preservation on her Jaw Pain without clinical or objective evidence of TMJ dysfunction indicates her pain is more likely psychosomatic in nature.  I recommend that she have formal psychological evaluation including capacity evaluation as I question her ability to comprehend her medical conditions (or lack thereof) and make appropriate decisions on the care for her conditions >I will defer this evaluation/conversation to her PCP Dr. Ashley Royalty.  >I do recommend aggressive PT/rehab for her cervical/thoracic dysfunction that is undoubtedly contributing to some level of pain however without significantly evidence for TMJ dysfunction I cannot explain her preservation on her TMJ pain

## 2012-02-17 NOTE — Assessment & Plan Note (Signed)
Decision today to treat with OMT was based on Physical Exam  After verbal consent patient was treated with HVLA, ME, MFR, CS techniques in Cervical, Thoracic, Ribs, Lumbar, Pelvic/Sacral areas  Patient tolerated the procedure well with improvement in symptoms  Patient given exercises, stretches and lifestyle modifications  See medications in patient instructions if given  Patient will follow up in 4 weeks  If participates in PT

## 2012-02-17 NOTE — Progress Notes (Signed)
  [facility]  Patient name: Tasha Fernandez MRN [id number]  Date of birth: 12-11-1970  CC & HPI:  Tasha Fernandez is a 42 y.o. female presenting today for follow up manipulation for her chronic "jaw pain".  Reports last manipulation was helpful and would like this repeated.   Has not been doing home exercises or made it to her PT appointments.  Unable to recall any information given/discussed at prior visits regarding etiology of her pain inspite of extensive education/time spent during the last 3 visits.  Reports undergoing 2 sphenopalatine nerve blocks without success.  Would like multiple referrals to other neurospecalists/PM&R and anesthesia doctors in spite of multiple conversations with nursing staff coming into and prior to this  appointment that this appointment would be only for manipulation and that any referrals other than PT would need to come from her PCP who is coordinating her care.   ROS:  No changes in vision, persistent headaches.  No locking or popping of her jaw  Pertinent History Reviewed:  Medical & Surgical Hx:  Reviewed: Significant for anxiety, chronic jaw pain, neuropathic pain Medications: Reviewed & Updated - see associated section Social History: Reviewed - Significant for non smoker  Objective Findings:  Vitals:  Filed Vitals:   02/14/12 1507  BP: 137/102  Pulse: 115  Temp: 98.2 F (36.8 C)    PE: GENERAL: Caucasian Adult female. In no discomfort; no respiratory distress. Altruistic.  MSK EXAM: Cervical range of motion Symmetrically restricted in rotation, extension and side bending bilaterally approximately 10-15 in all planes.  Jaw opens with cervical extension. strength is 5+ out of 5 in all of her UE myotomes, no changes in sensation Osteopathic Exam:  CERVICAL AA rotated L C2 F- RS Right. C3 F - RS left  C5 F.- RS Right  THORACIC  T2 rotated left  T4 Rotated R T 6 rotated lef T8 rotated R  RIBS  Posterior R Rib 8    LUMBAR    SACRUM  L on L  sacral torsion   PELVIS  R anterior innominant  LE    UE     .   Assessment & Plan:

## 2012-02-29 ENCOUNTER — Ambulatory Visit: Payer: Medicaid Other | Admitting: Physical Therapy

## 2012-03-01 ENCOUNTER — Ambulatory Visit: Payer: Medicaid Other | Admitting: Physical Therapy

## 2012-03-05 ENCOUNTER — Ambulatory Visit: Payer: Medicaid Other | Attending: Sports Medicine | Admitting: Physical Therapy

## 2012-03-05 DIAGNOSIS — M542 Cervicalgia: Secondary | ICD-10-CM | POA: Insufficient documentation

## 2012-03-05 DIAGNOSIS — M25519 Pain in unspecified shoulder: Secondary | ICD-10-CM | POA: Insufficient documentation

## 2012-03-05 DIAGNOSIS — IMO0001 Reserved for inherently not codable concepts without codable children: Secondary | ICD-10-CM | POA: Insufficient documentation

## 2012-03-08 ENCOUNTER — Ambulatory Visit (INDEPENDENT_AMBULATORY_CARE_PROVIDER_SITE_OTHER): Payer: Medicaid Other | Admitting: Family Medicine

## 2012-03-08 ENCOUNTER — Encounter: Payer: Self-pay | Admitting: Family Medicine

## 2012-03-08 VITALS — BP 133/91 | HR 120 | Ht 65.5 in | Wt 146.0 lb

## 2012-03-08 DIAGNOSIS — G8929 Other chronic pain: Secondary | ICD-10-CM

## 2012-03-08 DIAGNOSIS — R51 Headache: Secondary | ICD-10-CM

## 2012-03-08 DIAGNOSIS — R6884 Jaw pain: Secondary | ICD-10-CM

## 2012-03-08 MED ORDER — PREGABALIN 75 MG PO CAPS: 75.0000 mg | ORAL_CAPSULE | Freq: Two times a day (BID) | ORAL | Status: DC

## 2012-03-08 NOTE — Patient Instructions (Signed)
Thank you for coming in today, it was good to see you You will need to go to an occupational health facility to have your form completed I will not place any further referrals until you have completed physical therapy and are using your medications as directed. Keep your appointments coming up at [facility] with your headache specialist and Dr. Dan Humphreys

## 2012-03-11 NOTE — Progress Notes (Signed)
  Subjective:    Patient ID: Tasha Fernandez, female    DOB: [birth year], 42 y.o.   MRN: [id number]  HPI  1. Jaw/Neck pain:  Here for f/u of chronic jaw/neck pain.  She states that she continues to have daily pain.  It is difficult to ascertain where her pain truly is as she points to different locations each visit and often changes her story during the visit today.  She has not started using lyrica yet because she states she can not afford her copay on this medication.  She does ask if I think a vitamin she saw on television would help her TMJ pain. She has attended one physical therapy session and plans to continue.  She is not using the soft cervical collar prescribed for her.  She has not followed up with Dr. Cherrie Distance at [facility] but she does have an appointment with Dr. Dan Humphreys a neurologist at [facility] who does some type of injections. She also has an appointment with Dr. Leanna Battles who she states is a headache specialist at [facility].    Review of Systems Per HPI    Objective:   Physical Exam  Constitutional: She appears well-nourished. No distress.  HENT:  Head: Normocephalic and atraumatic.       TMJ palpated with no popping, crepitus or tenderness.  Neck: Normal range of motion.  Musculoskeletal:       Negative spurlings.            Assessment & Plan:

## 2012-03-11 NOTE — Assessment & Plan Note (Signed)
As before I am not sure the cause of her current jaw pain. She is being followed by multiple specialists many whom she has not followed up with.  She is also not using lyrica.  Advised that I would not refer her further until she has followed up with current specialists and is compliant with current therapy.

## 2012-03-11 NOTE — Assessment & Plan Note (Signed)
Has an appt. With a headache specialist at [facility] coimng up.

## 2012-03-13 ENCOUNTER — Ambulatory Visit: Payer: Medicaid Other | Attending: Sports Medicine | Admitting: Physical Therapy

## 2012-03-13 DIAGNOSIS — IMO0001 Reserved for inherently not codable concepts without codable children: Secondary | ICD-10-CM | POA: Insufficient documentation

## 2012-03-13 DIAGNOSIS — M542 Cervicalgia: Secondary | ICD-10-CM | POA: Insufficient documentation

## 2012-03-13 DIAGNOSIS — M25519 Pain in unspecified shoulder: Secondary | ICD-10-CM | POA: Insufficient documentation

## 2012-03-14 ENCOUNTER — Telehealth: Payer: Self-pay | Admitting: Family Medicine

## 2012-03-14 NOTE — Telephone Encounter (Signed)
Patient is calling because Transportation needs proof that she did indeed keep her appt on 10/21/11.  They needs something faxed to [phone] stating that she was here on that date.

## 2012-03-14 NOTE — Telephone Encounter (Signed)
Patient is calling back to see if the form had been faxed.  I let her know that she is supposed to bring the slip when she comes to her appt and she said that she must not have had any left when she came that day.  She said that they will take a note.

## 2012-03-15 NOTE — Telephone Encounter (Signed)
Letter faxed to number given.Tasha Fernandez, Tasha Fernandez

## 2012-03-20 ENCOUNTER — Ambulatory Visit: Payer: Medicaid Other | Admitting: Physical Therapy

## 2012-03-28 ENCOUNTER — Ambulatory Visit: Payer: Medicaid Other | Admitting: Physical Therapy

## 2012-03-28 DIAGNOSIS — G501 Atypical facial pain: Secondary | ICD-10-CM | POA: Insufficient documentation

## 2012-03-30 ENCOUNTER — Ambulatory Visit: Payer: Medicaid Other | Admitting: Physical Therapy

## 2012-04-10 ENCOUNTER — Ambulatory Visit: Payer: Medicaid Other | Admitting: Family Medicine

## 2012-04-18 ENCOUNTER — Ambulatory Visit (INDEPENDENT_AMBULATORY_CARE_PROVIDER_SITE_OTHER): Payer: Medicaid Other | Admitting: Family Medicine

## 2012-04-18 ENCOUNTER — Encounter: Payer: Self-pay | Admitting: Family Medicine

## 2012-04-18 VITALS — BP 132/90 | HR 101 | Temp 97.4°F | Ht 65.5 in | Wt 141.1 lb

## 2012-04-18 DIAGNOSIS — R51 Headache: Secondary | ICD-10-CM

## 2012-04-18 DIAGNOSIS — G894 Chronic pain syndrome: Secondary | ICD-10-CM

## 2012-04-18 DIAGNOSIS — M542 Cervicalgia: Secondary | ICD-10-CM

## 2012-04-18 MED ORDER — MELOXICAM 15 MG PO TABS: 15.0000 mg | ORAL_TABLET | Freq: Every day | ORAL | Status: DC

## 2012-04-18 MED ORDER — METHOCARBAMOL 750 MG PO TABS: 750.0000 mg | ORAL_TABLET | Freq: Four times a day (QID) | ORAL | Status: DC | PRN

## 2012-04-18 MED ORDER — LIDOCAINE 5 % EX PTCH: 1.0000 | MEDICATED_PATCH | CUTANEOUS | Status: DC

## 2012-04-18 MED ORDER — PREGABALIN 150 MG PO CAPS: 150.0000 mg | ORAL_CAPSULE | Freq: Two times a day (BID) | ORAL | Status: DC

## 2012-04-18 MED ORDER — KETOROLAC TROMETHAMINE 60 MG/2ML IM SOLN
60.0000 mg | Freq: Once | INTRAMUSCULAR | Status: AC
  Administered 2012-04-18: 60 mg via INTRAMUSCULAR

## 2012-04-18 NOTE — Patient Instructions (Addendum)
Thank you for coming in today, it was good to see you Use the lyrica at the increased dose I have sent in a prescription for robaxin and lidocaine patches Be sure to keep your follow up with your specialists and take medications as directed.

## 2012-04-19 NOTE — Progress Notes (Signed)
  Subjective:    Patient ID: MIALEE WEYMAN, female    DOB: Jun 07, 1970, 42 y.o.   MRN: [id number]  HPI  1. Neck pain/Jaw pain:  Reports that her neck and jaw pain have been worsening over the past few weeks.  Pain is worst in her neck and reports that the pain "moves all around".  She states taht her muscles of her neck are always sore and her pain is worst when walking.  She is still being followed by pain management, neurosurgery, and neurology for these conditions.  She most recently received some type of nerve block from her pain management doctor that she reports has not helped much.  She is not using her lyrica, meloxicam or flexeril.  She uses her soft cervical collar sparingly.  No radicular symptoms.     Review of Systems Per HPI    Objective:   Physical Exam  Constitutional: She appears well-nourished. No distress.  Neck: Normal range of motion.  Neck ttp anywhere I palpate.  Minimal spasm in upper trapezius.   Neurological: She is alert. No cranial nerve deficit.          Assessment & Plan:

## 2012-04-19 NOTE — Assessment & Plan Note (Signed)
No clear explanation for her neck pain.  Reports flexeril was not working well, will have her try robaxin to see if this may work better.  Will also send in rx for lidocaine patches and instructed to restart lyrica.  She continues to request further referral but I explained to her that she should continue to follow up with her current specialists.

## 2012-04-20 ENCOUNTER — Telehealth: Payer: Self-pay | Admitting: *Deleted

## 2012-04-20 NOTE — Telephone Encounter (Signed)
PA required for Lidoderm  Patch. Form placed in Dr. Ashley Royalty' box.

## 2012-04-24 ENCOUNTER — Encounter: Payer: Self-pay | Admitting: *Deleted

## 2012-04-24 NOTE — Telephone Encounter (Signed)
This encounter was created in error - please disregard.

## 2012-04-24 NOTE — Telephone Encounter (Signed)
Online PA received  from [facility] for lidoderm patch. .  Pharmacy notified.   PA # [id number].

## 2012-04-30 ENCOUNTER — Ambulatory Visit (INDEPENDENT_AMBULATORY_CARE_PROVIDER_SITE_OTHER): Payer: Medicaid Other | Admitting: Family Medicine

## 2012-04-30 ENCOUNTER — Encounter: Payer: Self-pay | Admitting: Family Medicine

## 2012-04-30 VITALS — BP 121/74 | HR 105 | Ht 65.5 in | Wt 143.8 lb

## 2012-04-30 DIAGNOSIS — N920 Excessive and frequent menstruation with regular cycle: Secondary | ICD-10-CM | POA: Insufficient documentation

## 2012-04-30 DIAGNOSIS — N921 Excessive and frequent menstruation with irregular cycle: Secondary | ICD-10-CM | POA: Insufficient documentation

## 2012-04-30 MED ORDER — NORGESTIM-ETH ESTRAD TRIPHASIC 0.18/0.215/0.25 MG-35 MCG PO TABS: 1.0000 | ORAL_TABLET | Freq: Every day | ORAL | Status: DC

## 2012-04-30 NOTE — Assessment & Plan Note (Signed)
Irregular and prolonged, does not seem to be overly heavy so will defer hgb at this time since normal 3 months ago.  Start OCPs (pt is a non-smoker)-- will use tricyclic to help control bleeding better (d/w Dr. Jennette Kettle).  Will also get U/S.  If thickened endometrial stripe or does not respond to OCPs, will need endometrial bx given her age of 63.  No other risk factors for endometrial cancer (no obesity, no DM, no unopposed estrogen exposure) so I think it is safe to wait for U/S to make decision about biopsy.

## 2012-04-30 NOTE — Progress Notes (Signed)
S: Pt comes in today for SDA for irregular bleeding.  She reports prolonged bleeding since December (she was seen 12/36/13 for 3 weeks of bleeding).  This time, she has been bleeding for almost 4 weeks- started 04/04/12.  Has been having long periods, which last for 3 weeks, every month since Dec.  Bleeding is every day.  She will change her pad/tampon 5-6 times per day, but they are not soaked through- she just changes them because she goes to the bathroom and there is some blood on them.  She did stop bleeding when she took the OCPs that were Rx'ed in Dec.  She stopped them because she took them all; bleeding restarted when she ran out.  She is a non-smoker.  Of note, she is supposed to be on mobic for chronic pain.  She is a non-smoker.  She is not obese.  She does not have any medical problems outside of chronic pain issues.    ROS: Per HPI  History  Smoking status  . Never Smoker   Smokeless tobacco  . Never Used    O:  Filed Vitals:   04/30/12 1018  BP: 121/74  Pulse: 105    Gen: NAD Abd: soft, NT Pelvic: + both dried brown blood and small amount of bright red blood in vaginal vault and coming from os; able to clear away with 1 fox swab and did not note heavy bleeding from os; no CMT or adnexa fullness; uterus appears normal in size without palpable fibroids; no vaginal lacerations or abnormalities noted   A/P: 42 y.o. female p/w prolonged, irregular bleeding -See problem list -f/u in 1-2 months with PCP

## 2012-04-30 NOTE — Patient Instructions (Addendum)
It was nice to meet you today.  We are going to have you get an ultrasound of your uterus so we can see if anything looks abnormal.  I am also starting you on a birth control pill to help control the bleeding.    If you have swelling in one leg more than the other, sudden onset shortness of breath or sudden onset chest pain that is worse with bleeding, please come to the office or go to the ER.  Do NOT start smoking while on your birth control pill.   Come back to see Dr. Ashley Royalty in 2 months to see if this is helping.

## 2012-05-01 ENCOUNTER — Telehealth: Payer: Self-pay | Admitting: Family Medicine

## 2012-05-01 NOTE — Telephone Encounter (Signed)
Referral to [facility] in [place] denied per Tasha Fernandez.  Patient will need to f/u with her neurologist and/or neurosurgeon.  Tasha Brooks, RN   Returned call to patient and informed of info from Tasha Fernandez.  Patient states she has been seeing Dr. Cherrie Distance (Neurology) at [facility].  "Sees him on a regular basis" and following up with Dr. Cherrie Distance today.  Patient "had to take Medicaid transportation today" and is in [place] now.  Has an appt at 4:30 pm today.  Will page Tasha Fernandez for possible approval and call patient/office back.  Tasha Brooks, RN  Ok to give NPI # per Tasha Fernandez.  Called Dr. Letha Cape office & unable to reach front office.  Patient will have their office contact us for NPI #.  Tasha Brooks, RN

## 2012-05-01 NOTE — Telephone Encounter (Signed)
Pt is at [facility] in [place] and they are needing an NPI # to be able to see her today- it was denied by Desma Mcgregor when she called to the Red team and also Lupita Leash had said she did not have a referral for this visit - she is now upset b/c she has been seeing this doctor on a regular basis and wants to know why she can't see them now. Their phone # is [phone] Need to know asap

## 2012-05-02 NOTE — Telephone Encounter (Signed)
Attempted to call Dr. Letha Cape office yesterday, but did not get option to speak with someone to give NPI #.  Will need correct phone # or have Dr. Letha Cape office call us for NPI #.  Returned call to patient and left message to call our office back.  Gaylene Brooks, RN

## 2012-05-02 NOTE — Telephone Encounter (Signed)
Pt is asking to see if we have called Dr Letha Cape office for her NPI# - is asking to speak with nurse

## 2012-05-03 ENCOUNTER — Ambulatory Visit
Admission: RE | Admit: 2012-05-03 | Discharge: 2012-05-03 | Disposition: A | Discharge status: Home / Self Care | Payer: Medicaid Other | Source: Ambulatory Visit | Attending: Family Medicine | Admitting: Family Medicine

## 2012-05-03 DIAGNOSIS — N921 Excessive and frequent menstruation with irregular cycle: Secondary | ICD-10-CM

## 2012-05-03 DIAGNOSIS — N920 Excessive and frequent menstruation with regular cycle: Secondary | ICD-10-CM

## 2012-05-07 NOTE — Telephone Encounter (Signed)
Called Dr. Letha Cape office [phone] and left message to call our office back in order for [place] to provide NPI # to authorize last week's visit.  Gaylene Brooks, RN

## 2012-05-07 NOTE — Telephone Encounter (Signed)
Received call from [facility] and Dr. Letha Cape office and provided NPI #.  Also, called and informed patient that NPI # was given.  Gaylene Brooks, RN\

## 2012-05-08 ENCOUNTER — Telehealth: Payer: Self-pay | Admitting: Family Medicine

## 2012-05-08 NOTE — Telephone Encounter (Signed)
Pt is asking to speak to nurse about getting another referral to Dr Cherrie Distance office so he can do another injection in her jaw

## 2012-05-09 NOTE — Telephone Encounter (Signed)
Forward to PCP, are we doing referral for this patient?

## 2012-05-09 NOTE — Telephone Encounter (Signed)
She is already seeing Dr Cherrie Distance.  If they need another referral we can do one, but she needs to have them call our office.

## 2012-05-10 ENCOUNTER — Telehealth: Payer: Self-pay | Admitting: Family Medicine

## 2012-05-10 NOTE — Telephone Encounter (Signed)
Patient is calling to find out if the referral for the neurologist is going to be approved.  She has a bit of an attitude and is very demanding that this happen as soon as possible.  Her appt is on 4/9 and she has another appt on 4/10 which is the one she is concerned about.

## 2012-05-10 NOTE — Telephone Encounter (Signed)
Called and spoke with patient, the appointment has been approved for Dr Cherrie Distance. She is requesting that we put in a referral for an appointment that she already has on 4/10/  with Dr Leanna Battles at [facility]. She said she was referred by her neurologist, so I told her we can not approve a referral we did not create. She is upset and is demanding Dr Ashley Royalty put in the referral to Dr Leanna Battles. I told her I would have to discuss this with him and let her know, but at this point we cannot do any new referrals for her. Will forward to PCP and [facility].Aamari Strawderman, Rodena Medin

## 2012-05-10 NOTE — Telephone Encounter (Signed)
Patient asked to speak to [facility] to call her as well.

## 2012-05-14 ENCOUNTER — Telehealth: Payer: Self-pay | Admitting: Family Medicine

## 2012-05-14 NOTE — Telephone Encounter (Signed)
Asking to speak with nurse about her referral on the 10th [facility] headache

## 2012-05-14 NOTE — Telephone Encounter (Signed)
Forward to PCP, patient wants a referral approved that her neurologist put in for her??

## 2012-05-14 NOTE — Telephone Encounter (Signed)
Left message for patient to return call. Please tell her she needs to have the doctor's office that put in the referral call us in order to have it approved.Nikira Kushnir, Rodena Medin

## 2012-05-14 NOTE — Telephone Encounter (Signed)
Called and left message with Dr. Letha Cape office.  Need more info regarding recent referral to Dr. Posey Boyer office.  Will await callback.  Gaylene Brooks, RN

## 2012-05-14 NOTE — Telephone Encounter (Signed)
Patient is calling back after calling on 4/3 of which no one called her back and that encounter has been closed.  She wants to know if her appt to the headache doctor is going to be approved.

## 2012-05-15 NOTE — Telephone Encounter (Addendum)
Spoke with Sunny Schlein at Dr. Letha Cape office.  Patient has an appt tomorrow for an injection at 3:45 pm.  NPI # given to authorize tomorrow's appt.  Patient was also seen by Dr. Leanna Battles (Neuro) on 04/15/12 and was "initially referred by [facility] in January 2014."  Patient has an appt for "2nd opinion" with Dennard Nip, MD (Neuro)---also in with [facility].  Dr. Ashley Royalty informed.  Gaylene Brooks, RN

## 2012-05-15 NOTE — Telephone Encounter (Signed)
I think that is fine if neurologist placed the referral and feels that she needs it.

## 2012-05-15 NOTE — Telephone Encounter (Signed)
See phone note from 05/08/12.  Spoke with Sunny Schlein at Dr. Letha Cape office. Patient has an appt tomorrow for an injection at 3:45 pm. NPI # given to authorize tomorrow's appt. Patient was also seen by Dr. Leanna Battles (Neuro) on 04/15/12 and was "initially referred by [facility] in January 2014." Patient has an appt for "2nd opinion" with Dennard Nip, MD (Neuro)---also in with [facility]. Dr. Ashley Royalty informed.  Gaylene Brooks, RN

## 2012-05-28 ENCOUNTER — Telehealth: Payer: Self-pay | Admitting: Family Medicine

## 2012-05-28 NOTE — Telephone Encounter (Signed)
Will fwd. To PCP for review .Akane Tessier  

## 2012-05-28 NOTE — Telephone Encounter (Signed)
Patient is calling to find out if the headache specialist has sent information to Dr. Ashley Royalty about how she was going to treat and who she thought Ms. Aggarwal needs to be referred out to.

## 2012-05-30 ENCOUNTER — Encounter: Payer: Self-pay | Admitting: Family Medicine

## 2012-05-30 ENCOUNTER — Telehealth: Payer: Self-pay | Admitting: Family Medicine

## 2012-05-30 NOTE — Telephone Encounter (Signed)
Spoke w/pt and made sure she had # for [facility] so when she is ready she can sched a visit w/a psychiatrist. Pt stated she had the #, also had other ?s re: her pain and I advised her she can discuss further w/Dr Ashley Royalty at her next appt 4/28. Pt agreed.

## 2012-05-30 NOTE — Telephone Encounter (Signed)
Recommendations per Neurologist at [facility]  1. Highly recommend stress management and relaxation techniques, I suspect an underlying psychiatric diagnosis.  2. Continue pain management per Dr.Kapural 3. Patient has agreed to see a psychiatrist in the future, she does not want the referral placed today and she will consider this for the future. She states she will discuss this with her PCP.  4. Would not recommend medication changes currently, however patient does not seem to benefit from this medication. 5. I do not have a specific medication that I would recommend at this time.   Can contact [facility] if she would like to be seen by psychiatrist:  [phone]

## 2012-06-04 ENCOUNTER — Ambulatory Visit: Payer: Medicaid Other | Admitting: Family Medicine

## 2012-06-07 NOTE — Telephone Encounter (Signed)
Yes, please see phone note from 4/23

## 2012-06-07 NOTE — Telephone Encounter (Signed)
Has this been addressed yet?

## 2012-06-08 ENCOUNTER — Encounter: Payer: Self-pay | Admitting: Family Medicine

## 2012-06-08 ENCOUNTER — Ambulatory Visit (INDEPENDENT_AMBULATORY_CARE_PROVIDER_SITE_OTHER): Payer: Medicaid Other | Admitting: Family Medicine

## 2012-06-08 VITALS — BP 124/82 | HR 118 | Ht 65.5 in | Wt 144.0 lb

## 2012-06-08 DIAGNOSIS — G8929 Other chronic pain: Secondary | ICD-10-CM

## 2012-06-08 DIAGNOSIS — G894 Chronic pain syndrome: Secondary | ICD-10-CM

## 2012-06-08 DIAGNOSIS — R6884 Jaw pain: Secondary | ICD-10-CM

## 2012-06-08 NOTE — Assessment & Plan Note (Signed)
As noted before has seen multiple specialists with no clear etiology.  Recommendations from neurologist suggest rheumatology referral for eval for Fibromyalgia, which I am willing to do.  I also explained to her that certain mood disorders can make chronic pain worse and that she should also be evaluated by psychiatry, given number to [facility].  Discussed importance of medication compliance for it to be helpful.

## 2012-06-08 NOTE — Patient Instructions (Addendum)
Thank you for coming in today, it was good to see you I will place a referral to rheumatology at [facility] Be sure you are taking the medication that has been prescribed to you.  You should call [facility] to set up an appointment the number is [phone]

## 2012-06-08 NOTE — Progress Notes (Signed)
  Subjective:    Patient ID: NARGIS ABRAMS, female    DOB: July 22, 1970, 42 y.o.   MRN: [id number]  HPI  1. Neck/Jaw pain:  Here for f/u of chronic neck and jaw pain.  Since her last visit with me she has seen her pain management specialist and received another injection, she reports was not helpful.  She has also seen a neurologist that specializes in headaches.  She did not seem to have anything additional to offer her but suggested she be seen by a rheumatologist to evaluate for fibromyalgia and by psychiatry to rule out any psychiatric disorder that may be contributing.  Today she continues to complain of neck, throat and jaw pain continues to talk about multiple vitamins and supplements that she has read about on the internet and wants to try.  Her pain is unchanged from previous visits.  She is not compliant with her prescribed medications.    Review of Systems Per HPI    Objective:   Physical Exam  Constitutional: She appears well-nourished. No distress.  HENT:  Head: Normocephalic and atraumatic.  Mouth/Throat: Oropharynx is clear and moist. No oropharyngeal exudate.  Neck: Normal range of motion. Neck supple. No thyromegaly present.  Musculoskeletal:  No spasm or bony tenderness palpated along neck.  TMJ with no popping or crepitus.    Lymphadenopathy:    She has no cervical adenopathy.          Assessment & Plan:

## 2012-06-14 ENCOUNTER — Other Ambulatory Visit: Payer: Self-pay | Admitting: Sports Medicine

## 2012-06-22 ENCOUNTER — Telehealth: Payer: Self-pay | Admitting: *Deleted

## 2012-06-22 MED ORDER — CYCLOBENZAPRINE HCL 10 MG PO TABS: 10.0000 mg | ORAL_TABLET | Freq: Three times a day (TID) | ORAL | Status: DC | PRN

## 2012-06-22 NOTE — Telephone Encounter (Signed)
Will refill x1, but needs to be seen for further refills.

## 2012-06-22 NOTE — Telephone Encounter (Signed)
Attempted to call pt regarding refills no answer available. Wyatt Haste, RN-BSN

## 2012-06-22 NOTE — Telephone Encounter (Signed)
Pt is requesting Flexeril 10 mg tab (quantity 90) refill - last filled on 05/14/2012 written by Dr. Denyse Amass. Please advise. Wyatt Haste, RN-BSN

## 2012-06-25 ENCOUNTER — Ambulatory Visit: Payer: Medicaid Other | Admitting: Family Medicine

## 2012-06-28 ENCOUNTER — Encounter: Payer: Self-pay | Admitting: Family Medicine

## 2012-06-28 ENCOUNTER — Ambulatory Visit (INDEPENDENT_AMBULATORY_CARE_PROVIDER_SITE_OTHER): Payer: Medicaid Other | Admitting: Family Medicine

## 2012-06-28 VITALS — BP 116/81 | HR 108 | Ht 65.5 in | Wt 143.0 lb

## 2012-06-28 DIAGNOSIS — Z791 Long term (current) use of non-steroidal anti-inflammatories (NSAID): Secondary | ICD-10-CM

## 2012-06-28 DIAGNOSIS — R6884 Jaw pain: Secondary | ICD-10-CM

## 2012-06-28 DIAGNOSIS — G8929 Other chronic pain: Secondary | ICD-10-CM

## 2012-06-28 LAB — COMPREHENSIVE METABOLIC PANEL WITH GFR
ALT: 16 U/L (ref 0–35)
AST: 18 U/L (ref 0–37)
Albumin: 4 g/dL (ref 3.5–5.2)
Alkaline Phosphatase: 54 U/L (ref 39–117)
BUN: 15 mg/dL (ref 6–23)
CO2: 27 meq/L (ref 19–32)
Calcium: 9.1 mg/dL (ref 8.4–10.5)
Chloride: 105 meq/L (ref 96–112)
Creat: 0.93 mg/dL (ref 0.50–1.10)
Glucose, Bld: 70 mg/dL (ref 70–99)
Potassium: 5.2 meq/L (ref 3.5–5.3)
Sodium: 137 meq/L (ref 135–145)
Total Bilirubin: 0.3 mg/dL (ref 0.3–1.2)
Total Protein: 7 g/dL (ref 6.0–8.3)

## 2012-06-28 MED ORDER — AMITRIPTYLINE HCL 25 MG PO TABS: 25.0000 mg | ORAL_TABLET | Freq: Every day | ORAL | Status: DC

## 2012-06-28 NOTE — Assessment & Plan Note (Signed)
Unclear etiology for her neck and jaw pain.  As previously noted she is followed by multiple specialists at [facility] hospital.  She has history of being poorly compliant with follow up and treatment reigmen.  She has an upcoming appt. With rheumatology.  Her rheumatological markers have been negative in the past.  Discussed having her make appt with psychiatry and keeping appt with specialists.  I will start amitriptyline to see if this may be helpful for her.

## 2012-06-28 NOTE — Patient Instructions (Signed)
Thank you for coming in today, it was good to see you Try the amitriptyline for your pain Follow up with me in 3-4 weeks Discuss injections with Dr. Cherrie Distance Avoid using aleve, motrin, goody powders while taking voltaren.

## 2012-06-28 NOTE — Progress Notes (Signed)
  Subjective:    Patient ID: Tasha Fernandez, female    DOB: [birth year], 42 y.o.   MRN: [id number]  HPI  1. Chronic Pain:   F/u on chronic pain.  Pain continues in neck and jaw.  She was referred to rheumatology at her last appointment and has an appt on 6/12.  She also has follow up with her pain management doctor on 5/28.  Since the last time I saw her she has had no changes in pain.  She has not been compliant with lyrica or other treatment regimens.  She is using goody powders, aleve, ibuprofen and voltaren several times per day.  She denies any GI upset or bloody stools.  She has not scheduled with psychiatry yet.  She would like me to start her on humira because she read on the internet that she should use this since she believes she has RA.  She has not had any peripheral joint swelling, fevers or rash  Review of Systems Per HPI    Objective:   Physical Exam  Constitutional:  Multiple adhesive salon pas patches on her face and neck Her speech is pressured and she is difficult to keep on track   HENT:  Head: Normocephalic and atraumatic.  Neck: Normal range of motion. Neck supple.          Assessment & Plan:

## 2012-07-04 ENCOUNTER — Ambulatory Visit: Payer: Medicaid Other | Admitting: Sports Medicine

## 2012-07-06 ENCOUNTER — Telehealth: Payer: Self-pay | Admitting: Family Medicine

## 2012-07-06 NOTE — Telephone Encounter (Signed)
Called pt back and she has used both and robaxin with no relief.  Maedell Hedger, Darlyne Russian, CMA

## 2012-07-06 NOTE — Telephone Encounter (Signed)
Will fwd to MD.  Malisha Mabey L, CMA  

## 2012-07-06 NOTE — Telephone Encounter (Signed)
Should have meloxicam and flexeril to use.

## 2012-07-06 NOTE — Telephone Encounter (Signed)
No narcotic pain medications will be prescribed without an office visit.

## 2012-07-06 NOTE — Telephone Encounter (Signed)
Spoke with patient and her headaches are really bothering her right now.  Meds not helping. Would like something to stop the headaches for a few days.  Told patient I would relay msg to MD.  Radene Ou, CMA

## 2012-07-06 NOTE — Telephone Encounter (Signed)
Patient is calling asking for something for pain to relieve headaches and neck pain to be sent to [facility] on [address].

## 2012-07-11 ENCOUNTER — Telehealth: Payer: Self-pay | Admitting: Family Medicine

## 2012-07-11 DIAGNOSIS — M542 Cervicalgia: Secondary | ICD-10-CM

## 2012-07-11 DIAGNOSIS — R6884 Jaw pain: Secondary | ICD-10-CM

## 2012-07-11 NOTE — Telephone Encounter (Signed)
Will fwd to MD for review.  Jaivon Vanbeek L, CMA  

## 2012-07-11 NOTE — Telephone Encounter (Signed)
Pt says dr Vicenta Aly at [facility] wants her to be referred to neurologist at [facility] for nerve testing Please advise

## 2012-07-11 NOTE — Telephone Encounter (Signed)
Received call from [facility] at [facility] asking to have patient referred to Neurologist for an EMG.  Sunny Schlein can be reached at [phone].  Will forward to Dr. Ashley Royalty for review.  Ileana Ladd

## 2012-07-11 NOTE — Telephone Encounter (Signed)
That is fine if they feel that is what she needs. I will place referral.

## 2012-07-12 NOTE — Addendum Note (Signed)
Addended by: Damita Lack on: 07/12/2012 09:24 AM   Modules accepted: Orders

## 2012-07-17 ENCOUNTER — Encounter: Payer: Self-pay | Admitting: Sports Medicine

## 2012-07-17 ENCOUNTER — Ambulatory Visit (INDEPENDENT_AMBULATORY_CARE_PROVIDER_SITE_OTHER): Payer: Medicaid Other | Admitting: Sports Medicine

## 2012-07-17 ENCOUNTER — Other Ambulatory Visit: Payer: Self-pay | Admitting: Family Medicine

## 2012-07-17 VITALS — BP 122/87 | HR 105 | Temp 98.2°F | Ht 65.5 in | Wt 139.8 lb

## 2012-07-17 DIAGNOSIS — M542 Cervicalgia: Secondary | ICD-10-CM

## 2012-07-17 DIAGNOSIS — G4440 Drug-induced headache, not elsewhere classified, not intractable: Secondary | ICD-10-CM

## 2012-07-17 DIAGNOSIS — M9981 Other biomechanical lesions of cervical region: Secondary | ICD-10-CM

## 2012-07-17 DIAGNOSIS — M999 Biomechanical lesion, unspecified: Secondary | ICD-10-CM
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
## 2012-07-17 NOTE — Patient Instructions (Addendum)
It was nice to see you today.   Today we discussed: 1. Somatic Dysfunction (Osteopathic Findings) Remember to stretch using the towel.  I would like for you to work exclusively on looking up exercises.  You do not need to be stretching out the back of your neck by looking down.  These continue to followup with your specialist.  If this helps please return in one month   Please plan to return to see me in 4 weeks.  If you need anything prior to seeing me please call the clinic.  Please Bring all medications with you to each appointment.

## 2012-07-17 NOTE — Progress Notes (Signed)
  [facility]  Patient name: Tasha Fernandez MRN [id number]  Date of birth: 1970/10/29  CC & HPI:  Tasha Fernandez is a 42 y.o. female presenting today for Osteopathic Manipulation:  Pt reports pain in: Location  bilateral neck, while a jaw, right shoulder   Onset  greater than 2 years   Character  sharp cramping burning that is unchanged in the   Severity  "very painful"    Temporal  all the time worse with exercise  Alleviating  nothing and she has tried multiple things including multiple pain   Aggrivating  anything    Other Issues to discuss today: # Perseverative on her thyroid function, seeing other specialist.  ROS:  Report some occasional shooting pain in her arm but then will localized to the elbow in the wrist and denies any overt radiculopathy symptoms when directly asked.  Denies any specific weakness, paralysis or change in sensation.  Pertinent History Reviewed:  Medical & Surgical Hx:  Reviewed: Significant for anxiety, GERD, chronic headache, chronic jaw pain he was seen multiple specialist Medications: Reviewed & Updated - see associated section Social History: Reviewed -  reports that she has never smoked. She has never used smokeless tobacco.  Objective Findings:  Vitals: BP 122/87  Pulse 105  Temp(Src) 98.2 F (36.8 C) (Oral)  Ht 5' 5.5" (1.664 m)  Wt 139 lb 12.8 oz (63.413 kg)  BMI 22.9 kg/m2  PE: GENERAL:  Adult disheveled thin, female. In no discomfort; no respiratory distress. PSYCH: Bizarre affect, tangential and perseverative on subjective interest her MSK/Osteopathic: Negative Spurling's compression tests, negative vertebral artery insufficiency testing NEURO EXAM:  UE: Strength is 5+ out of 5 bilaterally, sensation is grossly intact, and DTRs are 1+ out of 4   Findings:  Diagnoses: CERVICAL   C2 flexed rotated to left   C4 flexed rotated to right  C6 flexed rotated left   THORACIC   T2 flexed rotated left   RIBS    posterior rib 4 on right   Cranial    SBS compression, CRI elevated at approximately 20    Assessment & Plan:

## 2012-07-17 NOTE — Assessment & Plan Note (Signed)
Decision today to treat with OMT was based on Physical Exam.  Verbal consent was obtained after explanation of risks, benefits and potential side-effects including acute pain flare, post-manipulation soreness, and need for repeat treatments.    The Patient was treated with Cranial, MFR, ME, CS & HVLA techniques in Cranial, Cervical, Thoracic, Rib areas  Patient tolerated the procedure well with improvement in symptoms.  Patient given medications, exercises, stretches and lifestyle modifications per AVS and verbally.    Patient will follow up in 4 weeks

## 2012-07-23 ENCOUNTER — Emergency Department (HOSPITAL_COMMUNITY)
Admission: EM | Admit: 2012-07-23 | Discharge: 2012-07-23 | Disposition: A | Discharge status: Home / Self Care | Payer: Medicaid Other | Attending: Emergency Medicine | Admitting: Emergency Medicine

## 2012-07-23 ENCOUNTER — Encounter (HOSPITAL_COMMUNITY): Payer: Self-pay

## 2012-07-23 DIAGNOSIS — R51 Headache: Secondary | ICD-10-CM | POA: Insufficient documentation

## 2012-07-23 DIAGNOSIS — M542 Cervicalgia: Secondary | ICD-10-CM | POA: Insufficient documentation

## 2012-07-23 DIAGNOSIS — R52 Pain, unspecified: Secondary | ICD-10-CM | POA: Insufficient documentation

## 2012-07-23 DIAGNOSIS — F411 Generalized anxiety disorder: Secondary | ICD-10-CM | POA: Insufficient documentation

## 2012-07-23 DIAGNOSIS — H5710 Ocular pain, unspecified eye: Secondary | ICD-10-CM | POA: Insufficient documentation

## 2012-07-23 DIAGNOSIS — J3489 Other specified disorders of nose and nasal sinuses: Secondary | ICD-10-CM | POA: Insufficient documentation

## 2012-07-23 DIAGNOSIS — Z8719 Personal history of other diseases of the digestive system: Secondary | ICD-10-CM | POA: Insufficient documentation

## 2012-07-23 DIAGNOSIS — H9209 Otalgia, unspecified ear: Secondary | ICD-10-CM | POA: Insufficient documentation

## 2012-07-23 DIAGNOSIS — G479 Sleep disorder, unspecified: Secondary | ICD-10-CM | POA: Insufficient documentation

## 2012-07-23 DIAGNOSIS — J029 Acute pharyngitis, unspecified: Secondary | ICD-10-CM | POA: Insufficient documentation

## 2012-07-23 MED ORDER — KETOROLAC TROMETHAMINE 60 MG/2ML IM SOLN
30.0000 mg | Freq: Once | INTRAMUSCULAR | Status: AC
  Administered 2012-07-23: 30 mg via INTRAMUSCULAR
  Filled 2012-07-23: qty 2

## 2012-07-23 MED ORDER — ONDANSETRON 4 MG PO TBDP
4.0000 mg | ORAL_TABLET | Freq: Once | ORAL | Status: AC
  Administered 2012-07-23: 4 mg via ORAL
  Filled 2012-07-23: qty 1

## 2012-07-23 NOTE — ED Provider Notes (Signed)
History     CSN: [id number]  Arrival date & time 07/23/12  1254   First MD Initiated Contact with Patient 07/23/12 1336      Chief Complaint  Patient presents with  . Headache  . Generalized Body Aches    (Consider location/radiation/quality/duration/timing/severity/associated sxs/prior treatment) HPI  42 year old female with history of TMJ, anxiety, presents complaining of headache and body aches. Patient states she has had persistent headache, jaw pain, joint pain ongoing for 3 years. Pain is severe, causing her difficulty sleeping, while performing daily activities. Nothing seems to make pain worse or better. She has been seen by multiple specialists including pain management specialist, neurologist, ENT, and has had multiple treatments including nerve block, steroid block, and pain medication without relief. Her pain is getting progressively worse prompting her to come to the ED today. Patient states last specialist felt she may have fibromyalgia. Patient currently denies fever, double vision, sneezing, coughing, chest pain, shortness of breath, abdominal pain, vomiting or diarrhea. She does endorse nausea. She denies any rash. Denies any numbness or weakness. She has been taking Excedrin Migraine headache, Goody headache powder without relief.    Past Medical History  Diagnosis Date  . Seasonal allergies   . Anxiety   . TMJ (dislocation of temporomandibular joint)     Past Surgical History  Procedure Laterality Date  . Cholecystectomy    . Cesarean section    . Tubal ligation      No family history on file.  History  Substance Use Topics  . Smoking status: Never Smoker   . Smokeless tobacco: Never Used  . Alcohol Use: Yes     Comment: social    OB History   Grav Para Term Preterm Abortions TAB SAB Ect Mult Living                  Review of Systems  Constitutional: Negative for fever and chills.  HENT: Positive for ear pain, sore throat, neck pain and sinus  pressure. Negative for facial swelling, trouble swallowing, voice change, tinnitus and ear discharge.   Eyes: Positive for pain.  Respiratory: Negative for shortness of breath.   Cardiovascular: Negative for chest pain.  Gastrointestinal: Negative for abdominal pain.  Musculoskeletal: Negative for gait problem.  Skin: Negative for rash and wound.  Neurological: Positive for headaches. Negative for speech difficulty and numbness.    Allergies  Review of patient's allergies indicates no known allergies.  Home Medications   Current Outpatient Rx  Name  Route  Sig  Dispense  Refill  . amitriptyline (ELAVIL) 25 MG tablet   Oral   Take 1 tablet (25 mg total) by mouth at bedtime. For one week then increase to 50mg  at bedtime   60 tablet   1   . aspirin-acetaminophen-caffeine (EXCEDRIN MIGRAINE) 250-250-65 MG per tablet   Oral   Take 1 tablet by mouth every 6 (six) hours as needed for pain.         . Aspirin-Acetaminophen-Caffeine (GOODY HEADACHE PO)   Oral   Take 1 packet by mouth every 6 (six) hours as needed (for pain).         . cyclobenzaprine (FLEXERIL) 10 MG tablet   Oral   Take 10 mg by mouth 3 (three) times daily as needed for muscle spasms.         . diclofenac sodium (VOLTAREN) 1 % GEL   Topical   Apply 4 g topically 4 (four) times daily as needed.   1  Tube   1   . naproxen sodium (ANAPROX) 220 MG tablet   Oral   Take 220 mg by mouth 2 (two) times daily as needed.         . pregabalin (LYRICA) 150 MG capsule   Oral   Take 1 capsule (150 mg total) by mouth 2 (two) times daily.   60 capsule   3     BP 133/87  Pulse 105  Temp(Src) 98 F (36.7 C) (Oral)  Resp 12  Ht 5\' 5"  (1.651 m)  Wt 139 lb (63.05 kg)  BMI 23.13 kg/m2  SpO2 100%  LMP 06/20/2012  Physical Exam  Nursing note and vitals reviewed. Constitutional: She is oriented to person, place, and time. She appears well-developed and well-nourished.  Pt is awake, alert, tearful, but nontoxic  in appearance  HENT:  Head: Normocephalic and atraumatic.  Right Ear: External ear normal.  Left Ear: External ear normal.  Nose: Nose normal.  Mouth/Throat: Oropharynx is clear and moist. No oropharyngeal exudate.  Tenderness to base of jaw bilaterally without overlying skin changes.  Normal jaw movement, no trismus.    Eyes: Conjunctivae and EOM are normal. Pupils are equal, round, and reactive to light.  Neck: Normal range of motion. Neck supple. No JVD present. No tracheal deviation present. No thyromegaly present.  No nuchal rigidity  Cardiovascular: Normal rate and regular rhythm.   Pulmonary/Chest: Effort normal and breath sounds normal.  Abdominal: Soft. There is no tenderness.  Musculoskeletal: She exhibits no edema.  Lymphadenopathy:    She has no cervical adenopathy.  Neurological: She is alert and oriented to person, place, and time.  Skin: No rash noted.  Psychiatric: She has a normal mood and affect.    ED Course  Procedures (including critical care time)  2:14 PM Pt presents with chronic headache and jaw pain, ongoing x 3 years.  Has had multiple treatments by multiple specialists without specific diagnosis.  I do not think we can identify specific etiology of her pain.  However, no life threatening emergency at this time.  She is afebrile, VSS.  No airway compromise.  I will provide pain management here in ER, but pt will benefit from f/u with her neurologist and PCP for further care.  Pt agrees with plan.    Labs Reviewed - No data to display No results found.   1. Headache       MDM  BP 133/87  Pulse 105  Temp(Src) 98 F (36.7 C) (Oral)  Resp 12  Ht 5\' 5"  (1.651 m)  Wt 139 lb (63.05 kg)  BMI 23.13 kg/m2  SpO2 100%  LMP 06/20/2012         Fayrene Helper, PA-C 07/23/12 1520

## 2012-07-23 NOTE — ED Provider Notes (Signed)
Medical screening examination/treatment/procedure(s) were performed by non-physician practitioner and as supervising physician I was immediately available for consultation/collaboration.    Celene Kras, MD 07/23/12 [phone]

## 2012-07-23 NOTE — ED Notes (Signed)
Pt presents with c/o headache and generalized body aches. Pt says these symptoms have been going on for about three years. Pt says she feels worse today and the symptoms will not go away. Pt says she always has a sore throat and facial pain. Pt says she has been to see a specialist and there is a possibility that she may have fibromyalgia.

## 2012-07-27 ENCOUNTER — Encounter: Payer: Self-pay | Admitting: Family Medicine

## 2012-07-27 ENCOUNTER — Ambulatory Visit: Payer: Medicaid Other | Admitting: Family Medicine

## 2012-08-08 ENCOUNTER — Ambulatory Visit (INDEPENDENT_AMBULATORY_CARE_PROVIDER_SITE_OTHER): Payer: Medicaid Other | Admitting: Family Medicine

## 2012-08-08 ENCOUNTER — Encounter: Payer: Self-pay | Admitting: Family Medicine

## 2012-08-08 VITALS — BP 121/85 | HR 123 | Ht 65.0 in | Wt 140.0 lb

## 2012-08-08 DIAGNOSIS — R000 Tachycardia, unspecified: Secondary | ICD-10-CM

## 2012-08-08 DIAGNOSIS — R6884 Jaw pain: Secondary | ICD-10-CM

## 2012-08-08 DIAGNOSIS — G8929 Other chronic pain: Secondary | ICD-10-CM

## 2012-08-08 MED ORDER — CYCLOBENZAPRINE HCL 10 MG PO TABS: 10.0000 mg | ORAL_TABLET | Freq: Three times a day (TID) | ORAL | Status: DC | PRN

## 2012-08-08 NOTE — Assessment & Plan Note (Addendum)
This is my first time meeting Tasha Fernandez. She has seen Dr. Ashley Royalty, her previous PCP, multiple times for this pain and has had multiple evaluations with specialists, all without clear explanations. She now requests referral to two additional specialists. Dr. Ashley Royalty clearly stated in his documentation that he would not be referring her further for this problem. She is very talkative and difficult to get a thorough history from. My exam finds no acute abnormalities with her neck/jaw.  I have asked her to keep her upcoming appointment with the biofeedback provider next week, and then to follow up with her pain management physician. I feel that pain management doctors should be prescribing her pain medications if she is following with them regularly, to prevent "double dipping". I explained this to pt today. I will refill her flexeril today as she says she is out of it. I explained that I won't be referring her to ENT or neurosurgery unless her pain doctor feels that she needs additional specialist evaluation. Precepted with Dr. Tyler Aas, who agrees with this plan. Will have pt f/u in 1 month to see how things are going with her chronic pain.

## 2012-08-08 NOTE — Patient Instructions (Addendum)
It was nice to meet you today!  I don't think you need to be referred to another doctor today. Please keep seeing your pain management doctor and the psychologist for biofeedback.   Check your heart rate at home and if you are consistently getting over 100 please call me because we might need to add a medicine for your heart. If you have any chest pain or shortness of breath, come back to the clinic or go to the emergency room.  Be well, Dr. Pollie Meyer

## 2012-08-08 NOTE — Assessment & Plan Note (Addendum)
Noted on vitals today. From review of previous records, it appears pt has been fairly tachycardic at most of her visits, with the highest in the 120s. Does not seem to have ever had any cardiac workup, including an EKG. Pulse is regular today, so I am not concerned about atrial fibrillation. I suspect this is largely anxiety/white coat tachycardia given her general presentation and likely underlying psychiatric disease for which Dr. Ashley Royalty has tried to get pt to seek treatment in the past. I have asked pt to check her pulse several times a week at home and write down the numbers for [place]. If she consistently gets > 100, it is likely worth pursuing additional workup and/or adding a beta blocker to control her heart rate. Precepted with Dr. Mahala Menghini who agrees with this plan. Will have pt f/u in 1 month.

## 2012-08-08 NOTE — Progress Notes (Signed)
Patient ID: Tasha Fernandez, female   DOB: December 01, 1970, 42 y.o.   MRN: [id number]  HPI:  TMJ: Was originally diagnosed with TMJ by an ENT several years ago. Has associated shouder pain, neck pain, jaw pain, and pain that radiates burning down arm.  This has gone on for over 3 years. Has seen Dr. Cherrie Distance (pain management) for this but says he is not treating her right now. Doing magnet therapy. Won't be able to see pain management until sees another doctor (psychologist/pain doctor) for biofeedback, has appointment for that next week.  Nothing seems to make the pain go away. Has tried stretching, exercises, magnet therapy, etc but nothing seems to help. Saw neurosurgeon in [place] 1.5 years ago. Has had multiple injections before. Has two different doctors she would like a referral to, one is an ENT (Dr. Alinda Dooms) and another a neurosurgeon ([facility]).   Tachycardia: Noted on vital signs today. Pt says she does notice that her heart beats fast sometimes. Has occasional chest pain that is like a heartburn type pain. Not worse with exertion. Neck pain is worse with walking. Only gets short of breath around cigarette smoke, otherwise no SOB.  ROS: See HPI  PMFSH: hx chronic jaw pain which per records review, has been extensively worked up in the past.  PHYSICAL EXAM: BP 121/85  Pulse 123  Ht 5\' 5"  (1.651 m)  Wt 140 lb (63.504 kg)  BMI 23.3 kg/m2  LMP 06/20/2012 Gen: NAD HEENT: oropharynx clear, opens jaw easily Neck: full range of motion of neck. No thyroid nodules palpable. No lymphadenopathy. Heart: tachycardic but regular rhythm, no murmurs auscultated Lungs: NWOB, CTAB Neuro: grossly nonfocal, speech intact. Grip strength 5/5 bilaterally. Shoulder abduction 5/5 b/l as well. Face symmetric. Ext: no lower extremity edema

## 2012-08-22 ENCOUNTER — Other Ambulatory Visit: Payer: Self-pay | Admitting: Family Medicine

## 2012-08-23 ENCOUNTER — Telehealth: Payer: Self-pay | Admitting: Family Medicine

## 2012-08-23 NOTE — Telephone Encounter (Signed)
Pt wants to know if there is a prescription for a bio-feedback machine for pain Please advise

## 2012-08-24 NOTE — Telephone Encounter (Signed)
I am not aware of one; if there is one available it should come from her pain medicine doctor. Please let her know this. Thanks!

## 2012-09-06 ENCOUNTER — Ambulatory Visit: Payer: Medicaid Other | Admitting: Family Medicine

## 2012-09-11 ENCOUNTER — Other Ambulatory Visit: Payer: Self-pay | Admitting: Family Medicine

## 2012-09-12 ENCOUNTER — Ambulatory Visit: Payer: Medicaid Other | Admitting: Sports Medicine

## 2012-09-19 ENCOUNTER — Ambulatory Visit (INDEPENDENT_AMBULATORY_CARE_PROVIDER_SITE_OTHER): Payer: Medicaid Other | Admitting: Family Medicine

## 2012-09-19 ENCOUNTER — Ambulatory Visit (HOSPITAL_COMMUNITY)
Admission: RE | Admit: 2012-09-19 | Discharge: 2012-09-19 | Disposition: A | Discharge status: Home / Self Care | Payer: Medicaid Other | Source: Ambulatory Visit | Attending: Family Medicine | Admitting: Family Medicine

## 2012-09-19 ENCOUNTER — Encounter: Payer: Self-pay | Admitting: Family Medicine

## 2012-09-19 VITALS — BP 122/88 | HR 128 | Temp 98.2°F | Ht 65.0 in | Wt 137.6 lb

## 2012-09-19 DIAGNOSIS — I498 Other specified cardiac arrhythmias: Secondary | ICD-10-CM | POA: Insufficient documentation

## 2012-09-19 DIAGNOSIS — G894 Chronic pain syndrome: Secondary | ICD-10-CM

## 2012-09-19 DIAGNOSIS — R000 Tachycardia, unspecified: Secondary | ICD-10-CM

## 2012-09-19 NOTE — Patient Instructions (Addendum)
Follow up with your pain medicine doctor to treat your chronic pain. Come back to see me in 1-2 months to see how the heart rate is doing. If you have any chest pain that does not go away within 30 minutes, is accompanied by nausea, sweating, shortness of breath, or made worse by activity, go to the emergency room immediately for evaluation.  Dr. Pollie Meyer

## 2012-09-19 NOTE — Progress Notes (Signed)
Patient ID: Tasha Fernandez, female   DOB: 1970/05/22, 42 y.o.   MRN: [id number]   HPI:  Tachycardia: Noted at previous visit. She checked her heart rate at home but did not bring in any numbers with her. ROS is positive for occasional chest pain, which she states is related to pain and anxiety from her chronic neck/jaw pain. Also endorses occasional shortness of breath when pain gets very severe, all related to her pain.  Pain: Was seen by a psychologist (Dr. Lynann Beaver) for biofeedback. States she was taught a breathing technique to cope with pain, but was hoping for some sort of biofeedback machine. She tries to do the breathing exercises as often as she can. The last time she saw her pain medicine doctor was over a month ago. Is currently taking flexeril for pain, but is taking two to three 10mg  tablets at a time. Speaks a lot of wanting to go see oral surgeon at [facility].  ROS: See HPI  PMFSH: hx chronic neck/jaw pain  PHYSICAL EXAM: BP 122/88  Pulse 128  Temp(Src) 98.2 F (36.8 C) (Oral)  Ht 5\' 5"  (1.651 m)  Wt 137 lb 9.6 oz (62.415 kg)  BMI 22.9 kg/m2  LMP 09/18/2012 Gen: NAD, extremely talkative. Wearing soft neck brace. Heart: tachycardic, regular rhythm Lungs: CTAB, NWOB Neuro: grossly nonfocal, speech intact. Grip strength 5/5 bilaterally. Full ROM of neck. Shoulder abduction  & adduction 5/5 bilaterally. R TMJ area tender to palpation. Posterior back muscles tense. Psych: tearful at times when discussing chronic pain, perseverates on need for surgery to fix her problems  ASSESSMENT/PLAN:  # see problem based charting # f/u in 1-2 months

## 2012-09-20 ENCOUNTER — Telehealth: Payer: Self-pay | Admitting: Family Medicine

## 2012-09-20 DIAGNOSIS — G894 Chronic pain syndrome: Secondary | ICD-10-CM

## 2012-09-20 LAB — PRESCRIPTION MONITORING PROFILE (SOLSTAS)
Amphetamine/Meth: NEGATIVE ng/mL
Barbiturate Screen, Urine: NEGATIVE ng/mL
Benzodiazepine Screen, Urine: NEGATIVE ng/mL
Buprenorphine, Urine: NEGATIVE ng/mL
Fentanyl, Ur: NEGATIVE ng/mL
Methadone Screen, Urine: NEGATIVE ng/mL
Nitrites, Initial: NEGATIVE ug/mL
Opiate Screen, Urine: NEGATIVE ng/mL
Propoxyphene: NEGATIVE ng/mL
Tapentadol, urine: NEGATIVE ng/mL

## 2012-09-20 NOTE — Telephone Encounter (Signed)
Pt is wanting a referral to pain management. JW

## 2012-09-20 NOTE — Telephone Encounter (Signed)
Will fwd. To PCP .Arlyss Repress Please review.

## 2012-09-21 NOTE — Telephone Encounter (Signed)
Okay to refer to Dr. Letha Cape office for pain management. I am not sure why we are having to re-refer her as she is an established patient there. Order entered in epic. Please notify patient.

## 2012-09-24 NOTE — Telephone Encounter (Signed)
I called Dr. Letha Cape office in regards to referral.  I was informed that Ms. Abdou called their office requesting an appointment and was told that Dr. Cherrie Distance was out of the office and they needed to wait for him to get back in town to review her records from the specialist he had referred her to, so they would know how to schedule her.  I was told that she did not need a new referral and that once Dr. Cherrie Distance reviews her records, they will call and schedule her an appointment.  Ileana Ladd

## 2012-09-29 ENCOUNTER — Emergency Department (HOSPITAL_COMMUNITY)
Admission: EM | Admit: 2012-09-29 | Discharge: 2012-09-29 | Disposition: A | Discharge status: Home / Self Care | Payer: Medicaid Other | Source: Home / Self Care

## 2012-09-29 ENCOUNTER — Encounter (HOSPITAL_COMMUNITY): Payer: Self-pay | Admitting: *Deleted

## 2012-09-29 DIAGNOSIS — H6091 Unspecified otitis externa, right ear: Secondary | ICD-10-CM

## 2012-09-29 DIAGNOSIS — H60399 Other infective otitis externa, unspecified ear: Secondary | ICD-10-CM

## 2012-09-29 MED ORDER — HYDROCODONE-ACETAMINOPHEN 5-325 MG PO TABS: 2.0000 | ORAL_TABLET | ORAL | Status: DC | PRN

## 2012-09-29 MED ORDER — AMOXICILLIN 500 MG PO CAPS: 500.0000 mg | ORAL_CAPSULE | Freq: Three times a day (TID) | ORAL | Status: DC

## 2012-09-29 MED ORDER — NEOMYCIN-POLYMYXIN-HC 3.5-10000-1 OT SUSP: 4.0000 [drp] | Freq: Three times a day (TID) | OTIC | Status: DC

## 2012-09-29 NOTE — ED Provider Notes (Signed)
Medical screening examination/treatment/procedure(s) were performed by a resident physician or non-physician practitioner and as the supervising physician I was immediately available for consultation/collaboration.  Clementeen Graham, MD   Rodolph Bong, MD 09/29/12 2004

## 2012-09-29 NOTE — ED Notes (Signed)
Pt  Reports   Symptoms  Of  r  Earache      Neck  Pain            Headache          /  sorethroat          Symptoms           X  4  Days           Pt  Was  Noted  To      Have  A  Collar on   In place  On arrival

## 2012-09-29 NOTE — ED Provider Notes (Signed)
CSN: [id number]     Arrival date & time 09/29/12  1641 History     First MD Initiated Contact with Patient 09/29/12 1815     Chief Complaint  Patient presents with  . Otalgia   (Consider location/radiation/quality/duration/timing/severity/associated sxs/prior Treatment) Patient is a 42 y.o. female presenting with ear pain. The history is provided by the patient. No language interpreter was used.  Otalgia Location:  Right Quality:  Aching Severity:  Moderate Onset quality:  Sudden Timing:  Constant Progression:  Worsening Worsened by:  Nothing tried Ineffective treatments:  None tried Pt went swimming recently now has swollen painful ear  Past Medical History  Diagnosis Date  . Seasonal allergies   . Anxiety   . TMJ (dislocation of temporomandibular joint)    Past Surgical History  Procedure Laterality Date  . Cholecystectomy    . Cesarean section    . Tubal ligation     History reviewed. No pertinent family history. History  Substance Use Topics  . Smoking status: Never Smoker   . Smokeless tobacco: Never Used  . Alcohol Use: No     Comment: social   OB History   Grav Para Term Preterm Abortions TAB SAB Ect Mult Living                 Review of Systems  HENT: Positive for ear pain.   All other systems reviewed and are negative.    Allergies  Review of patient's allergies indicates no known allergies.  Home Medications   Current Outpatient Rx  Name  Route  Sig  Dispense  Refill  . amitriptyline (ELAVIL) 25 MG tablet   Oral   Take 1 tablet (25 mg total) by mouth at bedtime. For one week then increase to 50mg  at bedtime   60 tablet   1   . aspirin-acetaminophen-caffeine (EXCEDRIN MIGRAINE) 250-250-65 MG per tablet   Oral   Take 1 tablet by mouth every 6 (six) hours as needed for pain.         . Aspirin-Acetaminophen-Caffeine (GOODY HEADACHE PO)   Oral   Take 1 packet by mouth every 6 (six) hours as needed (for pain).         .  cyclobenzaprine (FLEXERIL) 10 MG tablet   Oral   Take 1 tablet (10 mg total) by mouth 3 (three) times daily as needed for muscle spasms.   30 tablet   0   . diclofenac sodium (VOLTAREN) 1 % GEL   Topical   Apply 4 g topically 4 (four) times daily as needed.   1 Tube   1   . naproxen sodium (ANAPROX) 220 MG tablet   Oral   Take 220 mg by mouth 2 (two) times daily as needed.         . pregabalin (LYRICA) 150 MG capsule   Oral   Take 1 capsule (150 mg total) by mouth 2 (two) times daily.   60 capsule   3    BP 147/94  Pulse 108  Temp(Src) 97.6 F (36.4 C) (Oral)  SpO2 100%  LMP 09/18/2012 Physical Exam  Nursing note and vitals reviewed. Constitutional: She appears well-developed.  HENT:  Head: Normocephalic.  Mouth/Throat: Oropharynx is clear and moist.  Swollen right ear canal,  Painful to palpation  Eyes: Pupils are equal, round, and reactive to light.  Neck: Normal range of motion. Neck supple.  Cardiovascular: Normal rate.   Pulmonary/Chest: Effort normal.  Musculoskeletal: Normal range of motion.  Neurological: She  is alert.  Skin: Skin is warm.  Psychiatric: She has a normal mood and affect.    ED Course   Procedures (including critical care time)  Labs Reviewed - No data to display No results found. 1. Otitis externa of right ear     MDM  (Hx of chronic pain)   Pt denies any contracts/pain management   I will give hydrocodone,  amoxicillian and cortisporin otic,  Recheck fpc in 3-4 days  Elson Areas, PA-C 09/29/12 1842

## 2012-10-03 ENCOUNTER — Ambulatory Visit (INDEPENDENT_AMBULATORY_CARE_PROVIDER_SITE_OTHER): Payer: Medicaid Other | Admitting: Sports Medicine

## 2012-10-03 ENCOUNTER — Ambulatory Visit: Payer: Medicaid Other | Admitting: Sports Medicine

## 2012-10-03 ENCOUNTER — Encounter: Payer: Self-pay | Admitting: Sports Medicine

## 2012-10-03 VITALS — BP 114/70 | HR 107 | Temp 97.8°F | Wt 137.7 lb

## 2012-10-03 DIAGNOSIS — G8929 Other chronic pain: Secondary | ICD-10-CM

## 2012-10-03 DIAGNOSIS — J309 Allergic rhinitis, unspecified: Secondary | ICD-10-CM

## 2012-10-03 DIAGNOSIS — R6884 Jaw pain: Secondary | ICD-10-CM

## 2012-10-03 DIAGNOSIS — H60391 Other infective otitis externa, right ear: Secondary | ICD-10-CM

## 2012-10-03 DIAGNOSIS — J302 Other seasonal allergic rhinitis: Secondary | ICD-10-CM | POA: Insufficient documentation

## 2012-10-03 DIAGNOSIS — H60399 Other infective otitis externa, unspecified ear: Secondary | ICD-10-CM

## 2012-10-03 MED ORDER — KETOROLAC TROMETHAMINE 30 MG/ML IJ SOLN
30.0000 mg | Freq: Once | INTRAMUSCULAR | Status: AC
  Administered 2012-10-03: 30 mg via INTRAMUSCULAR

## 2012-10-03 NOTE — Assessment & Plan Note (Signed)
Patient reports a significant history of seasonal allergies.  She's not been taking any medications.  She is perseverative on the fact that this is caused all of her symptoms. I'll start her on Flonase and have her followup with her PCP for further evaluation

## 2012-10-03 NOTE — Progress Notes (Signed)
  [facility]  Patient name: Tasha Fernandez MRN [id number]  Date of birth: 14-Apr-1970  CC & HPI:  Tasha Fernandez is a 42 y.o. female presenting to clinic.  Chief Complaint  Patient presents with  . Otalgia Patient is well known to me from prior office visits for osteopathic manipulation who presents today for followup of otitis externa diagnosed 5 days ago at the urgent care.  She reports she has had a nonspecific right ear ache for an unspecified amount of time .  She recently started medications for this 3 days ago and reports 100% compliance with her antibiotics and otic drops..  She has continued to have pain and is unable to specify if she thinks that is getting better.  It was reported that her right ear was completely closed previously and red and erythematous.  She denies any overt fevers or chills.  She continues to talk excessively about her chronic ongoing sinusitis , TMJ dysfunction,      ROS:  PER HPI  Pertinent History Reviewed:  Medical & Surgical Hx:  Reviewed: Significant for chronic jaw pain, chronic pain syndrome, chronic headache, service out of, anxiety, Medications: Reviewed & Updated - see associated section Social History: Reviewed -  reports that she has never smoked. She has never used smokeless tobacco.  Objective Findings:  Vitals: BP 114/70  Pulse 107  Temp(Src) 97.8 F (36.6 C) (Oral)  Wt 137 lb 11.2 oz (62.46 kg)  BMI 22.91 kg/m2  LMP 09/18/2012 PE: GENERAL:  adult Caucasian female. In no discomfort; no respiratory distress  PSYCH:  alert and appropriate, good insight   HNEENT:  H&N: AT/, trachea midline, no cervical lymphadenopathy   Eyes: no scleral icterus, no conjunctival exudate  Ears:  right external ear is normal appearing, there is slight pustular exudate within the external auditory canal but this is widely patent.  Tympanic membrane is pearly gray without erythema bilaterally   Nose:  no exudate, mildly  erythematous nasal mucosa   Oropharynx: MMM no significant exudate.    Dentention:        Assessment & Plan:   1. Chronic jaw pain   2. Otitis, externa, infective, right   3. Seasonal allergies    See problem associated charting

## 2012-10-03 NOTE — Assessment & Plan Note (Signed)
Given significant history of will not be prescribing any opioid pain medications.  I would advise avoiding referral to ENT she has been evaluated by multiple specialists in the past.  I do think the treating her underlying seasonal allergies is likely important.

## 2012-10-03 NOTE — Assessment & Plan Note (Addendum)
Widely patent on exam today there is still exudate.  She is to continue taking her antibiotics and finish as appropriate.  I will not be writing her further pain medication this time as this is an inflammatory condition.  She is to continue taking her Anaprox and she will be provided a dose of Toradol in the office today.    Patient was concerned due to persistent wax that she feels is always in her years.  She is recommended to use DEBROX on a weekly basis as a prophylactic treatment

## 2012-10-03 NOTE — Patient Instructions (Signed)
Please continue using your antibiotics and eardrops.  You can continue taking your naproxen for the pain.  This should continue to improve over the next 10 days.    I have sent in a medication to help your seasonal allergies and sinus problems.   This is a nasal spray that you must use on a daily basis.  Please follow up with your PCP to further discuss your allergies.  For your earwax you can try using DEBROX on a weekly basis per instructions on the box

## 2012-10-05 ENCOUNTER — Telehealth: Payer: Self-pay | Admitting: Family Medicine

## 2012-10-05 NOTE — Telephone Encounter (Signed)
Will forward to Dr McIntyre 

## 2012-10-05 NOTE — Telephone Encounter (Signed)
Ear drops were not meant to be a long term medication, and were ordered by urgent care doctor. She should not need a refill on this, if still having symptoms needs to be seen.  Also, I am NOT entering a referral ENT. I have told patient before that I will not refer her to specialists unless I have written documentation from her pain medicine doctor that he feels she needs further specialty involvement. She has been seen for this condition in the past by multiple specialists.   Please call patient to inform.  Latrelle Dodrill, MD

## 2012-10-05 NOTE — Telephone Encounter (Signed)
Patients states she is out of ear drops. Also, need a referral to ENT MD. States she already has an appt on 9/9 @ 10:30am @ [address].

## 2012-10-06 ENCOUNTER — Emergency Department (HOSPITAL_COMMUNITY)
Admission: EM | Admit: 2012-10-06 | Discharge: 2012-10-06 | Disposition: A | Discharge status: Home / Self Care | Payer: Medicaid Other | Source: Home / Self Care

## 2012-10-06 ENCOUNTER — Encounter (HOSPITAL_COMMUNITY): Payer: Self-pay | Admitting: Emergency Medicine

## 2012-10-06 DIAGNOSIS — G8929 Other chronic pain: Secondary | ICD-10-CM

## 2012-10-06 DIAGNOSIS — H9209 Otalgia, unspecified ear: Secondary | ICD-10-CM

## 2012-10-06 NOTE — ED Notes (Signed)
Pt c/o bilateral ear pain, neck pain and shoulder pain Seen on 8/23 for same sxs... Had a soft collar on arrival Alert w/no signs ofa cute distress.

## 2012-10-06 NOTE — ED Provider Notes (Signed)
CSN: [id number]     Arrival date & time 10/06/12  1232 History   None    Chief Complaint  Patient presents with  . Otalgia   (Consider location/radiation/quality/duration/timing/severity/associated sxs/prior Treatment) Patient is a 42 y.o. female presenting with ear pain. The history is provided by the patient.  Otalgia Location:  Right Behind ear:  No abnormality Quality:  Sharp Severity:  Mild Duration:  1 week (seen 8/23 at [facility], here for recheck.) Associated symptoms: no congestion, no ear discharge, no rhinorrhea and no tinnitus     Past Medical History  Diagnosis Date  . Seasonal allergies   . Anxiety   . TMJ (dislocation of temporomandibular joint)    Past Surgical History  Procedure Laterality Date  . Cholecystectomy    . Cesarean section    . Tubal ligation     No family history on file. History  Substance Use Topics  . Smoking status: Never Smoker   . Smokeless tobacco: Never Used  . Alcohol Use: No     Comment: social   OB History   Grav Para Term Preterm Abortions TAB SAB Ect Mult Living                 Review of Systems  Constitutional: Negative.   HENT: Positive for ear pain. Negative for congestion, rhinorrhea, tinnitus and ear discharge.     Allergies  Review of patient's allergies indicates no known allergies.  Home Medications   Current Outpatient Rx  Name  Route  Sig  Dispense  Refill  . amitriptyline (ELAVIL) 25 MG tablet   Oral   Take 1 tablet (25 mg total) by mouth at bedtime. For one week then increase to 50mg  at bedtime   60 tablet   1   . amoxicillin (AMOXIL) 500 MG capsule   Oral   Take 1 capsule (500 mg total) by mouth 3 (three) times daily.   28 capsule   0   . aspirin-acetaminophen-caffeine (EXCEDRIN MIGRAINE) 250-250-65 MG per tablet   Oral   Take 1 tablet by mouth every 6 (six) hours as needed for pain.         . Aspirin-Acetaminophen-Caffeine (GOODY HEADACHE PO)   Oral   Take 1 packet by mouth every 6 (six)  hours as needed (for pain).         . cyclobenzaprine (FLEXERIL) 10 MG tablet   Oral   Take 1 tablet (10 mg total) by mouth 3 (three) times daily as needed for muscle spasms.   30 tablet   0   . diclofenac sodium (VOLTAREN) 1 % GEL   Topical   Apply 4 g topically 4 (four) times daily as needed.   1 Tube   1   . HYDROcodone-acetaminophen (NORCO/VICODIN) 5-325 MG per tablet   Oral   Take 2 tablets by mouth every 4 (four) hours as needed for pain.   16 tablet   0   . naproxen sodium (ANAPROX) 220 MG tablet   Oral   Take 220 mg by mouth 2 (two) times daily as needed.         . neomycin-polymyxin-hydrocortisone (CORTISPORIN) 3.5-10000-1 otic suspension   Right Ear   Place 4 drops into the right ear 3 (three) times daily.   5 mL   0   . pregabalin (LYRICA) 150 MG capsule   Oral   Take 1 capsule (150 mg total) by mouth 2 (two) times daily.   60 capsule   3  BP 92/62  Pulse 104  Temp(Src) 97.3 F (36.3 C) (Oral)  Resp 18  SpO2 97%  LMP 09/18/2012 Physical Exam  Nursing note and vitals reviewed. Constitutional: She is oriented to person, place, and time. She appears well-developed and well-nourished.  HENT:  Head: Normocephalic.  Right Ear: External ear normal.  Left Ear: External ear normal.  Nose: Nose normal.  Mouth/Throat: Oropharynx is clear and moist.  Eyes: Conjunctivae are normal. Pupils are equal, round, and reactive to light.  Neck: Normal range of motion. Neck supple.  Lymphadenopathy:    She has no cervical adenopathy.  Neurological: She is alert and oriented to person, place, and time.  Skin: Skin is warm and dry.  Psychiatric: She has a normal mood and affect.    ED Course  Procedures (including critical care time) Labs Review Labs Reviewed - No data to display Imaging Review No results found.  MDM   1. Chronic ear pain, right       Linna Hoff, MD 10/06/12 1332

## 2012-10-08 NOTE — Assessment & Plan Note (Signed)
Patient continues to perseverate on needing referrals and thinking she needs surgery to fix her pain. As she endorses using 2-3 tablets of flexeril at a time, I will not be providing her with any more of this medicine. When I approached the topic of having her give a urine sample for drug testing due to her unexplained tachycardia, patient became very resistant and repeatedly asked if she could give a sample on a different day. She did endorse having used a friend's pain medicine one time several weeks ago. She did eventually agree to give a urine sample, however it was thick with menstrual blood and I question whether she may have actually mixed her menstrual blood with water for the specimen. We attempted to do a urine dipstick in the laboratory on her sample, but were unable to obtain a specific gravity. Will send specimen for testing anyway. I have instructed her to follow up with her pain medicine doctor and reiterated to her that I will not be referring her to any specialists unless her pain medicine doctor thinks she warrants additional referrals.

## 2012-10-08 NOTE — Assessment & Plan Note (Addendum)
Pt remains tachycardic, without good explanation. See above note regarding testing of urine for illicit drugs. EKG done today and shows sinus tachycardia. Pt does endorse chest discomfort but it seems more related to her chronic neck/jaw pain. Will continue to monitor heart rate at future visits. Given chest pain return precautions per AVS. Precepted with Dr. Jennette Kettle.

## 2012-10-09 ENCOUNTER — Ambulatory Visit: Payer: Medicaid Other | Admitting: Sports Medicine

## 2012-10-09 NOTE — Telephone Encounter (Signed)
Pt states that her neurologist is treating her pain and he suggested ent. Dr. Loletha Carrow states that he can't do anything else for her pain and will no longer be taking care of her pain . Pt reports that she needs referral for TMJ due to pain  . States that she needs referral for seasonal allergies that makes TMJ worse. Pt is rambling " i have to be treated, I have to do something, it is effecting my ears and the pressure in my head and I have to have something done. It needs to be a quick decision or I am going to have to make it myself" Wyatt Haste, RN-BSN

## 2012-10-10 ENCOUNTER — Telehealth: Payer: Self-pay | Admitting: Family Medicine

## 2012-10-10 NOTE — Telephone Encounter (Signed)
Please call her to explain why no referral can be made to ENT.  Her appt is 10-16-12 Dr Dimas Chyle. She had been referred by her neurologist to the ENT Please advise

## 2012-10-11 NOTE — Telephone Encounter (Signed)
Returned patient's call. Pt continues to perseverate on her chronic pain problems and is very difficult to speak with on the phone. I informed her that I will not refer her to ENT at this time as I did not originally authorize this referral. If she is able to have either her pain medicine doctor or her neurologist send our office documentation that they believe she would benefit from an ENT referral, then I would be happy to enter this. She states she will attempt contact those offices and have them send [place] that documentation.  Latrelle Dodrill, MD

## 2012-10-16 ENCOUNTER — Ambulatory Visit: Payer: Medicaid Other | Admitting: Family Medicine

## 2012-10-16 ENCOUNTER — Telehealth: Payer: Self-pay | Admitting: Family Medicine

## 2012-10-16 DIAGNOSIS — H9201 Otalgia, right ear: Secondary | ICD-10-CM

## 2012-10-16 NOTE — Telephone Encounter (Signed)
Called [facility] ENT [phone] and pt had appt today and patient called and cancelled appt with ENT.   Adebayo Ensminger, Darlyne Russian, CMA

## 2012-10-16 NOTE — Telephone Encounter (Signed)
Received documentation from pt's neurologist regarding suggested referral to ENT at [facility]. After discussion with preceptor Dr. Jennette Kettle, will authorize this referral. [facility] red team, please call patient to inform her. Also please call ENT office (see prior phone notes) and authorize referral by providing NPI number. Thanks!  Latrelle Dodrill, MD

## 2012-10-19 ENCOUNTER — Ambulatory Visit (INDEPENDENT_AMBULATORY_CARE_PROVIDER_SITE_OTHER): Payer: Medicaid Other | Admitting: Sports Medicine

## 2012-10-19 ENCOUNTER — Encounter: Payer: Self-pay | Admitting: Sports Medicine

## 2012-10-19 VITALS — BP 117/78 | HR 103 | Temp 97.7°F | Ht 65.0 in | Wt 139.1 lb

## 2012-10-19 DIAGNOSIS — G894 Chronic pain syndrome: Secondary | ICD-10-CM

## 2012-10-19 DIAGNOSIS — M9981 Other biomechanical lesions of cervical region: Secondary | ICD-10-CM

## 2012-10-19 DIAGNOSIS — R51 Headache: Secondary | ICD-10-CM

## 2012-10-19 DIAGNOSIS — M999 Biomechanical lesion, unspecified: Secondary | ICD-10-CM

## 2012-10-19 LAB — COMPREHENSIVE METABOLIC PANEL
Albumin: 4 g/dL (ref 3.5–5.2)
BUN: 17 mg/dL (ref 6–23)
CO2: 28 mEq/L (ref 19–32)
Glucose, Bld: 84 mg/dL (ref 70–99)
Sodium: 136 mEq/L (ref 135–145)
Total Bilirubin: 0.4 mg/dL (ref 0.3–1.2)
Total Protein: 7.1 g/dL (ref 6.0–8.3)

## 2012-10-19 LAB — CBC
MCV: 83.8 fL (ref 78.0–100.0)
Platelets: 433 10*3/uL — ABNORMAL HIGH (ref 150–400)
RBC: 4.2 MIL/uL (ref 3.87–5.11)
WBC: 7.5 10*3/uL (ref 4.0–10.5)

## 2012-10-19 MED ORDER — DIVALPROEX SODIUM 500 MG PO DR TAB: 500.0000 mg | DELAYED_RELEASE_TABLET | Freq: Every day | ORAL | Status: DC

## 2012-10-19 NOTE — Patient Instructions (Signed)
It was nice to see you today, thanks for coming in!  1. Chronic headache Start - divalproex (DEPAKOTE) 500 MG DR tablet; Take 1 tablet (500 mg total) by mouth daily.  Dispense: 30 tablet; Refill: 1  We will need to be checking labs on a more regular basis.    Please followup with Dr. Pollie Meyer in 1-2 weeks.    Do your stretching exercises 3 times a day.  Use the towel injured it forward, backward, side-to-side.  Hold this position for 5 seconds against resistance then relax and repeat 5 times.   If you need anything prior to your next visit please call the clinic. Please Bring all medications or accurate medication list with you to each appointment; an accurate medication list is essential in providing you the best care possible.

## 2012-10-19 NOTE — Progress Notes (Signed)
f °

## 2012-10-22 NOTE — Assessment & Plan Note (Addendum)
Decision today to treat with OMT was based on Physical Exam.  Verbal consent was obtained after explanation of risks, benefits and potential side-effects including acute pain flare, post-manipulation soreness, and need for repeat treatments.    The Patient was treated with Cranial, MFR, ME, CS & HVLA techniques in Cranial, Cervical, Thoracic, Rib areas  Patient tolerated the procedure well with improvement in symptoms.  Patient given medications, exercises, stretches and lifestyle modifications per AVS and verbally.    Patient will follow up in 4 weeks or PRN

## 2012-10-22 NOTE — Progress Notes (Signed)
  [facility]  Patient name: Tasha Fernandez MRN [id number]  Date of birth: Jun 26, 1970  CC & HPI:  Tasha Fernandez is a 42 y.o. female presenting today for Osteopathic Manipulation:  Pt reports pain in: Location  bilateral neck, while a jaw, right shoulder  & chronic headaches  Onset  greater than 2 years   Character  sharp cramping burning that is unchanged in nature  Severity  "very painful"    Temporal  all the time  Alleviating  nothing and she has tried multiple things including multiple pain specalities.  Requesting cortisone injections reports she has never tried injections before  Aggrivating  anything    Other Issues to discuss today: # Mood & Headches:  Never tried any preventative meds for headaches due to hesitancy to take any medications  ROS:  Denies fevers chills nausea vomiting  Pertinent History Reviewed:  Medical & Surgical Hx:  Reviewed: Significant for anxiety, GERD, chronic headache, chronic jaw pain he was seen multiple specialist Medications: Reviewed & Updated - see associated section Social History: Reviewed -  reports that she has never smoked. She has never used smokeless tobacco.  Objective Findings:  Vitals: BP 117/78  Pulse 103  Temp(Src) 97.7 F (36.5 C) (Oral)  Ht 5\' 5"  (1.651 m)  Wt 139 lb 1.6 oz (63.095 kg)  BMI 23.15 kg/m2  LMP 09/18/2012  PE: GENERAL:  Adult thin, female. In no discomfort; no respiratory distress. PSYCH: Bizarre affect, tangential and perseverative on subjects interesting her MSK/Osteopathic: Negative Spurling's compression tests, negative vertebral artery insufficiency testing NEURO EXAM:  UE: Strength is 5+ out of 5 bilaterally, sensation is grossly intact, and DTRs are 1+ out of 4   Findings:  Diagnoses: CERVICAL   C2 flexed rotated to left   C4 flexed rotated to right  C6 flexed rotated left   THORACIC   T2 flexed rotated left   RIBS   posterior rib 4 on right   Cranial  No TJM  dysfunction noted   Assessment & Plan:

## 2012-10-22 NOTE — Assessment & Plan Note (Signed)
Patient continuing to perseverate on her TMJ although there is no dysfunction noted on exam.  Have discussed with her the option of starting preventative medication for headaches and she has agreed to initiate Depakote.  This will need to be followed by her PCP who I have discussed this with.  I'd at baseline labs today and she will follow Dr. Pollie Meyer next 2 weeks.

## 2012-10-25 ENCOUNTER — Ambulatory Visit: Payer: Medicaid Other | Admitting: Family Medicine

## 2012-10-29 ENCOUNTER — Ambulatory Visit: Payer: Medicaid Other | Admitting: Sports Medicine

## 2012-10-31 ENCOUNTER — Telehealth: Payer: Self-pay | Admitting: Family Medicine

## 2012-10-31 NOTE — Telephone Encounter (Signed)
Received phone call from Dr. Ocie Doyne ([facility]) regarding patient. Pt scheduled her own appointment there for TMJ problems. Dr. Barbette Merino reviewed her MRI and found no abnormalities with TMJ joint. He wanted to discuss referral to neurology, but I informed him that pt already has neurologist at [facility]. She is also asking him for referral to the [facility]. I explained that we have had to be very judicious in authorizing referrals for patient, because she always wants referral to a variety of doctors at each visit. He agrees that a large part of this pain is likely psychiatric in etiology. I have recommended against additional referral to neurology (as she already has neurologist) at this time.  Latrelle Dodrill, MD

## 2012-11-06 ENCOUNTER — Encounter: Payer: Self-pay | Admitting: Family Medicine

## 2012-11-06 ENCOUNTER — Ambulatory Visit (INDEPENDENT_AMBULATORY_CARE_PROVIDER_SITE_OTHER): Payer: Medicaid Other | Admitting: Family Medicine

## 2012-11-06 VITALS — BP 115/78 | HR 80 | Temp 98.6°F | Ht 65.0 in | Wt 139.9 lb

## 2012-11-06 DIAGNOSIS — G894 Chronic pain syndrome: Secondary | ICD-10-CM

## 2012-11-06 NOTE — Progress Notes (Signed)
Patient ID: Tasha Fernandez, female   DOB: 10/09/1970, 42 y.o.   MRN: [id number]  HPI:  Patient presents today to discuss chest discomfort. She relates this chest discomfort to her chronic neck/jaw/earTMJ/shoulder pain. She last saw her neurologist Dr. Cherrie Distance in June or July of this year. She then saw a psychologist at [facility] for a biofeedback session, but states she is dissatisfied with that visit. She states that she is having this chest discomfort for several years. It is a burning type pain. She has been diagnosed in the past with acid reflux. The pain is not worse with spicy foods. It is not worse with exertion. She has no shortness of breath with exertion. The pain is worse whenever her chronic pain in her jaw flares up.  ROS: See HPI  PMFSH: History of chronic pain syndrome, and has been evaluated extensively by multiple physicians in the past.  PHYSICAL EXAM: BP 115/78  Pulse 80  Temp(Src) 98.6 F (37 C) (Oral)  Ht 5\' 5"  (1.651 m)  Wt 139 lb 14.4 oz (63.458 kg)  BMI 23.28 kg/m2  LMP 10/16/2012 Gen: NAD, perseverates on chronic pain and possible options for curing herself of this pain HEENT: TMs clear bilaterally. No oropharyngeal exudates. Endorses tenderness with palpation of TMJ joint. Heart: RRR, no murmurs Lungs: CTAB, normal work of breathing Neuro: grossly nonfocal, speech intact. Strength 5 out of 5 in arms with shoulder abduction and abduction, also although flexion and extension. Poor effort on grip strength testing. Psych: continues to perseverate on chronic neck pain. Speech is extremely pressured, difficult to get any words in.   ASSESSMENT/PLAN:  See problem based charting for assessment/plan.   FOLLOW UP: F/u in one month for chronic pain.

## 2012-11-06 NOTE — Patient Instructions (Signed)
It was great to see you again today!  I would like to see you back in one month to follow up on your pain. Please see Dr. Atilano Median in the mean time.  Be well, Dr. Pollie Meyer

## 2012-11-06 NOTE — Assessment & Plan Note (Signed)
No physical abnormalities on exam to suggest etiology for her pain. I continue to suspect this is all psychiatric/psychological. The chest discomfort she endorses today seems entirely related to her chronic pain. History is not consistent with angina. I attempted to discuss with Zamantha today that this pain may actually continue to endure and it may actually be best to switch our focus to ways of coping with the pain. She was resistant to this mindset. I encouraged her to followup with her pain medicine physician. I have asked her to followup with me in clinic in one month. Precepted this visit with Dr. Deirdre Priest and Dr. Pascal Lux, who watched this encounter via video precepting and who agree with this plan.

## 2012-11-08 ENCOUNTER — Ambulatory Visit: Payer: Medicaid Other | Admitting: Sports Medicine

## 2013-01-14 ENCOUNTER — Emergency Department (HOSPITAL_COMMUNITY)
Admission: EM | Admit: 2013-01-14 | Discharge: 2013-01-14 | Disposition: A | Discharge status: Home / Self Care | Payer: Medicaid Other | Attending: Emergency Medicine | Admitting: Emergency Medicine

## 2013-01-14 ENCOUNTER — Encounter (HOSPITAL_COMMUNITY): Payer: Self-pay | Admitting: Emergency Medicine

## 2013-01-14 DIAGNOSIS — Z8719 Personal history of other diseases of the digestive system: Secondary | ICD-10-CM | POA: Insufficient documentation

## 2013-01-14 DIAGNOSIS — M545 Low back pain, unspecified: Secondary | ICD-10-CM | POA: Insufficient documentation

## 2013-01-14 DIAGNOSIS — M549 Dorsalgia, unspecified: Secondary | ICD-10-CM

## 2013-01-14 DIAGNOSIS — Z792 Long term (current) use of antibiotics: Secondary | ICD-10-CM | POA: Insufficient documentation

## 2013-01-14 DIAGNOSIS — Z8659 Personal history of other mental and behavioral disorders: Secondary | ICD-10-CM | POA: Insufficient documentation

## 2013-01-14 DIAGNOSIS — Z9109 Other allergy status, other than to drugs and biological substances: Secondary | ICD-10-CM | POA: Insufficient documentation

## 2013-01-14 DIAGNOSIS — M542 Cervicalgia: Secondary | ICD-10-CM | POA: Insufficient documentation

## 2013-01-14 DIAGNOSIS — M129 Arthropathy, unspecified: Secondary | ICD-10-CM | POA: Insufficient documentation

## 2013-01-14 DIAGNOSIS — Z8739 Personal history of other diseases of the musculoskeletal system and connective tissue: Secondary | ICD-10-CM | POA: Insufficient documentation

## 2013-01-14 DIAGNOSIS — Z3202 Encounter for pregnancy test, result negative: Secondary | ICD-10-CM | POA: Insufficient documentation

## 2013-01-14 DIAGNOSIS — M79609 Pain in unspecified limb: Secondary | ICD-10-CM | POA: Insufficient documentation

## 2013-01-14 DIAGNOSIS — G8929 Other chronic pain: Secondary | ICD-10-CM | POA: Insufficient documentation

## 2013-01-14 DIAGNOSIS — R1084 Generalized abdominal pain: Secondary | ICD-10-CM | POA: Insufficient documentation

## 2013-01-14 DIAGNOSIS — K08109 Complete loss of teeth, unspecified cause, unspecified class: Secondary | ICD-10-CM | POA: Insufficient documentation

## 2013-01-14 DIAGNOSIS — M79604 Pain in right leg: Secondary | ICD-10-CM

## 2013-01-14 HISTORY — DX: Unspecified osteoarthritis, unspecified site: M19.90

## 2013-01-14 HISTORY — DX: Reserved for concepts with insufficient information to code with codable children: IMO0002

## 2013-01-14 LAB — COMPREHENSIVE METABOLIC PANEL
AST: 27 U/L (ref 0–37)
Albumin: 3.9 g/dL (ref 3.5–5.2)
BUN: 11 mg/dL (ref 6–23)
Calcium: 9.3 mg/dL (ref 8.4–10.5)
Chloride: 102 mEq/L (ref 96–112)
Creatinine, Ser: 0.66 mg/dL (ref 0.50–1.10)
GFR calc non Af Amer: >90 mL/min (ref >90)
Total Bilirubin: 0.1 mg/dL — ABNORMAL LOW (ref 0.3–1.2)

## 2013-01-14 LAB — CBC WITH DIFFERENTIAL/PLATELET
Basophils Absolute: 0 10*3/uL (ref 0.0–0.1)
Basophils Relative: 1 % (ref 0–1)
Eosinophils Absolute: 0.2 10*3/uL (ref 0.0–0.7)
Eosinophils Relative: 2 % (ref 0–5)
HCT: 33.1 % — ABNORMAL LOW (ref 36.0–46.0)
Hemoglobin: 10.8 g/dL — ABNORMAL LOW (ref 12.0–15.0)
MCH: 28.2 pg (ref 26.0–34.0)
MCHC: 32.6 g/dL (ref 30.0–36.0)
MCV: 86.4 fL (ref 78.0–100.0)
Monocytes Absolute: 0.5 10*3/uL (ref 0.1–1.0)
Monocytes Relative: 6 % (ref 3–12)
Neutro Abs: 4.4 10*3/uL (ref 1.7–7.7)
RDW: 14.5 % (ref 11.5–15.5)

## 2013-01-14 LAB — PREGNANCY, URINE: Preg Test, Ur: NEGATIVE

## 2013-01-14 LAB — URINALYSIS, ROUTINE W REFLEX MICROSCOPIC
Specific Gravity, Urine: 1.011 (ref 1.005–1.030)
Urobilinogen, UA: 0.2 mg/dL (ref 0.0–1.0)
pH: 8 (ref 5.0–8.0)

## 2013-01-14 LAB — WET PREP, GENITAL
Trich, Wet Prep: NONE SEEN
Yeast Wet Prep HPF POC: NONE SEEN

## 2013-01-14 MED ORDER — KETOROLAC TROMETHAMINE 30 MG/ML IJ SOLN
30.0000 mg | Freq: Once | INTRAMUSCULAR | Status: AC
  Administered 2013-01-14: 30 mg via INTRAMUSCULAR
  Filled 2013-01-14: qty 1

## 2013-01-14 NOTE — ED Provider Notes (Signed)
CSN: [id number]     Arrival date & time 01/14/13  1440 History   First MD Initiated Contact with Patient 01/14/13 1602     Chief Complaint  Patient presents with  . Back Pain  . Leg Pain    HPI  Tasha Fernandez is a 42 y.o. female with a PMH of TMJ, arthritis, herniated disc, anxiety, and seasonal allergies who presents to the ED for evaluation of back and leg pain as well as multiple other complaints.  History was provided by the patient.  Patient states that she has had lower back pain for the past 3 days with radiation down her anterior legs bilaterally.  Her pain is diffusely across her lower back with the L = R.  She describes a constant, aching/stabbing/aggrevating/dull pain.  Her pain is worse with laying flat and improved with hydrocodone.  She also feels better when she sits on her legs upright.  She has a history of arthritis and a herniated disc in her neck but is unsure about her back.  She denies any injuries or trauma.  No numbness, tingling, loss of sensation, weakness, nausea, emesis, diarrhea, constipation, dysuria, hematuria, vaginal discharge, fever, or change in appetite/activity.  Patient is currently on her menstrual period for the last 3-4 days.  She has diffuse lower abdominal pain described as a pressure sensation as well.  She states "I just hurt everywhere" and "no one knows what is wrong with me."  She also speaks about her ongoing TMJ pain and post-wisdom tooth extraction last week.     Past Medical History  Diagnosis Date  . Seasonal allergies   . Anxiety   . TMJ (dislocation of temporomandibular joint)   . Arthritis   . Herniated disc    Past Surgical History  Procedure Laterality Date  . Cholecystectomy    . Cesarean section    . Tubal ligation     Family History  Problem Relation Age of Onset  . Bursitis Mother    History  Substance Use Topics  . Smoking status: Never Smoker   . Smokeless tobacco: Never Used  . Alcohol Use: No     Comment: social    OB History   Grav Para Term Preterm Abortions TAB SAB Ect Mult Living                 Review of Systems  Constitutional: Negative for fever, chills, diaphoresis, activity change, appetite change and fatigue.  HENT: Positive for dental problem (s/p wisdom tooth extraction). Negative for congestion, drooling, ear pain, rhinorrhea, sore throat, trouble swallowing and voice change.   Eyes: Negative for visual disturbance.  Respiratory: Negative for cough, shortness of breath and wheezing.   Cardiovascular: Negative for chest pain, palpitations and leg swelling.  Gastrointestinal: Positive for abdominal pain. Negative for nausea, vomiting, diarrhea, constipation, blood in stool and rectal pain.  Genitourinary: Positive for vaginal bleeding (menstual period). Negative for dysuria, hematuria, flank pain, decreased urine volume, vaginal discharge, difficulty urinating, vaginal pain and menstrual problem.  Musculoskeletal: Positive for arthralgias, back pain, myalgias and neck pain (chronic). Negative for gait problem and joint swelling.  Skin: Negative for wound.  Neurological: Negative for dizziness, weakness, light-headedness, numbness and headaches.  Psychiatric/Behavioral: Negative for confusion.    Allergies  Review of patient's allergies indicates no known allergies.  Home Medications   Current Outpatient Rx  Name  Route  Sig  Dispense  Refill  . amoxicillin (AMOXIL) 500 MG capsule   Oral  Take 500 mg by mouth 3 (three) times daily.         Marland Kitchen aspirin-acetaminophen-caffeine (EXCEDRIN MIGRAINE) 250-250-65 MG per tablet   Oral   Take 1 tablet by mouth every 6 (six) hours as needed for pain.         Marland Kitchen HYDROcodone-acetaminophen (NORCO/VICODIN) 5-325 MG per tablet   Oral   Take 1-2 tablets by mouth every 6 (six) hours as needed for moderate pain (pain).         . naproxen sodium (ANAPROX) 220 MG tablet   Oral   Take 220 mg by mouth 2 (two) times daily as needed.           BP 118/72  Pulse 117  Temp(Src) 98.2 F (36.8 C) (Oral)  Resp 20  Ht 5' 5.5" (1.664 m)  Wt 139 lb (63.05 kg)  BMI 22.77 kg/m2  SpO2 100%  LMP 01/14/2013  Filed Vitals:   01/14/13 1456 01/14/13 1701 01/14/13 2125  BP: 118/72 131/73 116/70  Pulse: 117 104 96  Temp: 98.2 F (36.8 C)  97.8 F (36.6 C)  TempSrc: Oral  Oral  Resp: 20 18 16   Height: 5' 5.5" (1.664 m)    Weight: 139 lb (63.05 kg)    SpO2: 100% 100% 99%    Physical Exam  Nursing note and vitals reviewed. Constitutional: She is oriented to person, place, and time. She appears well-developed and well-nourished. No distress.  HENT:  Head: Normocephalic and atraumatic.  Right Ear: External ear normal.  Left Ear: External ear normal.  Nose: Nose normal.  Mouth/Throat: Oropharynx is clear and moist. No oropharyngeal exudate.  No facial edema/tenderness.  No trismus.  Uvula midline.    Eyes: Conjunctivae and EOM are normal. Pupils are equal, round, and reactive to light. Right eye exhibits no discharge. Left eye exhibits no discharge.  Neck: Normal range of motion. Neck supple.  No cervical spinal or paraspinal tenderness to palpation throughout.  No limitations with neck ROM.    Cardiovascular: Normal rate, regular rhythm, normal heart sounds and intact distal pulses.  Exam reveals no gallop and no friction rub.   No murmur heard. Dorsalis pedis pulses present and equal bilaterally.    Pulmonary/Chest: Effort normal and breath sounds normal. No respiratory distress. She has no wheezes. She has no rales. She exhibits no tenderness.  Abdominal: Soft. Bowel sounds are normal. She exhibits no distension and no mass. There is no tenderness. There is no rebound and no guarding.  Musculoskeletal: Normal range of motion. She exhibits tenderness. She exhibits no edema.  Tenderness to palpation to the lumbar paraspinal muscles.  Negative straight leg raise.  No thoracic or lumbar spinal tenderness.  Strength 5/5 in the upper  and lower extremities bilaterally.  Patient able to ambulate without difficulty or ataxia.    Neurological: She is alert and oriented to person, place, and time.  Patellar reflexes intact bilaterally.  Sensation intact in the LE bilaterally  Skin: Skin is warm and dry. She is not diaphoretic.    ED Course  Procedures (including critical care time) Labs Review Labs Reviewed  URINALYSIS, ROUTINE W REFLEX MICROSCOPIC   Imaging Review No results found.  EKG Interpretation   None      Results for orders placed during the hospital encounter of 01/14/13  WET PREP, GENITAL      Result Value Range   Yeast Wet Prep HPF POC NONE SEEN  NONE SEEN   Trich, Wet Prep NONE SEEN  NONE SEEN  Clue Cells Wet Prep HPF POC FEW (*) NONE SEEN   WBC, Wet Prep HPF POC RARE (*) NONE SEEN  GC/CHLAMYDIA PROBE AMP      Result Value Range   CT Probe RNA NEGATIVE  NEGATIVE   GC Probe RNA NEGATIVE  NEGATIVE  URINALYSIS, ROUTINE W REFLEX MICROSCOPIC      Result Value Range   Color, Urine YELLOW  YELLOW   APPearance CLEAR  CLEAR   Specific Gravity, Urine 1.011  1.005 - 1.030   pH 8.0  5.0 - 8.0   Glucose, UA NEGATIVE  NEGATIVE mg/dL   Hgb urine dipstick NEGATIVE  NEGATIVE   Bilirubin Urine NEGATIVE  NEGATIVE   Ketones, ur NEGATIVE  NEGATIVE mg/dL   Protein, ur NEGATIVE  NEGATIVE mg/dL   Urobilinogen, UA 0.2  0.0 - 1.0 mg/dL   Nitrite NEGATIVE  NEGATIVE   Leukocytes, UA NEGATIVE  NEGATIVE  PREGNANCY, URINE      Result Value Range   Preg Test, Ur NEGATIVE  NEGATIVE  CBC WITH DIFFERENTIAL      Result Value Range   WBC 7.8  4.0 - 10.5 K/uL   RBC 3.83 (*) 3.87 - 5.11 MIL/uL   Hemoglobin 10.8 (*) 12.0 - 15.0 g/dL   HCT 16.1 (*) 09.6 - 04.5 %   MCV 86.4  78.0 - 100.0 fL   MCH 28.2  26.0 - 34.0 pg   MCHC 32.6  30.0 - 36.0 g/dL   RDW 40.9  81.1 - 91.4 %   Platelets 322  150 - 400 K/uL   Neutrophils Relative % 56  43 - 77 %   Neutro Abs 4.4  1.7 - 7.7 K/uL   Lymphocytes Relative 35  12 - 46 %    Lymphs Abs 2.7  0.7 - 4.0 K/uL   Monocytes Relative 6  3 - 12 %   Monocytes Absolute 0.5  0.1 - 1.0 K/uL   Eosinophils Relative 2  0 - 5 %   Eosinophils Absolute 0.2  0.0 - 0.7 K/uL   Basophils Relative 1  0 - 1 %   Basophils Absolute 0.0  0.0 - 0.1 K/uL  COMPREHENSIVE METABOLIC PANEL      Result Value Range   Sodium 139  135 - 145 mEq/L   Potassium 4.1  3.5 - 5.1 mEq/L   Chloride 102  96 - 112 mEq/L   CO2 26  19 - 32 mEq/L   Glucose, Bld 101 (*) 70 - 99 mg/dL   BUN 11  6 - 23 mg/dL   Creatinine, Ser 7.82  0.50 - 1.10 mg/dL   Calcium 9.3  8.4 - 95.6 mg/dL   Total Protein 7.5  6.0 - 8.3 g/dL   Albumin 3.9  3.5 - 5.2 g/dL   AST 27  0 - 37 U/L   ALT 37 (*) 0 - 35 U/L   Alkaline Phosphatase 61  39 - 117 U/L   Total Bilirubin 0.1 (*) 0.3 - 1.2 mg/dL   GFR calc non Af Amer >90  >90 mL/min   GFR calc Af Amer >90  >90 mL/min    MDM   WISDOM SEYBOLD is a 42 y.o. female  with a PMH of TMJ, arthritis, herniated disc, anxiety, and seasonal allergies who presents to the ED for evaluation of back and leg pain as well as multiple other complaints.     Rechecks  8:20 PM = Pelvic exam performed at bedside.  Moderate amount of vaginal bleeding present  in the vaginal vault.  No discharge.  No CMT or adnexal tenderness bilaterally.   9:16 PM = Patient states nothing works for her.  Toradol did not improve her pain.  She states she needs injections for her pain everywhere.  Patient states she has been evaluated for MS and other causes of her chronic pain and no one knows anything.  She doesn't want narcotics for her pain because she is worried it will affect her kidneys and liver.  Spoke with patient about getting pain management.       Patient evaluated in the ED for multiple complaints.  Her main complaint was back, leg and abdominal pain. Etiology of back and abdominal pain possibly due to patient's menstrual period.  No evidence of a UTI at this time.  Patient's abdominal exam was benign and  non-surgical.  Labs unremarkable.  Back pain may also be musculoskeletal in nature.  No hx of trauma. Patient neurovascularly intact and has no difficulty with ambulation.  Patient instructed to follow-up with her PCP for continued evaluation and management.  Patient in agreement with discharge and plan.     Discharge Medication List as of 01/14/2013  9:34 PM      Final impressions: 1. Back pain   2. Leg pain, bilateral       Luiz Iron PA-C   This patient was discussed with Dr.Jacubowitz         Jillyn Ledger, PA-C 01/16/13 1623

## 2013-01-14 NOTE — ED Notes (Addendum)
Patient c/o low back pain that radiates into both legs, hips and thighs. Patient denies urinary frequency, dysuria, vaginal discharge. Patient states she was prescribed oxycodone and amoxicillin when she had oral surgery on 01/10/13. Patient states oxycodone only works for a short time. Patient also states she was taking Flexeril for her TMJ, but has not been taking it.

## 2013-01-15 LAB — GC/CHLAMYDIA PROBE AMP: CT Probe RNA: NEGATIVE

## 2013-01-21 NOTE — ED Provider Notes (Signed)
Medical screening examination/treatment/procedure(s) were performed by non-physician practitioner and as supervising physician I was immediately available for consultation/collaboration.  EKG Interpretation   None        Doug Sou, MD 01/21/13 1454

## 2013-02-07 ENCOUNTER — Other Ambulatory Visit: Payer: Self-pay | Admitting: Family Medicine

## 2013-06-18 ENCOUNTER — Encounter (HOSPITAL_COMMUNITY): Payer: Self-pay | Admitting: Emergency Medicine

## 2013-06-18 ENCOUNTER — Emergency Department (HOSPITAL_COMMUNITY)
Admission: EM | Admit: 2013-06-18 | Discharge: 2013-06-19 | Disposition: A | Discharge status: Home / Self Care | Payer: Medicaid Other | Attending: Emergency Medicine | Admitting: Emergency Medicine

## 2013-06-18 DIAGNOSIS — M26609 Unspecified temporomandibular joint disorder, unspecified side: Secondary | ICD-10-CM | POA: Insufficient documentation

## 2013-06-18 DIAGNOSIS — G500 Trigeminal neuralgia: Secondary | ICD-10-CM | POA: Insufficient documentation

## 2013-06-18 DIAGNOSIS — M129 Arthropathy, unspecified: Secondary | ICD-10-CM | POA: Insufficient documentation

## 2013-06-18 DIAGNOSIS — Z79899 Other long term (current) drug therapy: Secondary | ICD-10-CM | POA: Insufficient documentation

## 2013-06-18 DIAGNOSIS — R51 Headache: Secondary | ICD-10-CM | POA: Insufficient documentation

## 2013-06-18 DIAGNOSIS — R519 Headache, unspecified: Secondary | ICD-10-CM

## 2013-06-18 DIAGNOSIS — F411 Generalized anxiety disorder: Secondary | ICD-10-CM | POA: Insufficient documentation

## 2013-06-18 DIAGNOSIS — Z7982 Long term (current) use of aspirin: Secondary | ICD-10-CM | POA: Insufficient documentation

## 2013-06-18 DIAGNOSIS — G241 Genetic torsion dystonia: Secondary | ICD-10-CM | POA: Insufficient documentation

## 2013-06-18 DIAGNOSIS — M542 Cervicalgia: Secondary | ICD-10-CM | POA: Insufficient documentation

## 2013-06-18 DIAGNOSIS — G8929 Other chronic pain: Secondary | ICD-10-CM | POA: Insufficient documentation

## 2013-06-18 NOTE — ED Notes (Signed)
Per EMS: Pt took 2 goody powders at home for headache, TMJ, and pinched nerve in neck. Pt went to chiropracter earlier today. After taking goody powders pt started feeling numbness in L arm. Pt very anxious. A&O x 4.

## 2013-06-18 NOTE — ED Notes (Signed)
Pt reports pain in her neck, jaws and face.  Pt reports chronic pain.  Has seen her doctor for this.  Pt reports these pain has been going on for 4 years.  Pt reports she has taken OTC pain meds tonight without relief.

## 2013-06-19 MED ORDER — ONDANSETRON 8 MG PO TBDP
8.0000 mg | ORAL_TABLET | Freq: Once | ORAL | Status: AC
  Administered 2013-06-19: 8 mg via ORAL
  Filled 2013-06-19: qty 1

## 2013-06-19 MED ORDER — HYDROMORPHONE HCL PF 1 MG/ML IJ SOLN
1.0000 mg | Freq: Once | INTRAMUSCULAR | Status: AC
  Administered 2013-06-19: 1 mg via INTRAMUSCULAR
  Filled 2013-06-19: qty 1

## 2013-06-19 MED ORDER — HYDROCODONE-ACETAMINOPHEN 5-325 MG PO TABS: 1.0000 | ORAL_TABLET | ORAL | Status: DC | PRN

## 2013-06-19 NOTE — ED Provider Notes (Signed)
CSN: [id number]     Arrival date & time 06/18/13  2238 History   First MD Initiated Contact with Patient 06/18/13 2349     Chief Complaint  Patient presents with  . Multiple Complaints      (Consider location/radiation/quality/duration/timing/severity/associated sxs/prior Treatment) HPI History provided by pt and prior chart.  Pt presents w/ chronic, diffuse, pressure/burning facial, head and neck pain.  Has had this pain constantly for several years.  Has been evaluated by ENT and oral surgery and is followed by neurology and pain management [facility].  Has been diagnosed w/ TMJ, trigeminal neuralgia, migraines and orofacial dystonia.  Has taken tegretol, flexeril and NSAIDs and received nerve blocks out relief.  Per her headache specialist's most recent note on 06/06/13, there is some concern for underlying psychiatric component to pain and she has instructed patient to work on stress management.  She received bilateral greater occipital nerve block at that visit and will follow up with her again in July.  She also has an appointment with diagnostic neurology next month.  Pt reports that current pain is typical in nature.  No recent trauma.  Denies fever, dizziness, vision changes, extremity weakness/paresthesias.  Has chronic intermittent and stable dysphagia.    Past Medical History  Diagnosis Date  . Seasonal allergies   . Anxiety   . TMJ (dislocation of temporomandibular joint)   . Arthritis   . Herniated disc    Past Surgical History  Procedure Laterality Date  . Cholecystectomy    . Cesarean section    . Tubal ligation     Family History  Problem Relation Age of Onset  . Bursitis Mother    History  Substance Use Topics  . Smoking status: Never Smoker   . Smokeless tobacco: Never Used  . Alcohol Use: No     Comment: social   OB History   Grav Para Term Preterm Abortions TAB SAB Ect Mult Living                 Review of Systems  All other systems reviewed and  are negative.     Allergies  Review of patient's allergies indicates no known allergies.  Home Medications   Prior to Admission medications   Medication Sig Start Date End Date Taking? Authorizing Provider  Aspirin-Acetaminophen (GOODYS BODY PAIN PO) Take 1 packet by mouth daily as needed (pain).   Yes Historical Provider, MD  aspirin-acetaminophen-caffeine (EXCEDRIN MIGRAINE) 941-112-9659250-250-65 MG per tablet Take 1 tablet by mouth every 6 (six) hours as needed for pain.   Yes Historical Provider, MD  CarBAMazepine (TEGRETOL PO) Take 1 tablet by mouth 2 (two) times daily.   Yes Historical Provider, MD  cyclobenzaprine (FLEXERIL) 10 MG tablet Take 10 mg by mouth 3 (three) times daily as needed. Muscle spasms 04/30/13  Yes Historical Provider, MD  HYDROcodone-acetaminophen (NORCO/VICODIN) 5-325 MG per tablet Take 1-2 tablets by mouth every 6 (six) hours as needed for moderate pain (pain).   Yes Historical Provider, MD  ibuprofen (ADVIL,MOTRIN) 200 MG tablet Take 400 mg by mouth every 6 (six) hours as needed for moderate pain.   Yes Historical Provider, MD  OnabotulinumtoxinA (BOTOX IJ) Inject as directed. Injection given at [facility] every 3 months   Yes Historical Provider, MD  venlafaxine XR (EFFEXOR-XR) 150 MG 24 hr capsule Take 150 mg by mouth daily. 05/16/13 05/16/14 Yes Historical Provider, MD   BP 104/72  Pulse 103  Temp(Src) 97.7 F (36.5 C) (Oral)  Resp  16  SpO2 100%  LMP 06/11/2013 Physical Exam  Nursing note and vitals reviewed. Constitutional: She is oriented to person, place, and time. She appears well-developed and well-nourished. No distress.  HENT:  Head: Normocephalic and atraumatic.  Bilateral TMJ and temporal tenderness.  Full ROM of jaw; no trismus.  Nml posterior pharynx and trismus.    Eyes:  Normal appearance  Neck: Normal range of motion. Neck supple. No tracheal deviation present. No thyromegaly present.  Diffuse tenderness of anterior and posterior neck   Cardiovascular: Normal rate, regular rhythm and intact distal pulses.   Pulmonary/Chest: Effort normal and breath sounds normal.  Musculoskeletal: Normal range of motion.  Lymphadenopathy:    She has no cervical adenopathy.  Neurological: She is alert and oriented to person, place, and time. No sensory deficit. Coordination normal.  CN 3-12 intact.  No nystagmus. 5/5 and equal upper and lower extremity strength.  No past pointing.     Skin: Skin is warm and dry. No rash noted.  Psychiatric: She has a normal mood and affect. Her behavior is normal.    ED Course  Procedures (including critical care time) Labs Review Labs Reviewed - No data to display  Imaging Review No results found.   EKG Interpretation None      MDM   Final diagnoses:  Chronic facial pain   Pt presents w/ chronic and stable, diffuse facial, head and neck pain.  Onset 4 years ago.  Has been evaluated by several specialist and is [facility].  Has been diagnosed w/ TMJ, migraines, trigeminal neuralgia and orofacial dystonia and it is thought that there is an underlying psychiatric component to pain as well.  Has not had relief w/ recent nerve blocks nor any medications prescribed in the past.  On exam today, afebrile, uncomfortable appearing, no skin changes, tenderness of temples and bilateral TMJ but otherwise unremarkable HEENT, diffuse neck tenderness, no focal neuro deficits.  I explained to patient that there is nothing that we do for her in the ER tonight other than administer a dose of pain medication.  Treated w/ 1mg  IM dilaudid and d/c'd home.  Advised f/u w/ her neurologist and PCP.  Return precautions discussed.      Arie Sabinaatherine E Nasario Czerniak, PA-C 06/19/13 0200

## 2013-06-19 NOTE — Discharge Instructions (Signed)
Take vicodin as prescribed for severe pain.  Do not drive within four hours of taking this medication (may cause drowsiness or confusion).   Follow up with your neurologist as scheduled.   You may return to the ER if symptoms worsen or you have any other concerns.

## 2013-06-20 NOTE — ED Provider Notes (Signed)
Medical screening examination/treatment/procedure(s) were performed by non-physician practitioner and as supervising physician I was immediately available for consultation/collaboration.   EKG Interpretation None       Donnetta HutchingBrian Dannika Hilgeman, MD 06/20/13 [phone]

## 2013-12-22 IMAGING — US US PELVIS COMPLETE
1 series · 14 of 25 positions shown · non-contrast
Comparison: None

CLINICAL DATA: Metrorrhagia.  LMP 04/03/2012



[Series 1: us pelvis complete · 0.22mm/px · 14 of 66 slices shown]
[im 1/66]
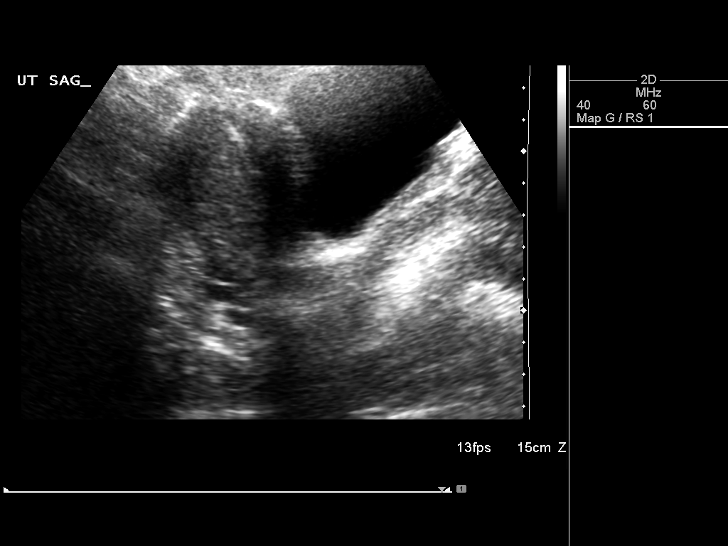
[im 6/66]
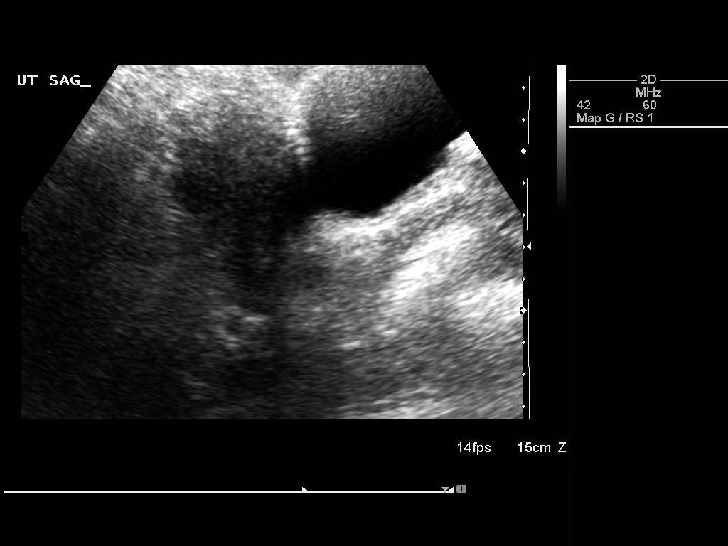
[im 11/66]
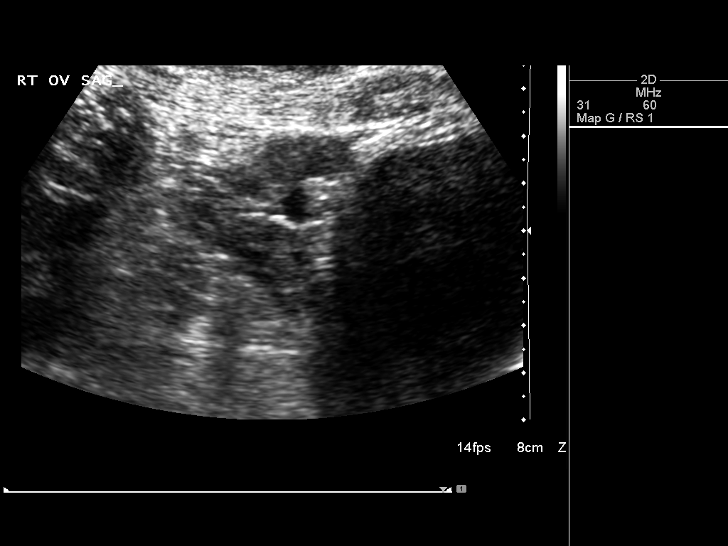
[im 17/66]
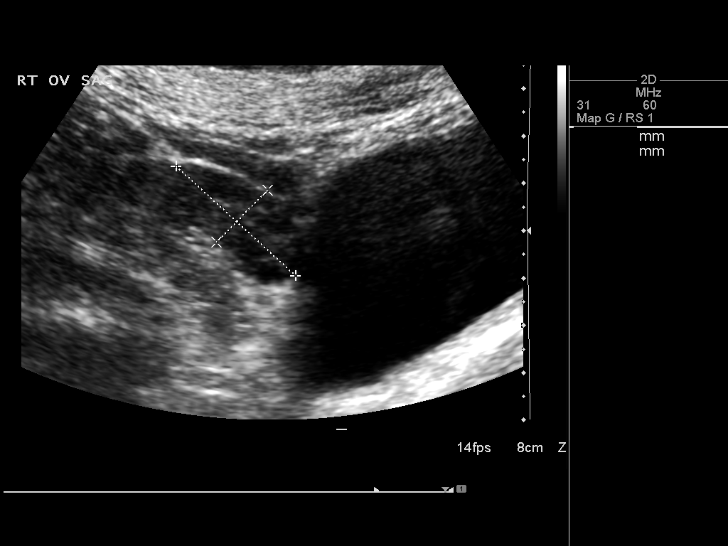
[im 22/66]
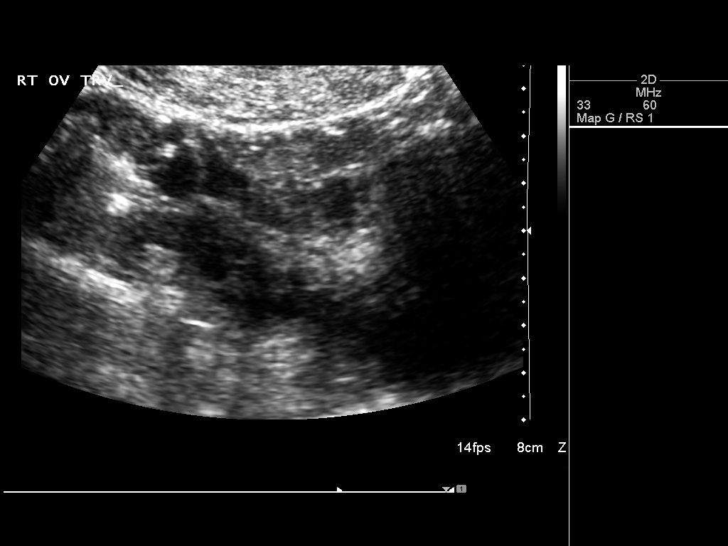
[im 25/66]
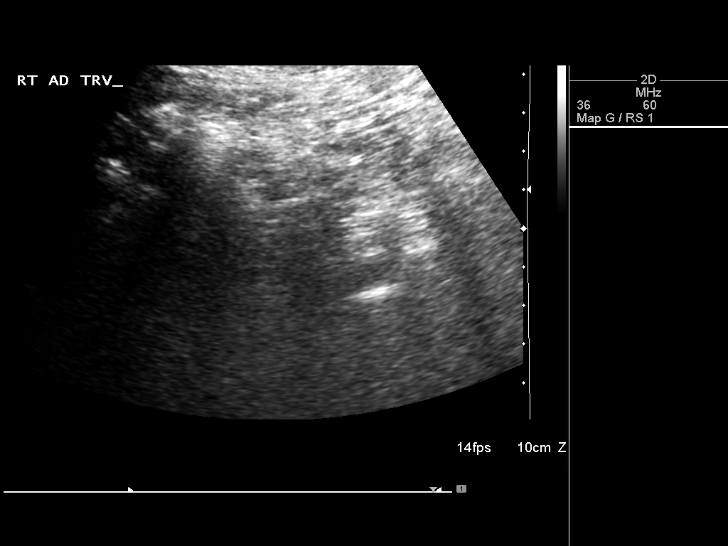
[im 30/66]
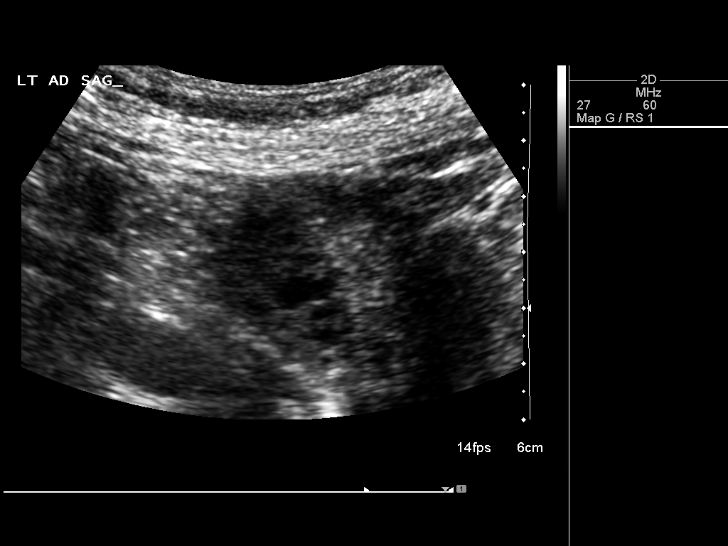
[im 36/66]
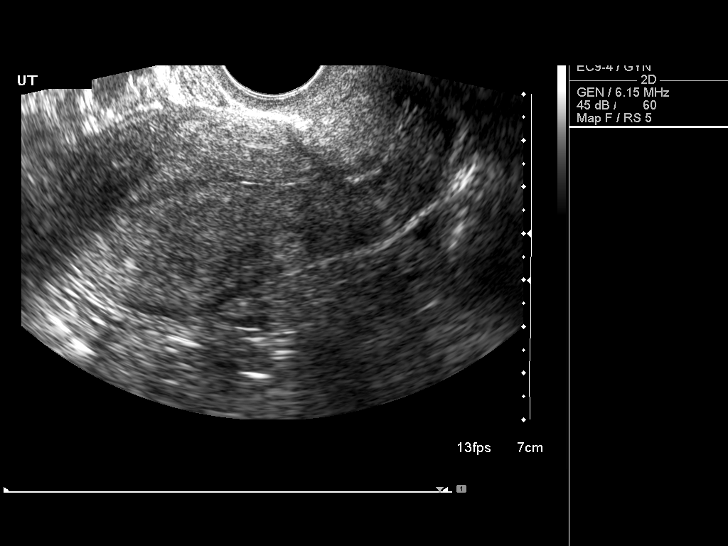
[im 41/66]
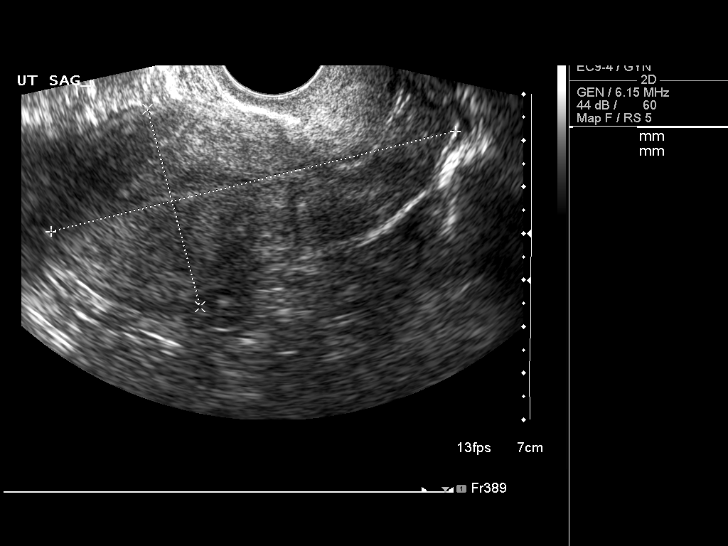
[im 44/66]
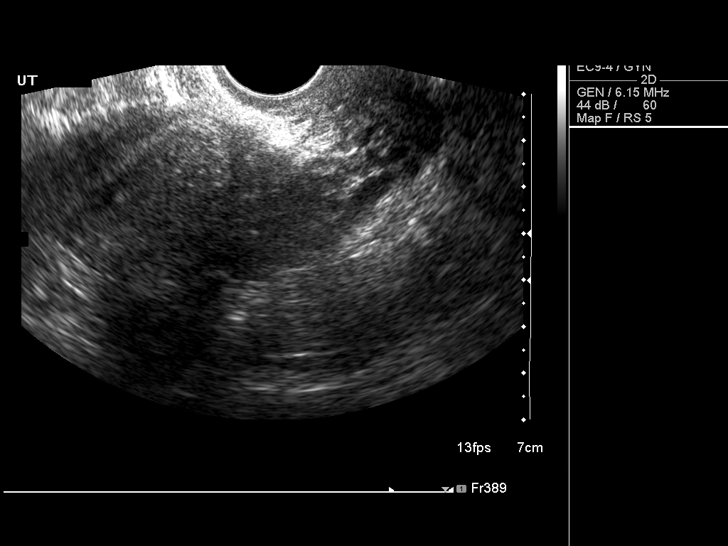
[im 49/66]
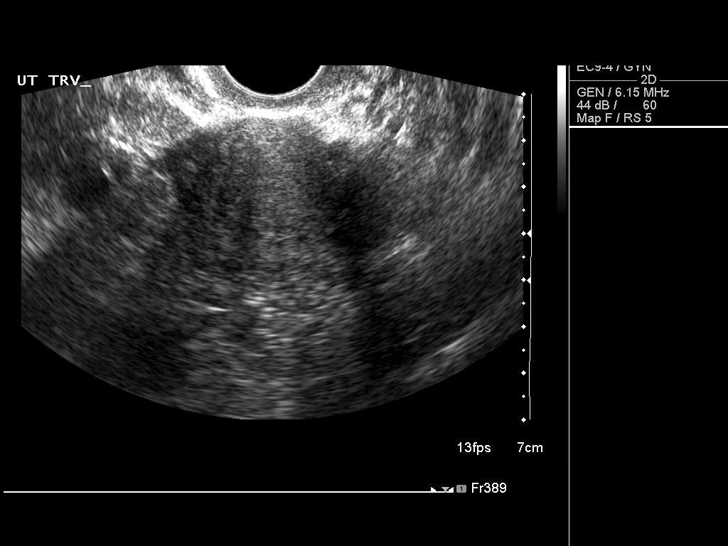
[im 55/66]
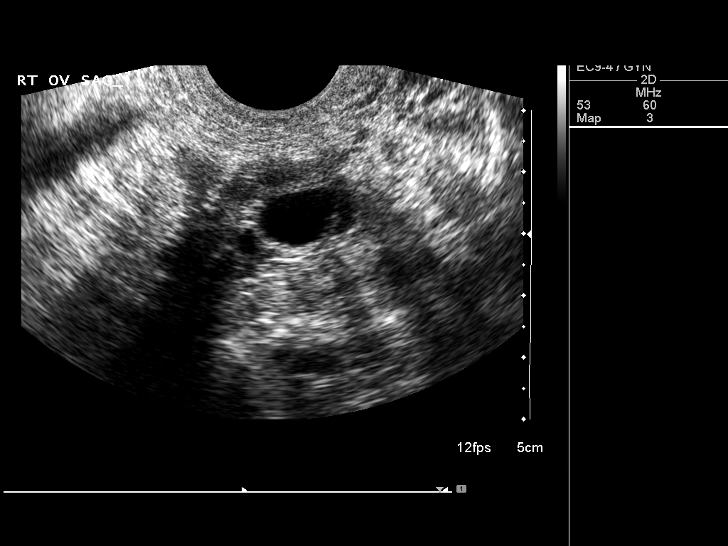
[im 60/66]
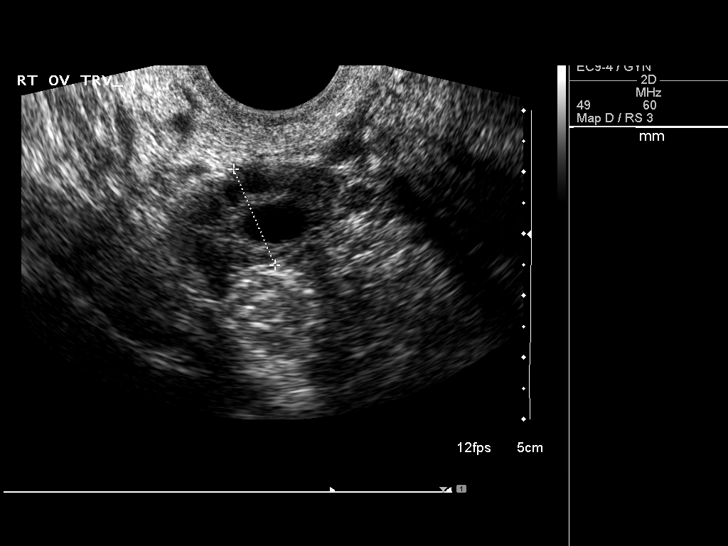
[im 66/66]
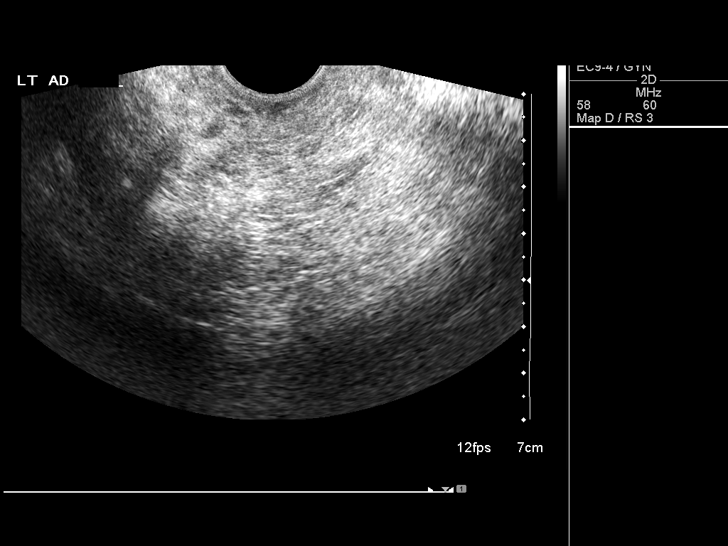

[14 of 25 positions shown; findings below may reference images not displayed]

FINDINGS: Uterus: Is anteverted and anteflexed and demonstrates a sagittal
length of 9 cm, depth of 4.4 cm and width of 4.9 cm.  The
myometrium is mildly heterogeneous with no focal abnormality
identified.

Endometrium: Is thin and echogenic with a width of 4.4 mm.  No
areas of focal thickening or heterogeneity are seen

Right ovary:  Has a normal appearance measuring 1.7 by 3.4 x
cm.

Left ovary: Is well seen only transabdominally and has a normal
appearance measuring 3.5 x 1.9 x 1.9 cm.

Other findings: No pelvic fluid or separate adnexal masses are
seen.
IMPRESSION: Normal pelvic ultrasound.

## 2021-03-14 ENCOUNTER — Ambulatory Visit
Admission: EM | Admit: 2021-03-14 | Discharge: 2021-03-14 | Disposition: A | Discharge status: Home / Self Care | Payer: Self-pay

## 2021-03-14 ENCOUNTER — Emergency Department (HOSPITAL_COMMUNITY): Payer: Medicaid Other

## 2021-03-14 ENCOUNTER — Encounter (HOSPITAL_COMMUNITY): Payer: Self-pay

## 2021-03-14 ENCOUNTER — Other Ambulatory Visit: Payer: Self-pay

## 2021-03-14 ENCOUNTER — Emergency Department (HOSPITAL_COMMUNITY)
Admission: EM | Admit: 2021-03-14 | Discharge: 2021-03-14 | Disposition: A | Discharge status: Home / Self Care | Payer: Medicaid Other | Attending: Emergency Medicine | Admitting: Emergency Medicine

## 2021-03-14 DIAGNOSIS — N390 Urinary tract infection, site not specified: Secondary | ICD-10-CM | POA: Diagnosis not present

## 2021-03-14 DIAGNOSIS — Z7982 Long term (current) use of aspirin: Secondary | ICD-10-CM | POA: Diagnosis not present

## 2021-03-14 DIAGNOSIS — K219 Gastro-esophageal reflux disease without esophagitis: Secondary | ICD-10-CM

## 2021-03-14 DIAGNOSIS — K2100 Gastro-esophageal reflux disease with esophagitis, without bleeding: Secondary | ICD-10-CM | POA: Diagnosis not present

## 2021-03-14 DIAGNOSIS — R1013 Epigastric pain: Secondary | ICD-10-CM | POA: Diagnosis present

## 2021-03-14 LAB — COMPREHENSIVE METABOLIC PANEL
ALT: 26 U/L (ref 0–44)
AST: 19 U/L (ref 15–41)
Albumin: 3.3 g/dL — ABNORMAL LOW (ref 3.5–5.0)
Alkaline Phosphatase: 79 U/L (ref 38–126)
Anion gap: 11 (ref 5–15)
BUN: 13 mg/dL (ref 6–20)
CO2: 25 mmol/L (ref 22–32)
Calcium: 8.6 mg/dL — ABNORMAL LOW (ref 8.9–10.3)
Chloride: 100 mmol/L (ref 98–111)
Creatinine, Ser: 0.75 mg/dL (ref 0.44–1.00)
GFR, Estimated: >60 mL/min (ref >60)
Glucose, Bld: 105 mg/dL — ABNORMAL HIGH (ref 70–99)
Potassium: 4.3 mmol/L (ref 3.5–5.1)
Sodium: 136 mmol/L (ref 135–145)
Total Bilirubin: 1.2 mg/dL (ref 0.3–1.2)
Total Protein: 7.7 g/dL (ref 6.5–8.1)

## 2021-03-14 LAB — CBC WITH DIFFERENTIAL/PLATELET
Abs Immature Granulocytes: 0.16 10*3/uL — ABNORMAL HIGH (ref 0.00–0.07)
Basophils Absolute: 0.1 10*3/uL (ref 0.0–0.1)
Basophils Relative: 1 %
Eosinophils Absolute: 0 10*3/uL (ref 0.0–0.5)
Eosinophils Relative: 0 %
HCT: 42.8 % (ref 36.0–46.0)
Hemoglobin: 13.5 g/dL (ref 12.0–15.0)
Immature Granulocytes: 1 %
Lymphocytes Relative: 13 %
Lymphs Abs: 2.1 10*3/uL (ref 0.7–4.0)
MCH: 30.2 pg (ref 26.0–34.0)
MCHC: 31.5 g/dL (ref 30.0–36.0)
MCV: 95.7 fL (ref 80.0–100.0)
Monocytes Absolute: 1 10*3/uL (ref 0.1–1.0)
Monocytes Relative: 6 %
Neutro Abs: 12.6 10*3/uL — ABNORMAL HIGH (ref 1.7–7.7)
Neutrophils Relative %: 79 %
Platelets: 483 10*3/uL — ABNORMAL HIGH (ref 150–400)
RBC: 4.47 MIL/uL (ref 3.87–5.11)
RDW: 12.6 % (ref 11.5–15.5)
WBC: 16 10*3/uL — ABNORMAL HIGH (ref 4.0–10.5)
nRBC: 0 % (ref 0.0–0.2)

## 2021-03-14 LAB — URINALYSIS, ROUTINE W REFLEX MICROSCOPIC
Bilirubin Urine: NEGATIVE
Glucose, UA: NEGATIVE mg/dL
Ketones, ur: NEGATIVE mg/dL
Nitrite: POSITIVE — AB
Protein, ur: NEGATIVE mg/dL
Specific Gravity, Urine: 1.014 (ref 1.005–1.030)
pH: 6 (ref 5.0–8.0)

## 2021-03-14 LAB — RAPID URINE DRUG SCREEN, HOSP PERFORMED
Amphetamines: NOT DETECTED
Barbiturates: NOT DETECTED
Benzodiazepines: NOT DETECTED
Cocaine: NOT DETECTED
Opiates: POSITIVE — AB
Tetrahydrocannabinol: NOT DETECTED

## 2021-03-14 LAB — LIPASE, BLOOD: Lipase: 25 U/L (ref 11–51)

## 2021-03-14 MED ORDER — CEPHALEXIN 500 MG PO CAPS: 500.0000 mg | ORAL_CAPSULE | Freq: Four times a day (QID) | ORAL | 0 refills | Status: DC

## 2021-03-14 MED ORDER — ONDANSETRON 8 MG PO TBDP: 8.0000 mg | ORAL_TABLET | Freq: Three times a day (TID) | ORAL | 0 refills | Status: DC | PRN

## 2021-03-14 MED ORDER — LACTATED RINGERS IV SOLN: INTRAVENOUS | Status: DC

## 2021-03-14 MED ORDER — LACTATED RINGERS IV BOLUS
1000.0000 mL | Freq: Once | INTRAVENOUS | Status: AC
  Administered 2021-03-14: 1000 mL via INTRAVENOUS

## 2021-03-14 MED ORDER — METOCLOPRAMIDE HCL 5 MG/ML IJ SOLN
5.0000 mg | Freq: Once | INTRAMUSCULAR | Status: AC
  Administered 2021-03-14: 5 mg via INTRAVENOUS
  Filled 2021-03-14: qty 2

## 2021-03-14 MED ORDER — PANTOPRAZOLE SODIUM 20 MG PO TBEC: 20.0000 mg | DELAYED_RELEASE_TABLET | Freq: Two times a day (BID) | ORAL | 0 refills | Status: AC

## 2021-03-14 NOTE — ED Provider Notes (Signed)
[facility] DEPT Provider Note   CSN: [id number] Arrival date & time: 03/14/21  1229     History  Chief Complaint  Patient presents with   Emesis    Tasha Fernandez is a 51 y.o. female.  51 year old female presents with several day history of recurrent emesis.  Patient states that when she tries to eat it becomes worse.  Bilious has been yellow at times but without blood.  Endorses/epigastric discomfort.  Does have remote history of alcohol and marijuana use but denies any currently.  No fever or chills.  Has been using Pepto-Bismol without relief.  States has had this before in the past due to excessive use of NSAIDs.  Denies any current use at this time.  No black or bloody stool.      Home Medications Prior to Admission medications   Medication Sig Start Date End Date Taking? Authorizing Provider  Aspirin-Acetaminophen (GOODYS BODY PAIN PO) Take 1 packet by mouth daily as needed (pain).    [provider]  aspirin-acetaminophen-caffeine (EXCEDRIN MIGRAINE) [id number] MG per tablet Take 1 tablet by mouth every 6 (six) hours as needed for pain.    [provider]  CarBAMazepine (TEGRETOL PO) Take 1 tablet by mouth 2 (two) times daily.    [provider]  cyclobenzaprine (FLEXERIL) 10 MG tablet Take 10 mg by mouth 3 (three) times daily as needed. Muscle spasms 04/30/13   [provider]  HYDROcodone-acetaminophen (NORCO/VICODIN) 5-325 MG per tablet Take 1-2 tablets by mouth every 6 (six) hours as needed for moderate pain (pain).    [provider]  HYDROcodone-acetaminophen (NORCO/VICODIN) 5-325 MG per tablet Take 1 tablet by mouth every 4 (four) hours as needed for moderate pain. 06/19/13   Schinlever, Barnetta Chapel, PA-C  ibuprofen (ADVIL,MOTRIN) 200 MG tablet Take 400 mg by mouth every 6 (six) hours as needed for moderate pain.    [provider]  OnabotulinumtoxinA (BOTOX IJ) Inject as directed.  Injection given at [facility] every 3 months    [provider]  venlafaxine XR (EFFEXOR-XR) 150 MG 24 hr capsule Take 150 mg by mouth daily. 05/16/13 05/16/14  [provider]  montelukast (SINGULAIR) 10 MG tablet Take 1 tablet (10 mg total) by mouth daily. 07/02/10 04/21/11  Alveda Reasons, MD      Allergies    Dilaudid [hydromorphone] and Other    Review of Systems   Review of Systems  All other systems reviewed and are negative.  Physical Exam Updated Vital Signs BP (!) 153/99 (BP Location: Left Arm)    Pulse 96    Temp 97.6 F (36.4 C) (Oral)    Resp 16    Ht 1.664 m (5' 5.5")    Wt 65.8 kg    LMP 06/11/2013    SpO2 100%    BMI 23.76 kg/m  Physical Exam Vitals and nursing note reviewed.  Constitutional:      General: She is not in acute distress.    Appearance: Normal appearance. She is well-developed. She is not toxic-appearing.  HENT:     Head: Normocephalic and atraumatic.  Eyes:     General: Lids are normal.     Conjunctiva/sclera: Conjunctivae normal.     Pupils: Pupils are equal, round, and reactive to light.  Neck:     Thyroid: No thyroid mass.     Trachea: No tracheal deviation.  Cardiovascular:     Rate and Rhythm: Normal rate and regular rhythm.  Heart sounds: Normal heart sounds. No murmur heard.   No gallop.  Pulmonary:     Effort: Pulmonary effort is normal. No respiratory distress.     Breath sounds: Normal breath sounds. No stridor. No decreased breath sounds, wheezing, rhonchi or rales.  Abdominal:     General: There is no distension.     Palpations: Abdomen is soft.     Tenderness: There is abdominal tenderness in the epigastric area. There is no guarding or rebound.    Musculoskeletal:        General: No tenderness. Normal range of motion.     Cervical back: Normal range of motion and neck supple.  Skin:    General: Skin is warm and dry.     Findings: No abrasion or rash.  Neurological:     Mental Status: She is  alert and oriented to person, place, and time. Mental status is at baseline.     GCS: GCS eye subscore is 4. GCS verbal subscore is 5. GCS motor subscore is 6.     Cranial Nerves: No cranial nerve deficit.     Sensory: No sensory deficit.     Motor: Motor function is intact.  Psychiatric:        Attention and Perception: Attention normal.        Speech: Speech normal.        Behavior: Behavior normal.    ED Results / Procedures / Treatments   Labs (all labs ordered are listed, but only abnormal results are displayed) Labs Reviewed  CBC WITH DIFFERENTIAL/PLATELET - Abnormal; Notable for the following components:      Result Value   WBC 16.0 (*)    Platelets 483 (*)    Neutro Abs 12.6 (*)    Abs Immature Granulocytes 0.16 (*)    All other components within normal limits  COMPREHENSIVE METABOLIC PANEL  LIPASE, BLOOD  URINALYSIS, ROUTINE W REFLEX MICROSCOPIC  RAPID URINE DRUG SCREEN, HOSP PERFORMED    EKG None  Radiology No results found.  Procedures Procedures    Medications Ordered in ED Medications  lactated ringers infusion (has no administration in time range)  metoCLOPramide (REGLAN) injection 5 mg (has no administration in time range)  lactated ringers bolus 1,000 mL (1,000 mLs Intravenous New Bag/Given 03/14/21 1319)    ED Course/ Medical Decision Making/ A&P                           Medical Decision Making Amount and/or Complexity of Data Reviewed Labs: ordered. Radiology: ordered.  Risk Prescription drug management.   Patient presented with emesis x10 days.  Has also complained of some dysuria.  Her urinalysis is consistent with infection.  It is not consistent with severe dehydration.  Electrolytes confirm this.  Patient denies any use of opiates but her UDS is positive for opiates.  Had mild leukocytosis on her CBC and abdominal CT did not show any acute intra-abdominal process.  No evidence of pancreatitis his lipase is normal.  Her abdomen is  nonsurgical.  Was given IV hydration as well as antiemetics and has had no emesis here.  Plan will be to place patient on antibiotics for UTI as well as for Protonix for her GERD.  Patient has no evidence of pyelonephritis at this time she has no fever or flank pain.  No indication for admission        Final Clinical Impression(s) / ED Diagnoses Final diagnoses:  None  Rx / DC Orders ED Discharge Orders     None         Lacretia Leigh, MD 03/14/21 1446

## 2021-03-14 NOTE — ED Triage Notes (Addendum)
Patient reports that she has been vomiting x 10 days. Patient states that she had diarrhea x 2 days then it stopped.Patient denies any abdominal pain.   Patient added at the end of triage that she had a foul odor to her urine

## 2021-12-04 ENCOUNTER — Other Ambulatory Visit: Payer: Self-pay

## 2021-12-04 ENCOUNTER — Emergency Department (HOSPITAL_COMMUNITY)
Admission: EM | Admit: 2021-12-04 | Discharge: 2021-12-04 | Discharge status: Against Medical Advice | Payer: Medicaid Other | Attending: Emergency Medicine | Admitting: Emergency Medicine

## 2021-12-04 ENCOUNTER — Encounter (HOSPITAL_COMMUNITY): Payer: Self-pay

## 2021-12-04 DIAGNOSIS — Z5321 Procedure and treatment not carried out due to patient leaving prior to being seen by health care provider: Secondary | ICD-10-CM | POA: Diagnosis not present

## 2021-12-04 DIAGNOSIS — R3915 Urgency of urination: Secondary | ICD-10-CM | POA: Insufficient documentation

## 2021-12-04 DIAGNOSIS — N939 Abnormal uterine and vaginal bleeding, unspecified: Secondary | ICD-10-CM | POA: Diagnosis not present

## 2021-12-04 DIAGNOSIS — R350 Frequency of micturition: Secondary | ICD-10-CM | POA: Diagnosis not present

## 2021-12-04 DIAGNOSIS — R109 Unspecified abdominal pain: Secondary | ICD-10-CM | POA: Diagnosis present

## 2021-12-04 LAB — COMPREHENSIVE METABOLIC PANEL
ALT: 22 U/L (ref 0–44)
AST: 21 U/L (ref 15–41)
Albumin: 3.7 g/dL (ref 3.5–5.0)
Alkaline Phosphatase: 47 U/L (ref 38–126)
Anion gap: 4 — ABNORMAL LOW (ref 5–15)
BUN: 20 mg/dL (ref 6–20)
CO2: 29 mmol/L (ref 22–32)
Calcium: 8.9 mg/dL (ref 8.9–10.3)
Chloride: 106 mmol/L (ref 98–111)
Creatinine, Ser: 0.79 mg/dL (ref 0.44–1.00)
GFR, Estimated: >60 mL/min (ref >60)
Glucose, Bld: 110 mg/dL — ABNORMAL HIGH (ref 70–99)
Potassium: 4.9 mmol/L (ref 3.5–5.1)
Sodium: 139 mmol/L (ref 135–145)
Total Bilirubin: 0.9 mg/dL (ref 0.3–1.2)
Total Protein: 6.6 g/dL (ref 6.5–8.1)

## 2021-12-04 LAB — CBC WITH DIFFERENTIAL/PLATELET
Abs Immature Granulocytes: 0.02 10*3/uL (ref 0.00–0.07)
Basophils Absolute: 0.1 10*3/uL (ref 0.0–0.1)
Basophils Relative: 1 %
Eosinophils Absolute: 0.2 10*3/uL (ref 0.0–0.5)
Eosinophils Relative: 3 %
HCT: 40.8 % (ref 36.0–46.0)
Hemoglobin: 13.3 g/dL (ref 12.0–15.0)
Immature Granulocytes: 0 %
Lymphocytes Relative: 37 %
Lymphs Abs: 2.6 10*3/uL (ref 0.7–4.0)
MCH: 30.6 pg (ref 26.0–34.0)
MCHC: 32.6 g/dL (ref 30.0–36.0)
MCV: 93.8 fL (ref 80.0–100.0)
Monocytes Absolute: 0.5 10*3/uL (ref 0.1–1.0)
Monocytes Relative: 7 %
Neutro Abs: 3.7 10*3/uL (ref 1.7–7.7)
Neutrophils Relative %: 52 %
Platelets: 279 10*3/uL (ref 150–400)
RBC: 4.35 MIL/uL (ref 3.87–5.11)
RDW: 12.5 % (ref 11.5–15.5)
WBC: 7.1 10*3/uL (ref 4.0–10.5)
nRBC: 0 % (ref 0.0–0.2)

## 2021-12-04 LAB — LIPASE, BLOOD: Lipase: 31 U/L (ref 11–51)

## 2021-12-04 LAB — HCG, SERUM, QUALITATIVE: Preg, Serum: NEGATIVE

## 2021-12-04 NOTE — ED Provider Triage Note (Signed)
Emergency Medicine Provider Triage Evaluation Note  Tasha Fernandez , a 51 y.o. female  was evaluated in triage.  Pt complains of abdominal cramping/pressure. Has associated vaginal spotting, malodorous urine, frequency, urgency.  Review of Systems  Positive:  Negative:   Physical Exam  BP (!) 148/107 (BP Location: Right Arm)   Pulse 77   Temp 98.5 F (36.9 C) (Oral)   Resp 20   LMP 06/11/2013   SpO2 100%  Gen:   Awake, no distress   Resp:  Normal effort  MSK:   Moves extremities without difficulty  Other:    Medical Decision Making  Medically screening exam initiated at 1:07 PM.  Appropriate orders placed.  Tasha Fernandez was informed that the remainder of the evaluation will be completed by another provider, this initial triage assessment does not replace that evaluation, and the importance of remaining in the ED until their evaluation is complete.  Work-up initiated.    Tasha Fernandez A, PA-C 12/04/21 1313

## 2021-12-04 NOTE — ED Triage Notes (Addendum)
Patient said there is a strong odor to her urine. No painful urination, but said she feels abdominal cramping and pressure. She said she is having vaginal spotting for months. History of polyps.

## 2022-05-13 ENCOUNTER — Ambulatory Visit (HOSPITAL_COMMUNITY)
Admission: EM | Admit: 2022-05-13 | Discharge: 2022-05-13 | Disposition: A | Discharge status: Home / Self Care | Payer: Medicaid Other

## 2022-05-13 ENCOUNTER — Emergency Department (HOSPITAL_COMMUNITY): Payer: Medicaid Other

## 2022-05-13 ENCOUNTER — Other Ambulatory Visit: Payer: Self-pay

## 2022-05-13 ENCOUNTER — Encounter (HOSPITAL_COMMUNITY): Payer: Self-pay

## 2022-05-13 ENCOUNTER — Inpatient Hospital Stay (HOSPITAL_COMMUNITY)
Admission: EM | Admit: 2022-05-13 | Discharge: 2022-05-16 | DRG: 872 | Disposition: A | Discharge status: Home / Self Care | Payer: Medicaid Other | Attending: Internal Medicine | Admitting: Internal Medicine

## 2022-05-13 ENCOUNTER — Encounter (HOSPITAL_COMMUNITY): Payer: Self-pay | Admitting: Emergency Medicine

## 2022-05-13 DIAGNOSIS — Z79899 Other long term (current) drug therapy: Secondary | ICD-10-CM

## 2022-05-13 DIAGNOSIS — G9341 Metabolic encephalopathy: Secondary | ICD-10-CM

## 2022-05-13 DIAGNOSIS — G43909 Migraine, unspecified, not intractable, without status migrainosus: Secondary | ICD-10-CM | POA: Diagnosis present

## 2022-05-13 DIAGNOSIS — A4151 Sepsis due to Escherichia coli [E. coli]: Principal | ICD-10-CM | POA: Diagnosis present

## 2022-05-13 DIAGNOSIS — N390 Urinary tract infection, site not specified: Secondary | ICD-10-CM

## 2022-05-13 DIAGNOSIS — K219 Gastro-esophageal reflux disease without esophagitis: Secondary | ICD-10-CM | POA: Diagnosis present

## 2022-05-13 DIAGNOSIS — E861 Hypovolemia: Secondary | ICD-10-CM | POA: Diagnosis present

## 2022-05-13 DIAGNOSIS — N10 Acute tubulo-interstitial nephritis: Secondary | ICD-10-CM | POA: Diagnosis present

## 2022-05-13 DIAGNOSIS — R112 Nausea with vomiting, unspecified: Secondary | ICD-10-CM

## 2022-05-13 DIAGNOSIS — R519 Headache, unspecified: Secondary | ICD-10-CM

## 2022-05-13 DIAGNOSIS — N179 Acute kidney failure, unspecified: Secondary | ICD-10-CM | POA: Diagnosis present

## 2022-05-13 DIAGNOSIS — E860 Dehydration: Secondary | ICD-10-CM | POA: Diagnosis present

## 2022-05-13 DIAGNOSIS — N12 Tubulo-interstitial nephritis, not specified as acute or chronic: Secondary | ICD-10-CM

## 2022-05-13 DIAGNOSIS — A419 Sepsis, unspecified organism: Secondary | ICD-10-CM | POA: Diagnosis present

## 2022-05-13 DIAGNOSIS — R6520 Severe sepsis without septic shock: Secondary | ICD-10-CM | POA: Diagnosis present

## 2022-05-13 DIAGNOSIS — M130 Polyarthritis, unspecified: Secondary | ICD-10-CM | POA: Diagnosis present

## 2022-05-13 DIAGNOSIS — E871 Hypo-osmolality and hyponatremia: Secondary | ICD-10-CM | POA: Diagnosis present

## 2022-05-13 DIAGNOSIS — Z885 Allergy status to narcotic agent status: Secondary | ICD-10-CM

## 2022-05-13 DIAGNOSIS — Z789 Other specified health status: Secondary | ICD-10-CM

## 2022-05-13 DIAGNOSIS — Z91030 Bee allergy status: Secondary | ICD-10-CM

## 2022-05-13 DIAGNOSIS — R509 Fever, unspecified: Principal | ICD-10-CM

## 2022-05-13 DIAGNOSIS — F411 Generalized anxiety disorder: Secondary | ICD-10-CM | POA: Diagnosis present

## 2022-05-13 LAB — COMPREHENSIVE METABOLIC PANEL
ALT: 59 U/L — ABNORMAL HIGH (ref 0–44)
AST: 36 U/L (ref 15–41)
Albumin: 3.4 g/dL — ABNORMAL LOW (ref 3.5–5.0)
Alkaline Phosphatase: 63 U/L (ref 38–126)
Anion gap: 9 (ref 5–15)
BUN: 14 mg/dL (ref 6–20)
CO2: 25 mmol/L (ref 22–32)
Calcium: 8.5 mg/dL — ABNORMAL LOW (ref 8.9–10.3)
Chloride: 98 mmol/L (ref 98–111)
Creatinine, Ser: 1.05 mg/dL — ABNORMAL HIGH (ref 0.44–1.00)
GFR, Estimated: >60 mL/min (ref >60)
Glucose, Bld: 117 mg/dL — ABNORMAL HIGH (ref 70–99)
Potassium: 3.8 mmol/L (ref 3.5–5.1)
Sodium: 132 mmol/L — ABNORMAL LOW (ref 135–145)
Total Bilirubin: 1.3 mg/dL — ABNORMAL HIGH (ref 0.3–1.2)
Total Protein: 6.9 g/dL (ref 6.5–8.1)

## 2022-05-13 LAB — URINALYSIS, ROUTINE W REFLEX MICROSCOPIC
Bilirubin Urine: NEGATIVE
Glucose, UA: NEGATIVE mg/dL
Ketones, ur: NEGATIVE mg/dL
Nitrite: NEGATIVE
Protein, ur: 100 mg/dL — AB
RBC / HPF: >50 RBC/hpf (ref 0–5)
Specific Gravity, Urine: 1.025 (ref 1.005–1.030)
WBC, UA: >50 WBC/hpf (ref 0–5)
pH: 6 (ref 5.0–8.0)

## 2022-05-13 LAB — CBC WITH DIFFERENTIAL/PLATELET
Abs Immature Granulocytes: 0.11 10*3/uL — ABNORMAL HIGH (ref 0.00–0.07)
Basophils Absolute: 0.1 10*3/uL (ref 0.0–0.1)
Basophils Relative: 1 %
Eosinophils Absolute: 0 10*3/uL (ref 0.0–0.5)
Eosinophils Relative: 0 %
HCT: 39.2 % (ref 36.0–46.0)
Hemoglobin: 12.7 g/dL (ref 12.0–15.0)
Immature Granulocytes: 1 %
Lymphocytes Relative: 12 %
Lymphs Abs: 2 10*3/uL (ref 0.7–4.0)
MCH: 29.9 pg (ref 26.0–34.0)
MCHC: 32.4 g/dL (ref 30.0–36.0)
MCV: 92.2 fL (ref 80.0–100.0)
Monocytes Absolute: 1.8 10*3/uL — ABNORMAL HIGH (ref 0.1–1.0)
Monocytes Relative: 11 %
Neutro Abs: 12.9 10*3/uL — ABNORMAL HIGH (ref 1.7–7.7)
Neutrophils Relative %: 75 %
Platelets: 300 10*3/uL (ref 150–400)
RBC: 4.25 MIL/uL (ref 3.87–5.11)
RDW: 12.2 % (ref 11.5–15.5)
WBC: 17 10*3/uL — ABNORMAL HIGH (ref 4.0–10.5)
nRBC: 0 % (ref 0.0–0.2)

## 2022-05-13 LAB — LIPASE, BLOOD: Lipase: 25 U/L (ref 11–51)

## 2022-05-13 MED ORDER — ACETAMINOPHEN 500 MG PO TABS
1000.0000 mg | ORAL_TABLET | Freq: Once | ORAL | Status: DC
  Filled 2022-05-13: qty 2

## 2022-05-13 MED ORDER — LACTATED RINGERS IV BOLUS
1000.0000 mL | Freq: Once | INTRAVENOUS | Status: AC
  Administered 2022-05-14: 1000 mL via INTRAVENOUS

## 2022-05-13 MED ORDER — LACTATED RINGERS IV BOLUS
1000.0000 mL | Freq: Once | INTRAVENOUS | Status: AC
  Administered 2022-05-13: 1000 mL via INTRAVENOUS

## 2022-05-13 MED ORDER — CEFTRIAXONE SODIUM 1 G IJ SOLR
1.0000 g | Freq: Once | INTRAMUSCULAR | Status: AC
  Administered 2022-05-13: 1 g via INTRAVENOUS
  Filled 2022-05-13: qty 10

## 2022-05-13 MED ORDER — ONDANSETRON 4 MG PO TBDP
4.0000 mg | ORAL_TABLET | Freq: Once | ORAL | Status: AC
  Administered 2022-05-13: 4 mg via ORAL
  Filled 2022-05-13: qty 1

## 2022-05-13 MED ORDER — CEPHALEXIN 500 MG PO CAPS: 500.0000 mg | ORAL_CAPSULE | Freq: Four times a day (QID) | ORAL | 0 refills | Status: DC

## 2022-05-13 MED ORDER — ONDANSETRON 8 MG PO TBDP: 8.0000 mg | ORAL_TABLET | Freq: Three times a day (TID) | ORAL | 0 refills | Status: DC | PRN

## 2022-05-13 MED ORDER — ACETAMINOPHEN 500 MG PO TABS
1000.0000 mg | ORAL_TABLET | Freq: Once | ORAL | Status: AC
  Administered 2022-05-13: 1000 mg via ORAL
  Filled 2022-05-13: qty 2

## 2022-05-13 NOTE — ED Triage Notes (Signed)
Pt states headache and chills for the past 3 days. Pt anxious and seems confused at times not giving complete answers during triage.

## 2022-05-13 NOTE — ED Notes (Signed)
Patient is being discharged from the Urgent Care and sent to the Emergency Department via private vehicle . Per Harle Stanford NP, patient is in need of higher level of care due to migraine headache/confusion. Patient is aware and verbalizes understanding of plan of care.  Vitals:   05/13/22 1929  BP: 121/75  Pulse: (!) 104  Resp: 18  Temp: 99 F (37.2 C)  SpO2: 98%

## 2022-05-13 NOTE — ED Provider Notes (Signed)
[facility] EMERGENCY DEPARTMENT AT [facility] Provider Note   CSN: [id number] Arrival date & time: 05/13/22  1958     History  Chief Complaint  Patient presents with   Fever   Dysuria   nausea and vomiting    Tasha Fernandez is a 51 y.o. female.  Pt with fever, odorous urine, urine frequency, and nausea/vomiting in past day. Symptoms acute onset. Remote hx uti, denies recent, or recent antibiotic use. No vaginal discharge or bleeding. Generalized abd pain/not localized. Denies chest pain or sob. No cough or sore throat. Mild intermittent headache. No acute, abrupt or severe head pain. No specific known ill contacts. No known bad food ingestion.   The history is provided by the patient and medical records.  Headache Associated symptoms: abdominal pain, fever, nausea and vomiting   Associated symptoms: no back pain, no cough, no eye pain, no neck pain, no neck stiffness, no numbness, no sore throat and no weakness        Home Medications Prior to Admission medications   Medication Sig Start Date End Date Taking? Authorizing Provider  Aspirin-Acetaminophen (GOODYS BODY PAIN PO) Take 1 packet by mouth daily as needed (pain).    [provider]  aspirin-acetaminophen-caffeine (EXCEDRIN MIGRAINE) [phone] MG per tablet Take 1 tablet by mouth every 6 (six) hours as needed for pain.    [provider]  CarBAMazepine (TEGRETOL PO) Take 1 tablet by mouth 2 (two) times daily.    [provider]  cephALEXin (KEFLEX) 500 MG capsule Take 1 capsule (500 mg total) by mouth 4 (four) times daily. 03/14/21   Lorre Nick, MD  cyclobenzaprine (FLEXERIL) 10 MG tablet Take 10 mg by mouth 3 (three) times daily as needed. Muscle spasms 04/30/13   [provider]  HYDROcodone-acetaminophen (NORCO/VICODIN) 5-325 MG per tablet Take 1-2 tablets by mouth every 6 (six) hours as needed for moderate pain (pain).    [provider]   HYDROcodone-acetaminophen (NORCO/VICODIN) 5-325 MG per tablet Take 1 tablet by mouth every 4 (four) hours as needed for moderate pain. 06/19/13   Schinlever, Santina Evans, PA-C  ibuprofen (ADVIL,MOTRIN) 200 MG tablet Take 400 mg by mouth every 6 (six) hours as needed for moderate pain.    [provider]  OnabotulinumtoxinA (BOTOX IJ) Inject as directed. Injection given at [facility] every 3 months    [provider]  ondansetron (ZOFRAN-ODT) 8 MG disintegrating tablet Take 1 tablet (8 mg total) by mouth every 8 (eight) hours as needed for nausea or vomiting. 03/14/21   Lorre Nick, MD  pantoprazole (PROTONIX) 20 MG tablet Take 1 tablet (20 mg total) by mouth 2 (two) times daily. 03/14/21   Lorre Nick, MD  venlafaxine XR (EFFEXOR-XR) 150 MG 24 hr capsule Take 150 mg by mouth daily. 05/16/13 05/16/14  [provider]  montelukast (SINGULAIR) 10 MG tablet Take 1 tablet (10 mg total) by mouth daily. 07/02/10 04/21/11  Tobey Grim, MD      Allergies    Dilaudid [hydromorphone] and Other    Review of Systems   Review of Systems  Constitutional:  Positive for fever.  HENT:  Negative for rhinorrhea, sinus pain and sore throat.   Eyes:  Negative for pain, redness and visual disturbance.  Respiratory:  Negative for cough and shortness of breath.   Cardiovascular:  Negative for chest pain and leg swelling.  Gastrointestinal:  Positive for abdominal pain, nausea and vomiting. Negative for abdominal distention.  Genitourinary:  Positive  for urgency. Negative for flank pain.  Musculoskeletal:  Negative for back pain, neck pain and neck stiffness.  Skin:  Negative for rash.  Neurological:  Positive for headaches. Negative for speech difficulty, weakness and numbness.  Hematological:  Does not bruise/bleed easily.    Physical Exam Updated Vital Signs BP (!) 148/81 (BP Location: Right Arm)   Pulse (!) 120   Temp (!) 102.2 F (39 C)   Resp 20   LMP 06/11/2013    SpO2 100%  Physical Exam Vitals and nursing note reviewed.  Constitutional:      Appearance: Normal appearance. She is well-developed.     Comments: Febrile.   HENT:     Head: Atraumatic.     Right Ear: Tympanic membrane normal.     Left Ear: Tympanic membrane normal.     Nose: Nose normal. No congestion or rhinorrhea.     Mouth/Throat:     Mouth: Mucous membranes are moist.     Pharynx: Oropharynx is clear.  Eyes:     General: No scleral icterus.    Conjunctiva/sclera: Conjunctivae normal.     Pupils: Pupils are equal, round, and reactive to light.  Neck:     Trachea: No tracheal deviation.     Comments: No stiffness or rigidity.  Cardiovascular:     Rate and Rhythm: Regular rhythm. Tachycardia present.     Pulses: Normal pulses.     Heart sounds: Normal heart sounds. No murmur heard.    No friction rub. No gallop.  Pulmonary:     Effort: Pulmonary effort is normal. No respiratory distress.     Breath sounds: Normal breath sounds.  Abdominal:     General: Bowel sounds are normal. There is no distension.     Palpations: Abdomen is soft. There is no mass.     Tenderness: There is no abdominal tenderness. There is no guarding.  Genitourinary:    Comments: No cva tenderness.  Musculoskeletal:        General: No swelling.     Cervical back: Normal range of motion and neck supple. No rigidity. No muscular tenderness.     Comments: CTLS spine, non tender, aligned, no step off. Good rom bil extremities without pain or focal bony tenderness. No swelling.   Lymphadenopathy:     Cervical: No cervical adenopathy.  Skin:    General: Skin is warm and dry.     Findings: No rash.     Comments: No cellulitis, skin lesions or rash.   Neurological:     Mental Status: She is alert.     Comments: Alert, speech normal. Motor/sens grossly intact bil. Steady gait.   Psychiatric:        Mood and Affect: Mood normal.     ED Results / Procedures / Treatments   Labs (all labs ordered are  listed, but only abnormal results are displayed) Results for orders placed or performed during the hospital encounter of 05/13/22  CBC with Differential  Result Value Ref Range   WBC 17.0 (H) 4.0 - 10.5 K/uL   RBC 4.25 3.87 - 5.11 MIL/uL   Hemoglobin 12.7 12.0 - 15.0 g/dL   HCT 36.639.2 44.036.0 - 34.746.0 %   MCV 92.2 80.0 - 100.0 fL   MCH 29.9 26.0 - 34.0 pg   MCHC 32.4 30.0 - 36.0 g/dL   RDW 42.512.2 95.611.5 - 38.715.5 %   Platelets 300 150 - 400 K/uL   nRBC 0.0 0.0 - 0.2 %   Neutrophils Relative %  75 %   Neutro Abs 12.9 (H) 1.7 - 7.7 K/uL   Lymphocytes Relative 12 %   Lymphs Abs 2.0 0.7 - 4.0 K/uL   Monocytes Relative 11 %   Monocytes Absolute 1.8 (H) 0.1 - 1.0 K/uL   Eosinophils Relative 0 %   Eosinophils Absolute 0.0 0.0 - 0.5 K/uL   Basophils Relative 1 %   Basophils Absolute 0.1 0.0 - 0.1 K/uL   Immature Granulocytes 1 %   Abs Immature Granulocytes 0.11 (H) 0.00 - 0.07 K/uL  Comprehensive metabolic panel  Result Value Ref Range   Sodium 132 (L) 135 - 145 mmol/L   Potassium 3.8 3.5 - 5.1 mmol/L   Chloride 98 98 - 111 mmol/L   CO2 25 22 - 32 mmol/L   Glucose, Bld 117 (H) 70 - 99 mg/dL   BUN 14 6 - 20 mg/dL   Creatinine, Ser 1.611.05 (H) 0.44 - 1.00 mg/dL   Calcium 8.5 (L) 8.9 - 10.3 mg/dL   Total Protein 6.9 6.5 - 8.1 g/dL   Albumin 3.4 (L) 3.5 - 5.0 g/dL   AST 36 15 - 41 U/L   ALT 59 (H) 0 - 44 U/L   Alkaline Phosphatase 63 38 - 126 U/L   Total Bilirubin 1.3 (H) 0.3 - 1.2 mg/dL   GFR, Estimated >09>60 >60>60 mL/min   Anion gap 9 5 - 15  Lipase, blood  Result Value Ref Range   Lipase 25 11 - 51 U/L  Urinalysis, Routine w reflex microscopic -Urine, Clean Catch  Result Value Ref Range   Color, Urine YELLOW YELLOW   APPearance HAZY (A) CLEAR   Specific Gravity, Urine 1.025 1.005 - 1.030   pH 6.0 5.0 - 8.0   Glucose, UA NEGATIVE NEGATIVE mg/dL   Hgb urine dipstick LARGE (A) NEGATIVE   Bilirubin Urine NEGATIVE NEGATIVE   Ketones, ur NEGATIVE NEGATIVE mg/dL   Protein, ur 454100 (A) NEGATIVE  mg/dL   Nitrite NEGATIVE NEGATIVE   Leukocytes,Ua SMALL (A) NEGATIVE   RBC / HPF >50 0 - 5 RBC/hpf   WBC, UA >50 0 - 5 WBC/hpf   Bacteria, UA MANY (A) NONE SEEN   Squamous Epithelial / HPF 0-5 0 - 5 /HPF   Mucus PRESENT    CT Head Wo Contrast  Result Date: 05/13/2022 CLINICAL DATA:  Headaches for 3 days EXAM: CT HEAD WITHOUT CONTRAST TECHNIQUE: Contiguous axial images were obtained from the base of the skull through the vertex without intravenous contrast. RADIATION DOSE REDUCTION: This exam was performed according to the departmental dose-optimization program which includes automated exposure control, adjustment of the mA and/or kV according to patient size and/or use of iterative reconstruction technique. COMPARISON:  10/01/2009 FINDINGS: Brain: No evidence of acute infarction, hemorrhage, hydrocephalus, extra-axial collection or mass lesion/mass effect. Vascular: No hyperdense vessel or unexpected calcification. Skull: Normal. Negative for fracture or focal lesion. Sinuses/Orbits: No acute finding. Other: None. IMPRESSION: Mild motion artifact is noted.  No acute abnormality is seen. Electronically Signed   By: Alcide CleverMark  Lukens M.D.   On: 05/13/2022 21:17    EKG EKG Interpretation  Date/Time:  Friday May 13 2022 20:35:56 EDT Ventricular Rate:  122 PR Interval:  120 QRS Duration: 78 QT Interval:  272 QTC Calculation: 387 R Axis:   56 Text Interpretation: Sinus tachycardia Confirmed by Cathren LaineSteinl, Hellon Vaccarella ([id number]) on 05/13/2022 9:54:59 PM  Radiology CT Head Wo Contrast  Result Date: 05/13/2022 CLINICAL DATA:  Headaches for 3 days EXAM: CT HEAD WITHOUT CONTRAST TECHNIQUE:  Contiguous axial images were obtained from the base of the skull through the vertex without intravenous contrast. RADIATION DOSE REDUCTION: This exam was performed according to the departmental dose-optimization program which includes automated exposure control, adjustment of the mA and/or kV according to patient size and/or use of  iterative reconstruction technique. COMPARISON:  10/01/2009 FINDINGS: Brain: No evidence of acute infarction, hemorrhage, hydrocephalus, extra-axial collection or mass lesion/mass effect. Vascular: No hyperdense vessel or unexpected calcification. Skull: Normal. Negative for fracture or focal lesion. Sinuses/Orbits: No acute finding. Other: None. IMPRESSION: Mild motion artifact is noted.  No acute abnormality is seen. Electronically Signed   By: Alcide Clever M.D.   On: 05/13/2022 21:17    Procedures Procedures    Medications Ordered in ED Medications  acetaminophen (TYLENOL) tablet 1,000 mg (has no administration in time range)  cefTRIAXone (ROCEPHIN) 1 g in sodium chloride 0.9 % 100 mL IVPB (has no administration in time range)  lactated ringers bolus 1,000 mL (has no administration in time range)  lactated ringers bolus 1,000 mL (has no administration in time range)  acetaminophen (TYLENOL) tablet 1,000 mg (1,000 mg Oral Given 05/13/22 2104)  ondansetron (ZOFRAN-ODT) disintegrating tablet 4 mg (4 mg Oral Given 05/13/22 2104)    ED Course/ Medical Decision Making/ A&P                             Medical Decision Making Problems Addressed: Acute febrile illness: acute illness or injury with systemic symptoms that poses a threat to life or bodily functions Acute urinary tract infection: acute illness or injury with systemic symptoms that poses a threat to life or bodily functions Intermittent headache: acute illness or injury Nausea and vomiting in adult: acute illness or injury with systemic symptoms that poses a threat to life or bodily functions  Amount and/or Complexity of Data Reviewed External Data Reviewed: labs, radiology and notes. Labs: ordered. Decision-making details documented in ED Course. Radiology: ordered and independent interpretation performed. Decision-making details documented in ED Course. ECG/medicine tests: ordered and independent interpretation performed.  Decision-making details documented in ED Course.  Risk OTC drugs. Prescription drug management. Decision regarding hospitalization.   Iv ns. Continuous pulse ox and cardiac monitoring. Labs ordered/sent. Imaging ordered.   Differential diagnosis includes uti/pyelo, viral syndrome, dehydration, etc. Dispo decision including potential need for admission considered - will get labs and imaging and reassess.   Reviewed nursing notes and prior charts for additional history. External reports reviewed.   Cardiac monitor: sinus rhythm, rate 110.  Labs reviewed/interpreted by me - wbc elevated. Ua c/w uti. Urine culture sent. Rocephin iv. LR bolus.   CT reviewed/interpreted by me - no hem.   Additional ivf. Trial po. Ct abd/pelvis pending.  2335 signed out to Dr Manus Gunning to check ct, recheck post ct and ivf, and dispo appropriately.           Final Clinical Impression(s) / ED Diagnoses Final diagnoses:  None    Rx / DC Orders ED Discharge Orders     None         Cathren Laine, MD 05/13/22 2339

## 2022-05-13 NOTE — ED Triage Notes (Signed)
Pt reports she has had abdominal pain and odorous urine. She also has a headache, nausea, vomiting, diarrhea. Pt was unaware that she had a fever.

## 2022-05-13 NOTE — ED Provider Notes (Signed)
[facility]    CSN: [id number] Arrival date & time: 05/13/22  1913      History   Chief Complaint Chief Complaint  Patient presents with   Headache    HPI Tasha NunneryStephanie S Brossart is a 52 y.o. female.   Patient presents to clinic with complaint of a headache.  She reports they have been ongoing for months.  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  She said she had been taking natural over-the-counter medications for her headache however when questioned further about this she was unable to tell me what medication she is taking.  She is unable to quantify her pain on a 0-10 scale.  She is unable to tell me her boyfriend's name.  She was unable to dial his number in the room.      The history is provided by the patient and medical records.  Headache   Past Medical History:  Diagnosis Date   Anxiety    Arthritis    Herniated disc    Seasonal allergies    TMJ (dislocation of temporomandibular joint)     Patient Active Problem List   Diagnosis Date Noted   Otitis, externa, infective 10/03/2012   Seasonal allergies 10/03/2012   Tachycardia 08/08/2012   Metrorrhagia 04/30/2012   Menorrhagia 04/30/2012   Atypical face pain 03/28/2012   Medical non-compliance 02/17/2012   Headache 11/19/2011   Chronic pain syndrome 10/09/2011   Cervicalgia 06/17/2011   Chronic jaw pain 04/22/2011   Medication overuse headache 01/17/2011   Somatic Dysfunction (Osteopathic Findings) 01/13/2011   Chronic headache 08/26/2010   ALLERGIC RHINITIS, SEASONAL 04/24/2008   GERD 04/24/2008   ANXIETY 04/06/2006    Past Surgical History:  Procedure Laterality Date   CESAREAN SECTION     CHOLECYSTECTOMY     TUBAL LIGATION      OB History   No obstetric history on file.      Home Medications    Prior to Admission medications   Medication Sig Start Date End Date Taking? Authorizing Provider  Aspirin-Acetaminophen (GOODYS BODY PAIN PO) Take 1 packet by mouth daily as needed (pain).    [provider]  aspirin-acetaminophen-caffeine (EXCEDRIN MIGRAINE) [phone] MG per tablet Take 1 tablet by mouth every 6 (six) hours as needed for pain.    [provider]  CarBAMazepine (TEGRETOL PO) Take 1 tablet by mouth 2 (two) times daily.    [provider]  cephALEXin (KEFLEX) 500 MG capsule Take 1 capsule (500 mg total) by mouth 4 (four) times daily. 03/14/21   Lorre NickAllen, Anthony, MD  cyclobenzaprine (FLEXERIL) 10 MG tablet Take 10 mg by mouth 3 (three) times daily as needed. Muscle spasms 04/30/13   [provider]  HYDROcodone-acetaminophen (NORCO/VICODIN) 5-325 MG per tablet Take 1-2 tablets by mouth every 6 (six) hours as needed for moderate pain (pain).    [provider]  HYDROcodone-acetaminophen (NORCO/VICODIN) 5-325 MG per tablet Take 1 tablet by mouth every 4 (four) hours as needed for moderate pain. 06/19/13   Schinlever, Santina Evansatherine, PA-C  ibuprofen (ADVIL,MOTRIN) 200 MG tablet Take 400 mg by mouth every 6 (six) hours as needed for moderate pain.    [provider]  OnabotulinumtoxinA (BOTOX IJ) Inject as directed. Injection given at [facility] every 3 months    [provider]  ondansetron (ZOFRAN-ODT) 8 MG disintegrating tablet Take 1 tablet (8 mg total) by mouth every 8 (eight) hours as needed for nausea or vomiting. 03/14/21   Lorre NickAllen, Anthony, MD  pantoprazole (PROTONIX) 20 MG tablet Take 1 tablet (20 mg total) by mouth 2 (two) times daily. 03/14/21   Lorre Nick, MD  venlafaxine XR (EFFEXOR-XR) 150 MG 24 hr capsule Take 150 mg by mouth daily. 05/16/13 05/16/14  [provider]  montelukast (SINGULAIR) 10 MG tablet Take 1 tablet (10 mg total) by mouth daily. 07/02/10 04/21/11  Tobey Grim, MD    Family History Family History  Problem Relation Age of Onset   Bursitis Mother     Social History Social History   Tobacco Use   Smoking status: Never   Smokeless tobacco: Never  Vaping Use   Vaping Use: Never  used  Substance Use Topics   Alcohol use: No    Comment: social   Drug use: No     Allergies   Dilaudid [hydromorphone] and Other   Review of Systems Review of Systems  Neurological:  Positive for headaches.     Physical Exam Triage Vital Signs ED Triage Vitals  Enc Vitals Group     BP 05/13/22 1929 121/75     Pulse Rate 05/13/22 1929 (!) 104     Resp 05/13/22 1929 18     Temp 05/13/22 1929 99 F (37.2 C)     Temp Source 05/13/22 1929 Oral     SpO2 05/13/22 1929 98 %     Weight --      Height --      Head Circumference --      Peak Flow --      Pain Score 05/13/22 1930 10     Pain Loc --      Pain Edu? --      Excl. in GC? --    No data found.  Updated Vital Signs BP 121/75 (BP Location: Left Arm)   Pulse (!) 104   Temp 99 F (37.2 C) (Oral)   Resp 18   LMP 06/11/2013   SpO2 98%   Visual Acuity Right Eye Distance:   Left Eye Distance:   Bilateral Distance:    Right Eye Near:   Left Eye Near:    Bilateral Near:     Physical Exam Vitals and nursing note reviewed.  Neurological:     Mental Status: She is alert.  Psychiatric:        Cognition and Memory: Memory is impaired.      UC Treatments / Results  Labs (all labs ordered are listed, but only abnormal results are displayed) Labs Reviewed - No data to display  EKG   Radiology No results found.  Procedures Procedures (including critical care time)  Medications Ordered in UC Medications - No data to display  Initial Impression / Assessment and Plan / UC Course  I have reviewed the triage vital signs and the nursing notes.  Pertinent labs & imaging results that were available during my care of the patient were reviewed by me and considered in my medical decision making (see chart for details).  Vitals and triage reviewed, unable to complete assessment.  Patient is a poor historian.  Pupils were pinpoint but equal bilaterally.  Appears to have impaired memory.  Unclear as this is  due to her headache, or potentially drug-induced.  Advised to head to the nearest emergency department for further evaluation and workup.  Patient unable to dial her boyfriend's number, was even unable to remember his name in the room.  Walked out to her boyfriend who is in car, who agreed to take her personal vehicle  to the nearest emergency room.     Final Clinical Impressions(s) / UC Diagnoses   Final diagnoses:  Acute nonintractable headache, unspecified headache type  Poor historian     Discharge Instructions      Please go to the nearest emergency department for further evaluation for your headache, abdominal pain and other symptoms.  You are unable to give me a straight medical history, and unable to quantify your pain, and this is concerning.  I would like you to get further evaluation and workup in the emergency department.       ED Prescriptions   None    PDMP not reviewed this encounter.   Damire Remedios, [place], [place] 05/13/22 2019

## 2022-05-13 NOTE — Discharge Instructions (Addendum)
It was our pleasure to provide your ER care today - we hope that you feel better.  Drink plenty of fluids/stay well hydrated. Take keflex (antibiotic) as prescribed. Take acetaminophen or ibuprofen as need. Take zofran as need for nausea.   Follow up with primary care doctor in the coming week.  Return to ER if worse, new symptoms, persistent vomiting, new or severe or worsening abdominal pain, persistent vomiting, weak/fainting, severe headache, or other concern.

## 2022-05-13 NOTE — ED Provider Triage Note (Signed)
Emergency Medicine Provider Triage Evaluation Note  Tasha Fernandez , a 52 y.o. female  was evaluated in triage.  Pt complains of fever, headache, UTI.  Review of Systems  Positive: *** Negative: ***  Physical Exam  BP (!) 148/81 (BP Location: Right Arm)   Pulse (!) 120   Temp (!) 102.2 F (39 C)   Resp 20   LMP 06/11/2013   SpO2 100%  Gen:   Awake, no distress  *** Resp:  Normal effort *** MSK:   Moves extremities without difficulty *** Other:  ***  Medical Decision Making  Medically screening exam initiated at 8:26 PM.  Appropriate orders placed.  Tasha Fernandez was informed that the remainder of the evaluation will be completed by another provider, this initial triage assessment does not replace that evaluation, and the importance of remaining in the ED until their evaluation is complete.  ***

## 2022-05-13 NOTE — ED Notes (Signed)
Pt walked out to car by myself and  Harle Stanford NP.  Pts SO advised to take patient down to the ED for further evaluation of her headache.

## 2022-05-13 NOTE — Discharge Instructions (Addendum)
Please go to the nearest emergency department for further evaluation for your headache, abdominal pain and other symptoms.  You are unable to give me a straight medical history, and unable to quantify your pain, and this is concerning.  I would like you to get further evaluation and workup in the emergency department.

## 2022-05-14 ENCOUNTER — Encounter (HOSPITAL_COMMUNITY): Payer: Self-pay | Admitting: Internal Medicine

## 2022-05-14 DIAGNOSIS — E861 Hypovolemia: Secondary | ICD-10-CM | POA: Diagnosis present

## 2022-05-14 DIAGNOSIS — Z885 Allergy status to narcotic agent status: Secondary | ICD-10-CM | POA: Diagnosis not present

## 2022-05-14 DIAGNOSIS — Z79899 Other long term (current) drug therapy: Secondary | ICD-10-CM | POA: Diagnosis not present

## 2022-05-14 DIAGNOSIS — R6520 Severe sepsis without septic shock: Secondary | ICD-10-CM

## 2022-05-14 DIAGNOSIS — G43909 Migraine, unspecified, not intractable, without status migrainosus: Secondary | ICD-10-CM | POA: Diagnosis present

## 2022-05-14 DIAGNOSIS — E860 Dehydration: Secondary | ICD-10-CM | POA: Diagnosis present

## 2022-05-14 DIAGNOSIS — K219 Gastro-esophageal reflux disease without esophagitis: Secondary | ICD-10-CM

## 2022-05-14 DIAGNOSIS — Z91030 Bee allergy status: Secondary | ICD-10-CM | POA: Diagnosis not present

## 2022-05-14 DIAGNOSIS — F411 Generalized anxiety disorder: Secondary | ICD-10-CM

## 2022-05-14 DIAGNOSIS — N10 Acute tubulo-interstitial nephritis: Secondary | ICD-10-CM | POA: Diagnosis present

## 2022-05-14 DIAGNOSIS — M130 Polyarthritis, unspecified: Secondary | ICD-10-CM | POA: Diagnosis present

## 2022-05-14 DIAGNOSIS — E871 Hypo-osmolality and hyponatremia: Secondary | ICD-10-CM | POA: Diagnosis present

## 2022-05-14 DIAGNOSIS — N12 Tubulo-interstitial nephritis, not specified as acute or chronic: Secondary | ICD-10-CM | POA: Diagnosis present

## 2022-05-14 DIAGNOSIS — A4151 Sepsis due to Escherichia coli [E. coli]: Secondary | ICD-10-CM | POA: Diagnosis present

## 2022-05-14 DIAGNOSIS — N179 Acute kidney failure, unspecified: Secondary | ICD-10-CM | POA: Diagnosis present

## 2022-05-14 DIAGNOSIS — A419 Sepsis, unspecified organism: Secondary | ICD-10-CM | POA: Diagnosis present

## 2022-05-14 LAB — TSH: TSH: 4.522 u[IU]/mL — ABNORMAL HIGH (ref 0.350–4.500)

## 2022-05-14 LAB — CBC WITH DIFFERENTIAL/PLATELET
Abs Immature Granulocytes: 0.1 10*3/uL — ABNORMAL HIGH (ref 0.00–0.07)
Basophils Absolute: 0.1 10*3/uL (ref 0.0–0.1)
Basophils Relative: 1 %
Eosinophils Absolute: 0 10*3/uL (ref 0.0–0.5)
Eosinophils Relative: 0 %
HCT: 37.9 % (ref 36.0–46.0)
Hemoglobin: 12.6 g/dL (ref 12.0–15.0)
Immature Granulocytes: 1 %
Lymphocytes Relative: 16 %
Lymphs Abs: 2.6 10*3/uL (ref 0.7–4.0)
MCH: 30.4 pg (ref 26.0–34.0)
MCHC: 33.2 g/dL (ref 30.0–36.0)
MCV: 91.3 fL (ref 80.0–100.0)
Monocytes Absolute: 1.7 10*3/uL — ABNORMAL HIGH (ref 0.1–1.0)
Monocytes Relative: 10 %
Neutro Abs: 11.6 10*3/uL — ABNORMAL HIGH (ref 1.7–7.7)
Neutrophils Relative %: 72 %
Platelets: 272 10*3/uL (ref 150–400)
RBC: 4.15 MIL/uL (ref 3.87–5.11)
RDW: 12.2 % (ref 11.5–15.5)
WBC: 16 10*3/uL — ABNORMAL HIGH (ref 4.0–10.5)
nRBC: 0 % (ref 0.0–0.2)

## 2022-05-14 LAB — COMPREHENSIVE METABOLIC PANEL
ALT: 53 U/L — ABNORMAL HIGH (ref 0–44)
AST: 33 U/L (ref 15–41)
Albumin: 3 g/dL — ABNORMAL LOW (ref 3.5–5.0)
Alkaline Phosphatase: 57 U/L (ref 38–126)
Anion gap: 9 (ref 5–15)
BUN: 11 mg/dL (ref 6–20)
CO2: 24 mmol/L (ref 22–32)
Calcium: 8.4 mg/dL — ABNORMAL LOW (ref 8.9–10.3)
Chloride: 102 mmol/L (ref 98–111)
Creatinine, Ser: 0.96 mg/dL (ref 0.44–1.00)
GFR, Estimated: >60 mL/min (ref >60)
Glucose, Bld: 113 mg/dL — ABNORMAL HIGH (ref 70–99)
Potassium: 3.7 mmol/L (ref 3.5–5.1)
Sodium: 135 mmol/L (ref 135–145)
Total Bilirubin: 1.1 mg/dL (ref 0.3–1.2)
Total Protein: 6.4 g/dL — ABNORMAL LOW (ref 6.5–8.1)

## 2022-05-14 LAB — MAGNESIUM: Magnesium: 1.9 mg/dL (ref 1.7–2.4)

## 2022-05-14 LAB — LACTIC ACID, PLASMA
Lactic Acid, Venous: 1 mmol/L (ref 0.5–1.9)
Lactic Acid, Venous: 1.3 mmol/L (ref 0.5–1.9)

## 2022-05-14 LAB — CULTURE, BLOOD (ROUTINE X 2): Culture: NO GROWTH

## 2022-05-14 LAB — OSMOLALITY: Osmolality: 287 mOsm/kg (ref 275–295)

## 2022-05-14 LAB — PROCALCITONIN: Procalcitonin: 0.62 ng/mL

## 2022-05-14 MED ORDER — ONDANSETRON HCL 4 MG/2ML IJ SOLN
4.0000 mg | Freq: Four times a day (QID) | INTRAMUSCULAR | Status: DC | PRN
  Administered 2022-05-14 – 2022-05-16 (×4): 4 mg via INTRAVENOUS
  Filled 2022-05-14 (×4): qty 2

## 2022-05-14 MED ORDER — ASPIRIN-ACETAMINOPHEN-CAFFEINE 250-250-65 MG PO TABS
1.0000 | ORAL_TABLET | Freq: Four times a day (QID) | ORAL | Status: DC | PRN
  Administered 2022-05-15: 1 via ORAL
  Filled 2022-05-14 (×2): qty 1

## 2022-05-14 MED ORDER — PANTOPRAZOLE SODIUM 20 MG PO TBEC
20.0000 mg | DELAYED_RELEASE_TABLET | Freq: Two times a day (BID) | ORAL | Status: DC
  Administered 2022-05-14 – 2022-05-16 (×5): 20 mg via ORAL
  Filled 2022-05-14 (×5): qty 1

## 2022-05-14 MED ORDER — IBUPROFEN 200 MG PO TABS
400.0000 mg | ORAL_TABLET | Freq: Four times a day (QID) | ORAL | Status: DC | PRN
  Administered 2022-05-14 (×2): 400 mg via ORAL
  Filled 2022-05-14 (×2): qty 2

## 2022-05-14 MED ORDER — ACETAMINOPHEN 325 MG PO TABS
650.0000 mg | ORAL_TABLET | Freq: Four times a day (QID) | ORAL | Status: DC | PRN
  Administered 2022-05-15: 650 mg via ORAL
  Filled 2022-05-14: qty 2

## 2022-05-14 MED ORDER — MELATONIN 3 MG PO TABS
3.0000 mg | ORAL_TABLET | Freq: Every evening | ORAL | Status: DC | PRN
  Administered 2022-05-14 – 2022-05-15 (×2): 3 mg via ORAL
  Filled 2022-05-14 (×2): qty 1

## 2022-05-14 MED ORDER — FENTANYL CITRATE PF 50 MCG/ML IJ SOSY
12.5000 ug | PREFILLED_SYRINGE | INTRAMUSCULAR | Status: DC | PRN
  Administered 2022-05-14 – 2022-05-15 (×10): 12.5 ug via INTRAVENOUS
  Filled 2022-05-14 (×10): qty 1

## 2022-05-14 MED ORDER — NALOXONE HCL 0.4 MG/ML IJ SOLN: 0.4000 mg | INTRAMUSCULAR | Status: DC | PRN

## 2022-05-14 MED ORDER — CYCLOBENZAPRINE HCL 10 MG PO TABS
10.0000 mg | ORAL_TABLET | Freq: Three times a day (TID) | ORAL | Status: DC | PRN
  Administered 2022-05-15: 10 mg via ORAL
  Filled 2022-05-14: qty 1

## 2022-05-14 MED ORDER — VENLAFAXINE HCL ER 75 MG PO CP24
150.0000 mg | ORAL_CAPSULE | Freq: Every day | ORAL | Status: DC
  Administered 2022-05-14 – 2022-05-16 (×3): 150 mg via ORAL
  Filled 2022-05-14 (×3): qty 2

## 2022-05-14 MED ORDER — LACTATED RINGERS IV BOLUS
1000.0000 mL | Freq: Once | INTRAVENOUS | Status: AC
  Administered 2022-05-14: 1000 mL via INTRAVENOUS

## 2022-05-14 MED ORDER — CARBAMAZEPINE 200 MG PO TABS
200.0000 mg | ORAL_TABLET | Freq: Two times a day (BID) | ORAL | Status: DC
  Administered 2022-05-14 – 2022-05-16 (×4): 200 mg via ORAL
  Filled 2022-05-14 (×6): qty 1

## 2022-05-14 MED ORDER — IOHEXOL 350 MG/ML SOLN
75.0000 mL | Freq: Once | INTRAVENOUS | Status: AC | PRN
  Administered 2022-05-14: 75 mL via INTRAVENOUS

## 2022-05-14 MED ORDER — ACETAMINOPHEN 650 MG RE SUPP: 650.0000 mg | Freq: Four times a day (QID) | RECTAL | Status: DC | PRN

## 2022-05-14 MED ORDER — HYDROCODONE-ACETAMINOPHEN 5-325 MG PO TABS
1.0000 | ORAL_TABLET | ORAL | Status: DC | PRN
  Administered 2022-05-14 – 2022-05-15 (×4): 1 via ORAL
  Filled 2022-05-14 (×5): qty 1

## 2022-05-14 MED ORDER — LACTATED RINGERS IV SOLN: INTRAVENOUS | Status: AC

## 2022-05-14 MED ORDER — SODIUM CHLORIDE 0.9 % IV SOLN
1.0000 g | INTRAVENOUS | Status: DC
  Administered 2022-05-14 – 2022-05-15 (×2): 1 g via INTRAVENOUS
  Filled 2022-05-14 (×2): qty 10

## 2022-05-14 NOTE — Progress Notes (Signed)
PROGRESS NOTE  Tasha Fernandez  DOB: 1970-05-07  PCP: Aviva KluverPcp, No [id number]  DOA: 05/13/2022  LOS: 0 days  Hospital Day: 2  Brief narrative: Tasha Fernandez is a 52 y.o. female with PMH significant for generalized anxiety disorder, generalized arthritis, migraine 4/5, patient presented to the ED with complaint of sharp right-sided abdominal discomfort radiating to right flank, foul-smelling urine, few episodes of vomiting, subjective fever, myalgia and poor oral intake.  Symptoms grossly worsening for 3 to 4 days.   In the ED, patient had a fever of 102.2, heart rate elevated to 120, blood pressure 148/81, breathing on room air. Initial labs with WC count elevated 17,000, lactic acid level normal, sodium low at 132 Urinalysis showed hazy yellow urine with large amount of hemoglobin, small leukocytes, many bacteria Blood culture and urine culture sent CT head unremarkable CT abdomen pelvis showed consistent with acute right pyelonephritis. Patient was started on IV antibiotics, IV fluid, IV antiemetics [facility]  Subjective: Patient was seen and examined this morning.  Pleasant middle-aged Caucasian female.  Lying on bed.  Feels better than at presentation.  No family at bedside.  No vomiting.  Wants to try regular diet. Chart reviewed Overnight, no recurrence of fever, blood pressure in low normal range  Assessment and plan: Sepsis due to Acute right-sided pyelonephritis -POA Presented with 3 to 4-day history of progressive abdominal pain, right flank pain, foul-smelling urine, fever Met sepsis criteria with fever, tachycardia Urinalysis with many bacteria CT abdomen with acute pyelonephritis Started on IV Rocephin, IV fluid No recurrence of fever overnight.  Monitor temperature and WBC trend Recent Labs  Lab 05/13/22 2043 05/14/22 0008 05/14/22 0202 05/14/22 0420  WBC 17.0*  --  16.0*  --   LATICACIDVEN  --  1.0 1.3  --   PROCALCITON  --   --   --  0.62    Elevated creatinine Creatinine mildly elevated 1.05 on presentation.  Likely due to dehydration from poor oral intake.  Creatinine elevation not enough to call AKI. Expect improvement with IV fluid  Recent Labs    12/04/21 1320 05/13/22 2043 05/14/22 0202  BUN 20 14 11   CREATININE 0.79 1.05* 0.96  CO2 29 25 24    Acute hyponatremia Likely due to poor oral intake.  Improving with IV fluid. Recent Labs  Lab 05/13/22 2043 05/14/22 0202  NA 132* 135   History of arthritis CT abdomen on admission showed moderate to marked severity degenerative changes at the level of L5-S1. PTA patient seems to be taking Goody's powder, Flexeril PRN, ibuprofen as needed  Migraine Continue Tegretol twice daily, Excedrin   Generalized anxiety disorder Continue Effexor  GERD Protonix     Mobility: Independent at baseline.  Encourage ambulation  Goals of care   Code Status: Full Code     DVT prophylaxis:  SCDs Start: 05/14/22 0110   Antimicrobials: IV Rocephin Fluid: LR at 150 mill per hour Consultants: None Family Communication: Family not at bedside  Status: Inpatient Level of care:  Progressive   Needs to continue in-hospital care:  IV antibiotics, IV fluid, culture report pending  Patient from: Home Anticipated d/c to: Hopefully Home in 2 to 3 days      Diet:  Diet Order             Diet regular Room service appropriate? Yes; Fluid consistency: Thin  Diet effective now  Scheduled Meds:  acetaminophen  1,000 mg Oral Once   pantoprazole  20 mg Oral BID    PRN meds: acetaminophen **OR** acetaminophen, fentaNYL (SUBLIMAZE) injection, melatonin, naLOXone (NARCAN)  injection, ondansetron (ZOFRAN) IV   Infusions:   cefTRIAXone (ROCEPHIN)  IV     lactated ringers 150 mL/hr at 05/14/22 0411    Antimicrobials: Anti-infectives (From admission, onward)    Start     Dose/Rate Route Frequency Ordered Stop   05/14/22 1800  cefTRIAXone  (ROCEPHIN) 1 g in sodium chloride 0.9 % 100 mL IVPB        1 g 200 mL/hr over 30 Minutes Intravenous Every 24 hours 05/14/22 0112     05/13/22 2230  cefTRIAXone (ROCEPHIN) 1 g in sodium chloride 0.9 % 100 mL IVPB        1 g 200 mL/hr over 30 Minutes Intravenous  Once 05/13/22 2224 05/13/22 2329   05/13/22 0000  cephALEXin (KEFLEX) 500 MG capsule        500 mg Oral 4 times daily 05/13/22 2314         Nutritional status:  Body mass index is 26.23 kg/m.          Objective: Vitals:   05/14/22 0330 05/14/22 0407  BP: 95/74 114/67  Pulse: 74 (!) 54  Resp: 14 16  Temp:  97.7 F (36.5 C)  SpO2: 99% 100%   No intake or output data in the 24 hours ending 05/14/22 0825 Filed Weights   05/14/22 0407  Weight: 72.6 kg   Weight change:  Body mass index is 26.23 kg/m.   Physical Exam: General exam: Pleasant, middle-aged Caucasian female.  Not in distress Skin: No rashes, lesions or ulcers. HEENT: Atraumatic, normocephalic, no obvious bleeding Lungs: Clear to auscultate bilaterally CVS: Regular rate and rhythm, no murmur GI/Abd soft, right-sided CVA tenderness present but improving, bowel sound present CNS: Alert, awake, oriented x 3 Psychiatry: Slightly anxious with the word ' sepsis' Extremities: No pedal edema, no calf tenderness  Data Review: I have personally reviewed the laboratory data and studies available.  F/u labs ordered Unresulted Labs (From admission, onward)     Start     Ordered   05/14/22 0232  Sodium, urine, random  Add-on,   AD        05/14/22 0231   05/14/22 0232  Creatinine, urine, random  Add-on,   AD        05/14/22 0231   05/14/22 0232  Osmolality, urine  Add-on,   AD        05/14/22 0231   05/13/22 2336  Blood culture (routine x 2)  BLOOD CULTURE X 2,   R      05/13/22 2335   05/13/22 2224  Urine Culture  Once,   URGENT       Question:  Indication  Answer:  Dysuria   05/13/22 2223            Total time spent in review of labs and  imaging, patient evaluation, formulation of plan, documentation and communication with family: 55 minutes  Signed, Lorin Glass, MD [facility] 05/14/2022

## 2022-05-14 NOTE — ED Notes (Signed)
ED TO INPATIENT HANDOFF REPORT  ED Nurse Name and Phone #: Khristy   S Name/Age/Gender Tasha NunneryStephanie S Fernandez 52 y.o. female Room/Bed: 021C/021C  Code Status   Code Status: Full Code  Home/SNF/Other Home Patient oriented to: self, place, time, and situation Is this baseline? Yes   Triage Complete: Triage complete  Chief Complaint Acute pyelonephritis [N10]  Triage Note Pt reports she has had abdominal pain and odorous urine. She also has a headache, nausea, vomiting, diarrhea. Pt was unaware that she had a fever.    Allergies Allergies  Allergen Reactions   Dilaudid [Hydromorphone]    Other     Bees    Level of Care/Admitting Diagnosis ED Disposition     ED Disposition  Admit   Condition  --   Comment  [facility] [[id number]]  Level of Care: Progressive [102]  Admit to Progressive based on following criteria: MULTISYSTEM THREATS such as stable sepsis, metabolic/electrolyte imbalance with or without encephalopathy that is responding to early treatment.  [facility] or [facility] if equivalent level of care is available:: No  Covid Evaluation: Asymptomatic - no recent exposure (last 10 days) testing not required  Diagnosis: Acute pyelonephritis [[id number]][[id number]]  Admitting Physician: Angie FavaHOWERTER, JUSTIN B [[id number]]  Attending Physician: Angie FavaHOWERTER, JUSTIN B [[id number]]  Certification:: I certify this patient will need inpatient services for at least 2 midnights  Estimated Length of Stay: 2          B Medical/Surgery History Past Medical History:  Diagnosis Date   Anxiety    Arthritis    Herniated disc    Seasonal allergies    TMJ (dislocation of temporomandibular joint)    Past Surgical History:  Procedure Laterality Date   CESAREAN SECTION     CHOLECYSTECTOMY     TUBAL LIGATION       A IV Location/Drains/Wounds Patient Lines/Drains/Airways Status     Active Line/Drains/Airways     Name Placement date  Placement time Site Days   Peripheral IV 05/13/22 20 G Anterior;Right Hand 05/13/22  2257  Hand  1            Intake/Output Last 24 hours No intake or output data in the 24 hours ending 05/14/22 0246  Labs/Imaging Results for orders placed or performed during the hospital encounter of 05/13/22 (from the past 48 hour(s))  CBC with Differential     Status: Abnormal   Collection Time: 05/13/22  8:43 PM  Result Value Ref Range   WBC 17.0 (H) 4.0 - 10.5 K/uL   RBC 4.25 3.87 - 5.11 MIL/uL   Hemoglobin 12.7 12.0 - 15.0 g/dL   HCT 29.539.2 62.136.0 - 30.846.0 %   MCV 92.2 80.0 - 100.0 fL   MCH 29.9 26.0 - 34.0 pg   MCHC 32.4 30.0 - 36.0 g/dL   RDW 65.712.2 84.611.5 - 96.215.5 %   Platelets 300 150 - 400 K/uL   nRBC 0.0 0.0 - 0.2 %   Neutrophils Relative % 75 %   Neutro Abs 12.9 (H) 1.7 - 7.7 K/uL   Lymphocytes Relative 12 %   Lymphs Abs 2.0 0.7 - 4.0 K/uL   Monocytes Relative 11 %   Monocytes Absolute 1.8 (H) 0.1 - 1.0 K/uL   Eosinophils Relative 0 %   Eosinophils Absolute 0.0 0.0 - 0.5 K/uL   Basophils Relative 1 %   Basophils Absolute 0.1 0.0 - 0.1 K/uL   Immature Granulocytes 1 %   Abs Immature  Granulocytes 0.11 (H) 0.00 - 0.07 K/uL    Comment: Performed at [facility], [address], [place], [place] [address]  Comprehensive metabolic panel     Status: Abnormal   Collection Time: 05/13/22  8:43 PM  Result Value Ref Range   Sodium 132 (L) 135 - 145 mmol/L   Potassium 3.8 3.5 - 5.1 mmol/L   Chloride 98 98 - 111 mmol/L   CO2 25 22 - 32 mmol/L   Glucose, Bld 117 (H) 70 - 99 mg/dL    Comment: Glucose reference range applies only to samples taken after fasting for at least 8 hours.   BUN 14 6 - 20 mg/dL   Creatinine, Ser 6.21 (H) 0.44 - 1.00 mg/dL   Calcium 8.5 (L) 8.9 - 10.3 mg/dL   Total Protein 6.9 6.5 - 8.1 g/dL   Albumin 3.4 (L) 3.5 - 5.0 g/dL   AST 36 15 - 41 U/L   ALT 59 (H) 0 - 44 U/L   Alkaline Phosphatase 63 38 - 126 U/L   Total Bilirubin 1.3 (H) 0.3 - 1.2 mg/dL   GFR,  Estimated >30 >86 mL/min    Comment: (NOTE) Calculated using the CKD-EPI Creatinine Equation (2021)    Anion gap 9 5 - 15    Comment: Performed at [facility], [address], [place], [place] [address]  Lipase, blood     Status: None   Collection Time: 05/13/22  8:43 PM  Result Value Ref Range   Lipase 25 11 - 51 U/L    Comment: Performed at [facility], [address], [place], [place] [address]  Urinalysis, Routine w reflex microscopic -Urine, Clean Catch     Status: Abnormal   Collection Time: 05/13/22  8:48 PM  Result Value Ref Range   Color, Urine YELLOW YELLOW   APPearance HAZY (A) CLEAR   Specific Gravity, Urine 1.025 1.005 - 1.030   pH 6.0 5.0 - 8.0   Glucose, UA NEGATIVE NEGATIVE mg/dL   Hgb urine dipstick LARGE (A) NEGATIVE   Bilirubin Urine NEGATIVE NEGATIVE   Ketones, ur NEGATIVE NEGATIVE mg/dL   Protein, ur 284 (A) NEGATIVE mg/dL   Nitrite NEGATIVE NEGATIVE   Leukocytes,Ua SMALL (A) NEGATIVE   RBC / HPF >50 0 - 5 RBC/hpf   WBC, UA >50 0 - 5 WBC/hpf   Bacteria, UA MANY (A) NONE SEEN   Squamous Epithelial / HPF 0-5 0 - 5 /HPF   Mucus PRESENT     Comment: Performed at [facility], [address], [place], [place] [address]  Lactic acid, plasma     Status: None   Collection Time: 05/14/22 12:08 AM  Result Value Ref Range   Lactic Acid, Venous 1.0 0.5 - 1.9 mmol/L    Comment: Performed at [facility], [address], [place], [place] [address]  CBC with Differential/Platelet     Status: Abnormal   Collection Time: 05/14/22  2:02 AM  Result Value Ref Range   WBC 16.0 (H) 4.0 - 10.5 K/uL   RBC 4.15 3.87 - 5.11 MIL/uL   Hemoglobin 12.6 12.0 - 15.0 g/dL   HCT 25.3 66.4 - 40.3 %   MCV 91.3 80.0 - 100.0 fL   MCH 30.4 26.0 - 34.0 pg   MCHC 33.2 30.0 - 36.0 g/dL   RDW 47.4 25.9 - 56.3 %   Platelets 272 150 - 400 K/uL   nRBC 0.0 0.0 - 0.2 %  Neutrophils Relative % 72 %   Neutro Abs 11.6 (H) 1.7 - 7.7 K/uL   Lymphocytes Relative 16 %    Lymphs Abs 2.6 0.7 - 4.0 K/uL   Monocytes Relative 10 %   Monocytes Absolute 1.7 (H) 0.1 - 1.0 K/uL   Eosinophils Relative 0 %   Eosinophils Absolute 0.0 0.0 - 0.5 K/uL   Basophils Relative 1 %   Basophils Absolute 0.1 0.0 - 0.1 K/uL   Immature Granulocytes 1 %   Abs Immature Granulocytes 0.10 (H) 0.00 - 0.07 K/uL    Comment: Performed at [facility], [address] [place], [place] [address]   CT Abdomen Pelvis W Contrast  Result Date: 05/14/2022 CLINICAL DATA:  Abdominal pain with fever and nausea. EXAM: CT ABDOMEN AND PELVIS WITH CONTRAST TECHNIQUE: Multidetector CT imaging of the abdomen and pelvis was performed using the standard protocol following bolus administration of intravenous contrast. RADIATION DOSE REDUCTION: This exam was performed according to the departmental dose-optimization program which includes automated exposure control, adjustment of the mA and/or kV according to patient size and/or use of iterative reconstruction technique. CONTRAST:  47mL OMNIPAQUE IOHEXOL 350 MG/ML SOLN COMPARISON:  March 14, 2021 FINDINGS: Lower chest: No acute abnormality. Hepatobiliary: No focal liver abnormality is seen. Status post cholecystectomy. No biliary dilatation. Pancreas: Unremarkable. No pancreatic ductal dilatation or surrounding inflammatory changes. Spleen: Normal in size without focal abnormality. Adrenals/Urinary Tract: Adrenal glands are unremarkable. Kidneys are normal in size, without renal calculi or hydronephrosis. Mild right-sided perinephric inflammatory fat stranding is seen. Ill-defined areas of parenchymal low attenuation are also seen within the mid and lower right kidney. Bladder is unremarkable. Stomach/Bowel: Stomach is within normal limits. The appendix is not clearly identified. Stool is seen throughout the large bowel. No evidence of bowel wall thickening, distention, or inflammatory changes. Vascular/Lymphatic: No significant vascular findings are present. No  enlarged abdominal or pelvic lymph nodes. Reproductive: Uterus and bilateral adnexa are unremarkable. Other: No abdominal wall hernia or abnormality. No abdominopelvic ascites. Musculoskeletal: Moderate to marked severity degenerative changes are seen at the level of L5-S1. IMPRESSION: 1. Findings consistent with acute pyelonephritis involving the right kidney. 2. Evidence of prior cholecystectomy. 3. Moderate to marked severity degenerative changes at the level of L5-S1. Electronically Signed   By: Aram Candela M.D.   On: 05/14/2022 00:17   CT Head Wo Contrast  Result Date: 05/13/2022 CLINICAL DATA:  Headaches for 3 days EXAM: CT HEAD WITHOUT CONTRAST TECHNIQUE: Contiguous axial images were obtained from the base of the skull through the vertex without intravenous contrast. RADIATION DOSE REDUCTION: This exam was performed according to the departmental dose-optimization program which includes automated exposure control, adjustment of the mA and/or kV according to patient size and/or use of iterative reconstruction technique. COMPARISON:  10/01/2009 FINDINGS: Brain: No evidence of acute infarction, hemorrhage, hydrocephalus, extra-axial collection or mass lesion/mass effect. Vascular: No hyperdense vessel or unexpected calcification. Skull: Normal. Negative for fracture or focal lesion. Sinuses/Orbits: No acute finding. Other: None. IMPRESSION: Mild motion artifact is noted.  No acute abnormality is seen. Electronically Signed   By: Alcide Clever M.D.   On: 05/13/2022 21:17    Pending Labs Unresulted Labs (From admission, onward)     Start     Ordered   05/14/22 0500  Comprehensive metabolic panel  Tomorrow morning,   R        05/14/22 0111   05/14/22 0232  Sodium, urine, random  Add-on,   AD  05/14/22 0231   05/14/22 0232  Creatinine, urine, random  Add-on,   AD        05/14/22 0231   05/14/22 0232  Osmolality, urine  Add-on,   AD        05/14/22 0231   05/14/22 0232  Osmolality  Add-on,    AD        05/14/22 0231   05/14/22 0232  TSH  Add-on,   AD        05/14/22 0231   05/14/22 0230  Procalcitonin  Add-on,   AD       References:    Procalcitonin Lower Respiratory Tract Infection AND Sepsis Procalcitonin Algorithm   05/14/22 0229   05/14/22 0112  Magnesium  Add-on,   AD        05/14/22 0111   05/13/22 2336  Lactic acid, plasma  Now then every 2 hours,   R,   Status:  Canceled      05/13/22 2335   05/13/22 2336  Blood culture (routine x 2)  BLOOD CULTURE X 2,   R      05/13/22 2335   05/13/22 2224  Urine Culture  Once,   URGENT       Question:  Indication  Answer:  Dysuria   05/13/22 2223            Vitals/Pain Today's Vitals   05/14/22 0000 05/14/22 0015 05/14/22 0100 05/14/22 0115  BP: 97/62 111/67 122/78 (!) 101/59  Pulse: 85 90 91 81  Resp: 18     Temp:      TempSrc:      SpO2: 99% 100% 100% 96%  PainSc:        Isolation Precautions No active isolations  Medications Medications  acetaminophen (TYLENOL) tablet 1,000 mg (1,000 mg Oral Not Given 05/13/22 2256)  acetaminophen (TYLENOL) tablet 650 mg (has no administration in time range)    Or  acetaminophen (TYLENOL) suppository 650 mg (has no administration in time range)  melatonin tablet 3 mg (has no administration in time range)  ondansetron (ZOFRAN) injection 4 mg (has no administration in time range)  lactated ringers infusion (has no administration in time range)  cefTRIAXone (ROCEPHIN) 1 g in sodium chloride 0.9 % 100 mL IVPB (has no administration in time range)  pantoprazole (PROTONIX) EC tablet 20 mg (has no administration in time range)  acetaminophen (TYLENOL) tablet 1,000 mg (1,000 mg Oral Given 05/13/22 2104)  ondansetron (ZOFRAN-ODT) disintegrating tablet 4 mg (4 mg Oral Given 05/13/22 2104)  cefTRIAXone (ROCEPHIN) 1 g in sodium chloride 0.9 % 100 mL IVPB (0 g Intravenous Stopped 05/13/22 2329)  lactated ringers bolus 1,000 mL (0 mLs Intravenous Stopped 05/14/22 0149)  lactated ringers bolus  1,000 mL (1,000 mLs Intravenous New Bag/Given 05/14/22 0149)  lactated ringers bolus 1,000 mL (1,000 mLs Intravenous New Bag/Given 05/13/22 2329)  iohexol (OMNIPAQUE) 350 MG/ML injection 75 mL (75 mLs Intravenous Contrast Given 05/14/22 0007)    Mobility walks     Focused Assessments     R Recommendations: See Admitting Provider Note  Report given to:   Additional Notes:

## 2022-05-14 NOTE — ED Notes (Signed)
BP 89/63 MD Howerter notified. Pt denies light headedness or dizziness.

## 2022-05-14 NOTE — ED Notes (Signed)
Per MD Howerter, provider to be made aware if MAP is below 65.

## 2022-05-14 NOTE — ED Provider Notes (Signed)
Care assumed patient with abdominal pain, urinary frequency, nausea and vomiting with concern for UTI and sepsis.  Blood pressure is soft in the 90s.  She is given IV fluid resuscitation and antibiotics.  CT scan pending.  CT scan shows evidence of pyelonephritis.  No obstructive uropathy.  No other explanation for patient's presentation.  Appears to have pyelonephritis and meet sepsis criteria.  Blood pressure soft in the 90s after 2 L of IV fluid.  Lactate was not sent initially and is pending.  Patient agreeable to admission for IV fluids and antibiotics.  Blood pressure improving to 111/67.  Additional IV fluids are given.\  Admission discussed with Dr. Kennis Carina, MD 05/14/22 [phone]

## 2022-05-14 NOTE — H&P (Signed)
History and Physical      Tasha NunneryStephanie S Toenjes ZOX:[id number] DOB: [birth year] DOA: 05/13/2022  PCP: Pcp, No (will further assess) Patient coming from: home   I have personally briefly reviewed patient's old medical [facility]  Chief Complaint: abd pain  HPI: Tasha Fernandez is a 52 y.o. female with medical history significant for generalized anxiety disorder, who is [facility]22 with severe sepsis due to right-sided pyelonephritis after presenting from home [facility] complaining of abdominal pain.   The patient reports she 3 days of new onset sharp right-sided abdominal discomfort radiating into the right flank.  She notes that this has been associated with new onset foul-smelling urine, but denies overt dysuria or gross hematuria over that timeframe.  She also notes intermittent nausea resulting in at least 2-3 episodes nonbloody, nonbilious emesis over the last 2 to 3 days.  She also conveys subjective fever over that timeframe, but denies any associated chills, full body rigors, or generalized myalgias.  She notes mild headache over that timeframe, but denies any neck stiffness, no rash.  Denies any diarrhea, melena, or hematochezia.  She also denies any recent cough, shortness of breath.  No recent chest pain, dizziness, diaphoresis, presyncope, or syncope.  In the context of her intermittent nausea/vomiting over the last few days, she conveys significant decline in oral intake over that timeframe.     ED Course:  Vital signs in the ED were notable for the following: Temperature max 102.2; initial heart rates in the 120s consistently decreasing into the range of the 80s to 90s following interval IV fluids, as further quantified below; initial blood pressure 92/46, increasing to 122/78 following 3 L LR bolus; respiratory rate 18-20, oxygen saturation 97 to 100% on room air.  Labs were notable for the following: CMP notable for the following:  Sodium 132 compared to most recent prior serum sodium level 132 in October 2023, bicarbonate 25, creatinine 1.05 relative to most recent prior serum creatinine data point of 0.79 on 12/04/2021, glucose 117, calcium, just for mild hypoalbuminemia noted to be 8.9, albumin 3.4, otherwise, liver enzymes within normal limits.  Lipase 25.  CBC notable for white cell count 17,000 with 75% neutrophils, hemoglobin 12.5.  Lactic acid 1.0.  Urinalysis associated with hazy appearing specimen and was notable for greater than 50 white blood cells, many bacteria, greater than 50 red blood cells, specific every 1.55, 100 protein.  Blood cultures x 2 as well as urine culture were collected prior to initiation of IV antibiotics.  Per my interpretation, EKG in ED demonstrated the following: Sinus tachycardia with heart rate 122, normal intervals, no evidence of T wave or ST changes, including no evidence of ST elevation.  Imaging and additional notable ED work-up: For evaluation of the patient's headache, noncontrast CT head was pursued, and per formal radiology read, showed no evidence of acute intracranial abnormality, including no evidence of intracranial hemorrhage or any evidence of acute infarct.  CT abdomen/pelvis with contrast, performed radiology read, demonstrated findings consistent with acute right-sided pyelonephritis, will demonstrating no evidence of additional acute intra-abdominal or acute intrapelvic process, including evidence of ureteral stone or any evidence of hydronephrosis.  While in the ED, the following were administered: Acetaminophen 1 g p.o. x 1, Zofran ODT 4 mg p.o. x 1, Rocephin, lactated Ringer's x 3 L bolus.  Subsequently, the patient was admitted for further evaluation management of presenting history of sepsis due to right-sided pyelonephritis, with  presentation also notable for dehydration, along with presenting labs were notable for acute hyponatremia.     Review of Systems: As per HPI  otherwise 10 point review of systems negative.   Past Medical History:  Diagnosis Date   Anxiety    Arthritis    Herniated disc    Seasonal allergies    TMJ (dislocation of temporomandibular joint)     Past Surgical History:  Procedure Laterality Date   CESAREAN SECTION     CHOLECYSTECTOMY     TUBAL LIGATION      Social History:  reports that she has never smoked. She has never used smokeless tobacco. She reports that she does not drink alcohol and does not use drugs.   Allergies  Allergen Reactions   Dilaudid [Hydromorphone]    Other     Bees    Family History  Problem Relation Age of Onset   Bursitis Mother     Family history reviewed and not pertinent    Prior to Admission medications   Medication Sig Start Date End Date Taking? Authorizing Provider  cephALEXin (KEFLEX) 500 MG capsule Take 1 capsule (500 mg total) by mouth 4 (four) times daily. 05/13/22  Yes Cathren LaineSteinl, Kevin, MD  ondansetron (ZOFRAN-ODT) 8 MG disintegrating tablet Take 1 tablet (8 mg total) by mouth every 8 (eight) hours as needed for nausea or vomiting. 05/13/22  Yes Cathren LaineSteinl, Kevin, MD  Aspirin-Acetaminophen (GOODYS BODY PAIN PO) Take 1 packet by mouth daily as needed (pain).    [provider]  aspirin-acetaminophen-caffeine (EXCEDRIN MIGRAINE) [id number] MG per tablet Take 1 tablet by mouth every 6 (six) hours as needed for pain.    [provider]  CarBAMazepine (TEGRETOL PO) Take 1 tablet by mouth 2 (two) times daily.    [provider]  cyclobenzaprine (FLEXERIL) 10 MG tablet Take 10 mg by mouth 3 (three) times daily as needed. Muscle spasms 04/30/13   [provider]  HYDROcodone-acetaminophen (NORCO/VICODIN) 5-325 MG per tablet Take 1-2 tablets by mouth every 6 (six) hours as needed for moderate pain (pain).    [provider]  HYDROcodone-acetaminophen (NORCO/VICODIN) 5-325 MG per tablet Take 1 tablet by mouth every 4 (four) hours as needed for moderate  pain. 06/19/13   Schinlever, Santina Evansatherine, PA-C  ibuprofen (ADVIL,MOTRIN) 200 MG tablet Take 400 mg by mouth every 6 (six) hours as needed for moderate pain.    [provider]  OnabotulinumtoxinA (BOTOX IJ) Inject as directed. Injection given at [facility] every 3 months    [provider]  pantoprazole (PROTONIX) 20 MG tablet Take 1 tablet (20 mg total) by mouth 2 (two) times daily. 03/14/21   Lorre NickAllen, Anthony, MD  venlafaxine XR (EFFEXOR-XR) 150 MG 24 hr capsule Take 150 mg by mouth daily. 05/16/13 05/16/14  [provider]  montelukast (SINGULAIR) 10 MG tablet Take 1 tablet (10 mg total) by mouth daily. 07/02/10 04/21/11  Tobey GrimWalden, Jeffrey H, MD     Objective    Physical Exam: Vitals:   05/14/22 0000 05/14/22 0015 05/14/22 0100 05/14/22 0115  BP: 97/62 111/67 122/78 (!) 101/59  Pulse: 85 90 91 81  Resp: 18     Temp:      TempSrc:      SpO2: 99% 100% 100% 96%    General: appears to be stated age; alert, oriented Skin: warm, dry, no rash Head:  AT/[place] Mouth:  Oral mucosa membranes appear dry, normal dentition Neck: supple; trachea midline Heart: Mildly tachycardic, but regular; did  not appreciate any M/R/G Lungs: CTAB, did not appreciate any wheezes, rales, or rhonchi Abdomen: + BS; soft, ND, NT Vascular: 2+ pedal pulses b/l; 2+ radial pulses b/l Extremities: no peripheral edema, no muscle wasting Neuro: strength and sensation intact in upper and lower extremities b/l      Labs on Admission: I have personally reviewed following labs and imaging studies  CBC: Recent Labs  Lab 05/13/22 2043  WBC 17.0*  NEUTROABS 12.9*  HGB 12.7  HCT 39.2  MCV 92.2  PLT 300   Basic Metabolic Panel: Recent Labs  Lab 05/13/22 2043  NA 132*  K 3.8  CL 98  CO2 25  GLUCOSE 117*  BUN 14  CREATININE 1.05*  CALCIUM 8.5*   GFR: CrCl cannot be calculated (Unknown ideal weight.). Liver Function Tests: Recent Labs  Lab 05/13/22 2043  AST 36  ALT 59*   ALKPHOS 63  BILITOT 1.3*  PROT 6.9  ALBUMIN 3.4*   Recent Labs  Lab 05/13/22 2043  LIPASE 25   No results for input(s): "AMMONIA" in the last 168 hours. Coagulation Profile: No results for input(s): "INR", "PROTIME" in the last 168 hours. Cardiac Enzymes: No results for input(s): "CKTOTAL", "CKMB", "CKMBINDEX", "TROPONINI" in the last 168 hours. BNP (last 3 results) No results for input(s): "PROBNP" in the last 8760 hours. HbA1C: No results for input(s): "HGBA1C" in the last 72 hours. CBG: No results for input(s): "GLUCAP" in the last 168 hours. Lipid Profile: No results for input(s): "CHOL", "HDL", "LDLCALC", "TRIG", "CHOLHDL", "LDLDIRECT" in the last 72 hours. Thyroid Function Tests: No results for input(s): "TSH", "T4TOTAL", "FREET4", "T3FREE", "THYROIDAB" in the last 72 hours. Anemia Panel: No results for input(s): "VITAMINB12", "FOLATE", "FERRITIN", "TIBC", "IRON", "RETICCTPCT" in the last 72 hours. Urine analysis:    Component Value Date/Time   COLORURINE YELLOW 05/13/2022 2048   APPEARANCEUR HAZY (A) 05/13/2022 2048   LABSPEC 1.025 05/13/2022 2048   PHURINE 6.0 05/13/2022 2048   GLUCOSEU NEGATIVE 05/13/2022 2048   HGBUR LARGE (A) 05/13/2022 2048   HGBUR small 01/11/2008 1051   BILIRUBINUR NEGATIVE 05/13/2022 2048   BILIRUBINUR NEG 10/14/2011 1106   KETONESUR NEGATIVE 05/13/2022 2048   PROTEINUR 100 (A) 05/13/2022 2048   UROBILINOGEN 0.2 01/14/2013 1811   NITRITE NEGATIVE 05/13/2022 2048   LEUKOCYTESUR SMALL (A) 05/13/2022 2048    Radiological Exams on Admission: CT Abdomen Pelvis W Contrast  Result Date: 05/14/2022 CLINICAL DATA:  Abdominal pain with fever and nausea. EXAM: CT ABDOMEN AND PELVIS WITH CONTRAST TECHNIQUE: Multidetector CT imaging of the abdomen and pelvis was performed using the standard protocol following bolus administration of intravenous contrast. RADIATION DOSE REDUCTION: This exam was performed according to the departmental  dose-optimization program which includes automated exposure control, adjustment of the mA and/or kV according to patient size and/or use of iterative reconstruction technique. CONTRAST:  75mL OMNIPAQUE IOHEXOL 350 MG/ML SOLN COMPARISON:  March 14, 2021 FINDINGS: Lower chest: No acute abnormality. Hepatobiliary: No focal liver abnormality is seen. Status post cholecystectomy. No biliary dilatation. Pancreas: Unremarkable. No pancreatic ductal dilatation or surrounding inflammatory changes. Spleen: Normal in size without focal abnormality. Adrenals/Urinary Tract: Adrenal glands are unremarkable. Kidneys are normal in size, without renal calculi or hydronephrosis. Mild right-sided perinephric inflammatory fat stranding is seen. Ill-defined areas of parenchymal low attenuation are also seen within the mid and lower right kidney. Bladder is unremarkable. Stomach/Bowel: Stomach is within normal limits. The appendix is not clearly identified. Stool is seen throughout the large bowel. No evidence of bowel  wall thickening, distention, or inflammatory changes. Vascular/Lymphatic: No significant vascular findings are present. No enlarged abdominal or pelvic lymph nodes. Reproductive: Uterus and bilateral adnexa are unremarkable. Other: No abdominal wall hernia or abnormality. No abdominopelvic ascites. Musculoskeletal: Moderate to marked severity degenerative changes are seen at the level of L5-S1. IMPRESSION: 1. Findings consistent with acute pyelonephritis involving the right kidney. 2. Evidence of prior cholecystectomy. 3. Moderate to marked severity degenerative changes at the level of L5-S1. Electronically Signed   By: Aram Candela M.D.   On: 05/14/2022 00:17   CT Head Wo Contrast  Result Date: 05/13/2022 CLINICAL DATA:  Headaches for 3 days EXAM: CT HEAD WITHOUT CONTRAST TECHNIQUE: Contiguous axial images were obtained from the base of the skull through the vertex without intravenous contrast. RADIATION DOSE  REDUCTION: This exam was performed according to the departmental dose-optimization program which includes automated exposure control, adjustment of the mA and/or kV according to patient size and/or use of iterative reconstruction technique. COMPARISON:  10/01/2009 FINDINGS: Brain: No evidence of acute infarction, hemorrhage, hydrocephalus, extra-axial collection or mass lesion/mass effect. Vascular: No hyperdense vessel or unexpected calcification. Skull: Normal. Negative for fracture or focal lesion. Sinuses/Orbits: No acute finding. Other: None. IMPRESSION: Mild motion artifact is noted.  No acute abnormality is seen. Electronically Signed   By: Alcide Clever M.D.   On: 05/13/2022 21:17      Assessment/Plan   Principal Problem:   Acute pyelonephritis Active Problems:   GAD (generalized anxiety disorder)   GERD (gastroesophageal reflux disease)   Severe sepsis   Acute hyponatremia   Dehydration   AKI (acute kidney injury)      #) Severe sepsis due to right-sided pyelonephritis: Diagnosis on the basis of 2 3 days of progressive right-sided abdominal pain radiating into the right flank associated nausea/vomiting, new onset foul-smelling urine, with presenting urinalysis suggestive of UTI in the setting of significant pyuria no evidence of squamous epithelial cells to suggest a contaminated specimen, while CT abdomen/pelvis shows findings consistent with acute right-sided pyelonephritis without any evidence of obstructing ureteral stone.  SIRS criteria met via objective fever, tachycardia. Lactic acid level: 1.0. Of note, given the associated presence of suspected end organ damage in the form of concominant presenting acute kidney injury, criteria are met for pt's sepsis to be considered severe in nature. Of noted, the patient has received 30 mL/kg IVF bolus in the ED this evening.  Her initial soft blood pressure has improved following interval IV fluids.  Additional ED work-up/management  notable for: Collection of blood cultures as well as urine culture followed by initiation of Rocephin, which will be continued.  No report of any acute respiratory symptoms to warrant pursuit of chest x-ray at this time.  Plan: CBC w/ diff and CMP in AM.  Follow for results of blood cx's x 2 and urine culture. Abx: Continue Rocephin.  Add on procalcitonin level.  Continuous lactated Ringer's at 150 cc/h x 12 hours.  As needed Zofran.  As needed acetaminophen.  Monitor on symmetry.            #) Acute hypo-osmolar  Hypovolemic  hyponatremia: Presenting serum sodium level 132 compared to most recent prior value of 139 in October 2023. Suspect an element of hypovolumeia, with suspected contribution from dehydration in the setting of clinical evidence of such as well as report of recent decline in oral intake coinciding with the timeframe of decline of serum sodium levels, concomitant with increased GI losses in the form of recent  nausea/vomiting.  Differential also includes the possibility of a contribution from SIADH. in general, will provide gentle IV fluids to attend to suspected contribution from dehydration, while further evaluating for any additional contributing factors, including SIADH, as further detailed below. Will also check TSH.  Outpatient medication list includes carbamazepine as well as Effexor. Of note, no evidence of associated acute focal neurologic deficits and no report of recent trauma, and presenting CT head shows no evidence of acute intra cranial process.  Plan: monitor strict I's and O's and daily weights.  check random urine sodium, urine osmolality.  Check serum osmolality to confirm suspected hypoosmolar etiology.  Repeat CMP in the morning. Check TSH.  IV fluids, as above.  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              #) Acute Kidney Injury:   as quantified above. Suspect that this is prerenal in nature d/t potential multifactorial contributions including dehydration in setting of recently diminished PO intake, N/V,  as well as suspected additional contribution to intravascular depletion from severe sepsis itself d/t associated increase in capillary permeability.  Presenting urinalysis with microscopy notable for greater than 50 red blood cells as well as 100 protein.  Of note, presenting CT abdomen/pelvis shows no evidence of postrenal obstructive source.  Plan: monitor strict I's & O's and daily weights. Attempt to avoid nephrotoxic agents. Refrain from NSAIDs. Repeat CMP in the morning. Check serum magnesium level. Add-on random urine sodium and random urine creatinine.  IV fluids, as above.  Further evaluation management of presenting severe sepsis due to acute right-sided pyelonephritis, as above.                 #) Dehydration: Clinical suspicion for such, including the appearance of dry oral mucous membranes as well as laboratory findings notable for UA demonstrating elevated specific gravity, while mild tachycardia is improving with IV fluids.  Appears to be in the setting of   recent increase in GI losses in the form of nausea/vomiting over the last 2 3 days,, with decline in oral intake over that timeframe.     Plan: Monitor strict I's and O's.  Daily weights.  CMP in the morning. IVF's, as above.  As needed IV Zofran.                #) Generalized anxiety disorder: documented h/o such. On Effexor as outpatient.  Will  hold Effexor for now in the setting of presenting acute hyponatremia.  Plan: Hold home Effexor for now, as above.               #) GERD: documented h/o such; on Protonix as outpatient.   Plan: continue home PPI.         DVT prophylaxis: SCD's   Code Status: Full code Family Communication: none Disposition Plan: Per Rounding Team Consults called: none;  Admission  status: Inpatient     I SPENT GREATER THAN 75  MINUTES IN CLINICAL CARE TIME/MEDICAL DECISION-MAKING IN COMPLETING THIS ADMISSION.      Chaney Born Rashmi Tallent DO [facility]  From 7PM - 7AM   05/14/2022, 2:33 AM

## 2022-05-15 DIAGNOSIS — N10 Acute tubulo-interstitial nephritis: Secondary | ICD-10-CM | POA: Diagnosis not present

## 2022-05-15 LAB — CULTURE, BLOOD (ROUTINE X 2)

## 2022-05-15 NOTE — Progress Notes (Signed)
PROGRESS NOTE  Tasha Fernandez  DOB: 1970-11-12  PCP: Tasha Fernandez [id number]:[id number]  DOA: 05/13/2022  LOS: 1 day  Hospital Day: 3  Brief narrative: Tasha Fernandez is a 52 y.o. female with PMH significant for generalized anxiety disorder, generalized arthritis, migraine 4/5, patient presented to the ED with complaint of sharp right-sided abdominal discomfort radiating to right flank, foul-smelling urine, few episodes of vomiting, subjective fever, myalgia and poor oral intake.  Symptoms grossly worsening for 3 to 4 days.   In the ED, patient had a fever of 102.2, heart rate elevated to 120, blood pressure 148/81, breathing on room air. Initial labs with WC count elevated 17,000, lactic acid level normal, sodium low at 132 Urinalysis showed hazy yellow urine with large amount of hemoglobin, small leukocytes, many bacteria Blood culture and urine culture sent CT head unremarkable CT abdomen pelvis showed consistent with acute right pyelonephritis. Patient was started on IV antibiotics, IV fluid, IV antiemetics Admitted to [facility]  Subjective: Patient was seen and examined this morning.  Sitting on bed.  Not in distress.  Pain gradually improving.  Family at bedside.  No recurrence of fever.  Assessment and plan: Sepsis due to Acute right-sided pyelonephritis -POA Presented with 3 to 4-day history of progressive abdominal pain, right flank pain, foul-smelling urine, fever Met sepsis criteria with fever, tachycardia Urinalysis with many bacteria CT abdomen with acute pyelonephritis Started on IV Rocephin, IV fluid No recurrence of fever in the last 24 hours.  Labs not available today.  Repeat tomorrow. Recent Labs  Lab 05/13/22 2043 05/14/22 0008 05/14/22 0202 05/14/22 0420  WBC 17.0*  --  16.0*  --   LATICACIDVEN  --  1.0 1.3  --   PROCALCITON  --   --   --  0.62    Elevated creatinine Creatinine improved with hydration  Recent Labs    12/04/21 1320 05/13/22 2043  05/14/22 0202  BUN 20 14 11   CREATININE 0.79 1.05* 0.96  CO2 29 25 24     Acute hyponatremia Likely due to poor oral intake.  Improving with IV fluid. Recent Labs  Lab 05/13/22 2043 05/14/22 0202  NA 132* 135    History of arthritis CT abdomen on admission showed moderate to marked severity degenerative changes at the level of L5-S1. PTA patient seems to be taking Goody's powder, Flexeril PRN, ibuprofen as needed Continue as needed pain meds  Migraine Continue Tegretol twice daily, Excedrin   Generalized anxiety disorder Continue Effexor  GERD Protonix     Mobility: Independent at baseline.  Encourage ambulation  Goals of care   Code Status: Full Code     DVT prophylaxis:  SCDs Start: 05/14/22 0110   Antimicrobials: IV Rocephin Fluid: LR at 150 mill per hour.  Appetite much better.  Okay to stop IV fluid today. Consultants: None Family Communication: Family at bedside  Status: Inpatient Level of care:  Progressive   Needs to continue in-hospital care:  IV antibiotics, IV fluid, culture report pending  Patient from: Home Anticipated d/c to: Hopefully Home tomorrow on oral antibiotics      Diet:  Diet Order             Diet regular Room service appropriate? Yes; Fluid consistency: Thin  Diet effective now                   Scheduled Meds:  acetaminophen  1,000 mg Oral Once   carbamazepine  200 mg Oral BID   pantoprazole  20 mg Oral BID   venlafaxine XR  150 mg Oral Daily    PRN meds: acetaminophen **OR** acetaminophen, aspirin-acetaminophen-caffeine, cyclobenzaprine, fentaNYL (SUBLIMAZE) injection, HYDROcodone-acetaminophen, ibuprofen, melatonin, naLOXone (NARCAN)  injection, ondansetron (ZOFRAN) IV   Infusions:   cefTRIAXone (ROCEPHIN)  IV Stopped (05/14/22 1851)    Antimicrobials: Anti-infectives (From admission, onward)    Start     Dose/Rate Route Frequency Ordered Stop   05/14/22 1800  cefTRIAXone (ROCEPHIN) 1 g in sodium  chloride 0.9 % 100 mL IVPB        1 g 200 mL/hr over 30 Minutes Intravenous Every 24 hours 05/14/22 0112     05/13/22 2230  cefTRIAXone (ROCEPHIN) 1 g in sodium chloride 0.9 % 100 mL IVPB        1 g 200 mL/hr over 30 Minutes Intravenous  Once 05/13/22 2224 05/13/22 2329   05/13/22 0000  cephALEXin (KEFLEX) 500 MG capsule        500 mg Oral 4 times daily 05/13/22 2314         Nutritional status:  Body mass index is 26.23 kg/m.          Objective: Vitals:   05/15/22 0803 05/15/22 1137  BP: 128/85 109/71  Pulse: 82 98  Resp: 18 18  Temp: (!) 97.5 F (36.4 C) 97.6 F (36.4 C)  SpO2: 99% 96%    Intake/Output Summary (Last 24 hours) at 05/15/2022 1352 Last data filed at 05/15/2022 0222 Gross per 24 hour  Intake 100 ml  Output --  Net 100 ml   Filed Weights   05/14/22 0407  Weight: 72.6 kg   Weight change:  Body mass index is 26.23 kg/m.   Physical Exam: General exam: Pleasant, middle-aged Caucasian female.  Not in distress Skin: No rashes, lesions or ulcers. HEENT: Atraumatic, normocephalic, no obvious bleeding Lungs: Clear to auscultate bilaterally CVS: Regular rate and rhythm, no murmur GI/Abd soft, right-sided CVA tenderness much better, bowel sound present CNS: Alert, awake, oriented x 3 Psychiatry: Still anxious with the word ' sepsis' Extremities: No pedal edema, no calf tenderness  Data Review: I have personally reviewed the laboratory data and studies available.  F/u labs ordered Unresulted Labs (From admission, onward)     Start     Ordered   05/16/22 0500  Basic metabolic panel  Tomorrow morning,   R       Question:  Specimen collection method  Answer:  Lab=Lab collect   05/15/22 0827   05/16/22 0500  CBC with Differential/Platelet  Tomorrow morning,   R       Question:  Specimen collection method  Answer:  Lab=Lab collect   05/15/22 0827            Total time spent in review of labs and imaging, patient evaluation, formulation of plan,  documentation and communication with family: 45 minutes  Signed, Lorin Glass, MD [facility] 05/15/2022

## 2022-05-16 ENCOUNTER — Other Ambulatory Visit (HOSPITAL_COMMUNITY): Payer: Self-pay

## 2022-05-16 DIAGNOSIS — N10 Acute tubulo-interstitial nephritis: Secondary | ICD-10-CM | POA: Diagnosis not present

## 2022-05-16 LAB — CBC WITH DIFFERENTIAL/PLATELET
Abs Immature Granulocytes: 0.04 10*3/uL (ref 0.00–0.07)
Basophils Absolute: 0.1 10*3/uL (ref 0.0–0.1)
Basophils Relative: 1 %
Eosinophils Absolute: 0.3 10*3/uL (ref 0.0–0.5)
Eosinophils Relative: 4 %
HCT: 34.7 % — ABNORMAL LOW (ref 36.0–46.0)
Hemoglobin: 11.2 g/dL — ABNORMAL LOW (ref 12.0–15.0)
Immature Granulocytes: 0 %
Lymphocytes Relative: 19 %
Lymphs Abs: 1.7 10*3/uL (ref 0.7–4.0)
MCH: 29.9 pg (ref 26.0–34.0)
MCHC: 32.3 g/dL (ref 30.0–36.0)
MCV: 92.5 fL (ref 80.0–100.0)
Monocytes Absolute: 0.8 10*3/uL (ref 0.1–1.0)
Monocytes Relative: 9 %
Neutro Abs: 6 10*3/uL (ref 1.7–7.7)
Neutrophils Relative %: 67 %
Platelets: 275 10*3/uL (ref 150–400)
RBC: 3.75 MIL/uL — ABNORMAL LOW (ref 3.87–5.11)
RDW: 12.3 % (ref 11.5–15.5)
WBC: 8.9 10*3/uL (ref 4.0–10.5)
nRBC: 0 % (ref 0.0–0.2)

## 2022-05-16 LAB — BASIC METABOLIC PANEL
Anion gap: 5 (ref 5–15)
BUN: 8 mg/dL (ref 6–20)
CO2: 28 mmol/L (ref 22–32)
Calcium: 8 mg/dL — ABNORMAL LOW (ref 8.9–10.3)
Chloride: 101 mmol/L (ref 98–111)
Creatinine, Ser: 0.76 mg/dL (ref 0.44–1.00)
GFR, Estimated: >60 mL/min (ref >60)
Glucose, Bld: 101 mg/dL — ABNORMAL HIGH (ref 70–99)
Potassium: 4.5 mmol/L (ref 3.5–5.1)
Sodium: 134 mmol/L — ABNORMAL LOW (ref 135–145)

## 2022-05-16 LAB — URINE CULTURE: Culture: >=100000 — AB

## 2022-05-16 LAB — CULTURE, BLOOD (ROUTINE X 2): Culture: NO GROWTH

## 2022-05-16 MED ORDER — CEPHALEXIN 500 MG PO CAPS: 500.0000 mg | ORAL_CAPSULE | Freq: Three times a day (TID) | ORAL | Status: DC

## 2022-05-16 MED ORDER — SACCHAROMYCES BOULARDII 250 MG PO CAPS
250.0000 mg | ORAL_CAPSULE | Freq: Two times a day (BID) | ORAL | 0 refills | Status: AC
  Filled 2022-05-16: qty 14, 7d supply, fill #0

## 2022-05-16 MED ORDER — ONDANSETRON 4 MG PO TBDP
ORAL_TABLET | ORAL | Status: AC
  Administered 2022-05-16: 4 mg
  Filled 2022-05-16: qty 1

## 2022-05-16 MED ORDER — ONDANSETRON HCL 4 MG PO TABS: 4.0000 mg | ORAL_TABLET | Freq: Four times a day (QID) | ORAL | Status: DC | PRN

## 2022-05-16 MED ORDER — CEFDINIR 300 MG PO CAPS
300.0000 mg | ORAL_CAPSULE | Freq: Two times a day (BID) | ORAL | 0 refills | Status: AC
  Filled 2022-05-16: qty 14, 7d supply, fill #0

## 2022-05-16 MED ORDER — CEFDINIR 300 MG PO CAPS
300.0000 mg | ORAL_CAPSULE | Freq: Two times a day (BID) | ORAL | Status: DC
  Filled 2022-05-16: qty 1

## 2022-05-16 MED ORDER — CEFDINIR 300 MG PO CAPS
300.0000 mg | ORAL_CAPSULE | Freq: Two times a day (BID) | ORAL | Status: DC
  Administered 2022-05-16: 300 mg via ORAL
  Filled 2022-05-16 (×2): qty 1

## 2022-05-16 NOTE — Discharge Summary (Signed)
Physician Discharge Summary  Tasha Fernandez ZOX:[id number] DOB: 1970/07/11 DOA: 05/13/2022  PCP: Pcp, No  Admit date: 05/13/2022 Discharge date: 05/16/2022  Admitted From: Home Discharge disposition: Home  Recommendations at discharge:  Complete the course of antibiotics with probiotics as advised.   Brief narrative: Tasha Fernandez is a 52 y.o. female with PMH significant for generalized anxiety disorder, generalized arthritis, migraine 4/5, patient presented to the ED with complaint of sharp right-sided abdominal discomfort radiating to right flank, foul-smelling urine, few episodes of vomiting, subjective fever, myalgia and poor oral intake.  Symptoms grossly worsening for 3 to 4 days.   In the ED, patient had a fever of 102.2, heart rate elevated to 120, blood pressure 148/81, breathing on room air. Initial labs with WC count elevated 17,000, lactic acid level normal, sodium low at 132 Urinalysis showed hazy yellow urine with large amount of hemoglobin, small leukocytes, many bacteria Blood culture and urine culture sent CT head unremarkable CT abdomen pelvis showed consistent with acute right pyelonephritis. Patient was started on IV antibiotics, IV fluid, IV antiemetics Admitted to [facility]  Subjective: Patient was seen and examined this morning.  Propped up in bed.  Not in distress.  No new symptoms.  Pain better.  No fever.  Feels ready for discharge.  Hospital course: Sepsis due to Acute right-sided pyelonephritis -POA E. coli in urine culture Presented with 3 to 4-day history of progressive abdominal pain, right flank pain, foul-smelling urine, fever Met sepsis criteria with fever, tachycardia Urinalysis with many bacteria CT abdomen with acute pyelonephritis She was initially started on IV Rocephin.  Clinically improved.  No recurrence of fever. Urine culture is growing E. coli pansensitive.  Will discharge on 7 more days of Omnicef with probiotics Recent Labs  Lab  05/13/22 2043 05/14/22 0008 05/14/22 0202 05/14/22 0420 05/16/22 0432  WBC 17.0*  --  16.0*  --  8.9  LATICACIDVEN  --  1.0 1.3  --   --   PROCALCITON  --   --   --  0.62  --    Elevated creatinine Creatinine improved with hydration  Recent Labs    12/04/21 1320 05/13/22 2043 05/14/22 0202 05/16/22 0432  BUN 20 14 11 8   CREATININE 0.79 1.05* 0.96 0.76  CO2 29 25 24 28    Acute hyponatremia Likely due to poor oral intake.  Improving with IV fluid.  History of arthritis CT abdomen on admission showed moderate to marked severity degenerative changes at the level of L5-S1. PTA patient seems to be taking Goody's powder, Flexeril PRN, ibuprofen as needed Continue as needed pain meds  Migraine Continue Tegretol twice daily, Excedrin   Generalized anxiety disorder Continue Effexor  GERD Protonix     Mobility: Independent at baseline.  Encourage ambulation  Goals of care   Code Status: Full Code   Wounds:  -    Discharge Exam:   Vitals:   05/15/22 1958 05/16/22 0000 05/16/22 0920 05/16/22 1002  BP: (!) 143/87 (!) 142/83 (!) 130/95 137/89  Pulse: 92 74 73 83  Resp: 18 16 18 17   Temp: 97.7 F (36.5 C) (!) 97.5 F (36.4 C) 97.7 F (36.5 C)   TempSrc: Axillary Oral    SpO2: 100% 100% 96% 100%  Weight:        Body mass index is 26.23 kg/m.  General exam: Pleasant, middle-aged Caucasian female.  Not in distress Skin: No rashes, lesions or ulcers. HEENT: Atraumatic, normocephalic, no obvious bleeding Lungs: Clear to auscultation  bilaterally CVS: Regular rate and rhythm, no murmur GI/Abd soft, right-sided CVA tenderness much better, bowel sound present CNS: Alert, awake, oriented x 3. Psychiatry: Mood appropriate Extremities: No pedal edema, no calf tenderness  Follow ups:    Follow-up Information     [facility] Follow up.   Contact information: [address] [place]  [id number] [phone]                Discharge Instructions:   Discharge Instructions     Call MD for:  difficulty breathing, headache or visual disturbances   Complete by: As directed    Call MD for:  extreme fatigue   Complete by: As directed    Call MD for:  hives   Complete by: As directed    Call MD for:  persistant dizziness or light-headedness   Complete by: As directed    Call MD for:  persistant nausea and vomiting   Complete by: As directed    Call MD for:  severe uncontrolled pain   Complete by: As directed    Call MD for:  temperature >100.4   Complete by: As directed    Diet general   Complete by: As directed    Discharge instructions   Complete by: As directed    Recommendations at discharge:   Complete the course of antibiotics with probiotics as advised.  General discharge instructions: Follow with Primary MD Pcp, No in 7 days  Please request your PCP  to go over your hospital tests, procedures, radiology results at the follow up. Please get your medicines reviewed and adjusted.  Your PCP may decide to repeat certain labs or tests as needed. Do not drive, operate heavy machinery, perform activities at heights, swimming or participation in water activities or provide baby sitting services if your were admitted for syncope or siezures until you have seen by Primary MD or a Neurologist and advised to do so again. [place] Controlled Substance Reporting System database was reviewed. Do not drive, operate heavy machinery, perform activities at heights, swim, participate in water activities or provide baby-sitting services while on medications for pain, sleep and mood until your outpatient physician has reevaluated you and advised to do so again.  You are strongly recommended to comply with the dose, frequency and duration of prescribed medications. Activity: As tolerated with Full fall precautions use walker/cane & assistance as needed Avoid using any  recreational substances like cigarette, tobacco, alcohol, or non-prescribed drug. If you experience worsening of your admission symptoms, develop shortness of breath, life threatening emergency, suicidal or homicidal thoughts you must seek medical attention immediately by calling 911 or calling your MD immediately  if symptoms less severe. You must read complete instructions/literature along with all the possible adverse reactions/side effects for all the medicines you take and that have been prescribed to you. Take any new medicine only after you have completely understood and accepted all the possible adverse reactions/side effects.  Wear Seat belts while driving. You were cared for by a hospitalist during your hospital stay. If you have any questions about your discharge medications or the care you received while you were in the hospital after you are discharged, you can call the unit and ask to speak with the hospitalist or the covering physician. Once you are discharged, your primary care physician will handle any further medical issues. Please note that NO REFILLS for any discharge medications will be authorized once you are  discharged, as it is imperative that you return to your primary care physician (or establish a relationship with a primary care physician if you do not have one).   Increase activity slowly   Complete by: As directed        Discharge Medications:   Allergies as of 05/16/2022       Reactions   Dilaudid [hydromorphone]    Other    Bees        Medication List     STOP taking these medications    cephALEXin 500 MG capsule Commonly known as: KEFLEX       TAKE these medications    aspirin-acetaminophen-caffeine 250-250-65 MG tablet Commonly known as: EXCEDRIN MIGRAINE Take 1 tablet by mouth every 6 (six) hours as needed for pain.   BOTOX IJ Inject as directed. Injection given at [facility] every 3 months   cefdinir 300 MG capsule Commonly known  as: OMNICEF Take 1 capsule (300 mg total) by mouth every 12 (twelve) hours for 7 days.   cyclobenzaprine 10 MG tablet Commonly known as: FLEXERIL Take 10 mg by mouth 3 (three) times daily as needed. Muscle spasms   GOODYS BODY PAIN PO Take 1 packet by mouth daily as needed (pain).   HYDROcodone-acetaminophen 5-325 MG tablet Commonly known as: NORCO/VICODIN Take 1 tablet by mouth every 4 (four) hours as needed for moderate pain.   HYDROcodone-acetaminophen 5-325 MG tablet Commonly known as: NORCO/VICODIN Take 1-2 tablets by mouth every 6 (six) hours as needed for moderate pain (pain).   ibuprofen 200 MG tablet Commonly known as: ADVIL Take 400 mg by mouth every 6 (six) hours as needed for moderate pain.   ondansetron 8 MG disintegrating tablet Commonly known as: ZOFRAN-ODT Take 1 tablet (8 mg total) by mouth every 8 (eight) hours as needed for nausea or vomiting.   pantoprazole 20 MG tablet Commonly known as: PROTONIX Take 1 tablet (20 mg total) by mouth 2 (two) times daily.   saccharomyces boulardii 250 MG capsule Commonly known as: FLORASTOR Take 1 capsule (250 mg total) by mouth 2 (two) times daily for 7 days.   TEGRETOL PO Take 1 tablet by mouth 2 (two) times daily.   venlafaxine XR 150 MG 24 hr capsule Commonly known as: EFFEXOR-XR Take 150 mg by mouth daily.         The results of significant diagnostics from this hospitalization (including imaging, microbiology, ancillary and laboratory) are listed below for reference.    Procedures and Diagnostic Studies:   CT Abdomen Pelvis W Contrast  Result Date: 05/14/2022 CLINICAL DATA:  Abdominal pain with fever and nausea. EXAM: CT ABDOMEN AND PELVIS WITH CONTRAST TECHNIQUE: Multidetector CT imaging of the abdomen and pelvis was performed using the standard protocol following bolus administration of intravenous contrast. RADIATION DOSE REDUCTION: This exam was performed according to the departmental dose-optimization  program which includes automated exposure control, adjustment of the mA and/or kV according to patient size and/or use of iterative reconstruction technique. CONTRAST:  75mL OMNIPAQUE IOHEXOL 350 MG/ML SOLN COMPARISON:  March 14, 2021 FINDINGS: Lower chest: No acute abnormality. Hepatobiliary: No focal liver abnormality is seen. Status post cholecystectomy. No biliary dilatation. Pancreas: Unremarkable. No pancreatic ductal dilatation or surrounding inflammatory changes. Spleen: Normal in size without focal abnormality. Adrenals/Urinary Tract: Adrenal glands are unremarkable. Kidneys are normal in size, without renal calculi or hydronephrosis. Mild right-sided perinephric inflammatory fat stranding is seen. Ill-defined areas of parenchymal low attenuation are also seen within the mid and  lower right kidney. Bladder is unremarkable. Stomach/Bowel: Stomach is within normal limits. The appendix is not clearly identified. Stool is seen throughout the large bowel. No evidence of bowel wall thickening, distention, or inflammatory changes. Vascular/Lymphatic: No significant vascular findings are present. No enlarged abdominal or pelvic lymph nodes. Reproductive: Uterus and bilateral adnexa are unremarkable. Other: No abdominal wall hernia or abnormality. No abdominopelvic ascites. Musculoskeletal: Moderate to marked severity degenerative changes are seen at the level of L5-S1. IMPRESSION: 1. Findings consistent with acute pyelonephritis involving the right kidney. 2. Evidence of prior cholecystectomy. 3. Moderate to marked severity degenerative changes at the level of L5-S1. Electronically Signed   By: Aram Candela M.D.   On: 05/14/2022 00:17   CT Head Wo Contrast  Result Date: 05/13/2022 CLINICAL DATA:  Headaches for 3 days EXAM: CT HEAD WITHOUT CONTRAST TECHNIQUE: Contiguous axial images were obtained from the base of the skull through the vertex without intravenous contrast. RADIATION DOSE REDUCTION: This exam  was performed according to the departmental dose-optimization program which includes automated exposure control, adjustment of the mA and/or kV according to patient size and/or use of iterative reconstruction technique. COMPARISON:  10/01/2009 FINDINGS: Brain: No evidence of acute infarction, hemorrhage, hydrocephalus, extra-axial collection or mass lesion/mass effect. Vascular: No hyperdense vessel or unexpected calcification. Skull: Normal. Negative for fracture or focal lesion. Sinuses/Orbits: No acute finding. Other: None. IMPRESSION: Mild motion artifact is noted.  No acute abnormality is seen. Electronically Signed   By: Alcide Clever M.D.   On: 05/13/2022 21:17     Labs:   Basic Metabolic Panel: Recent Labs  Lab 05/13/22 2043 05/14/22 0202 05/16/22 0432  NA 132* 135 134*  K 3.8 3.7 4.5  CL 98 102 101  CO2 25 24 28   GLUCOSE 117* 113* 101*  BUN 14 11 8   CREATININE 1.05* 0.96 0.76  CALCIUM 8.5* 8.4* 8.0*  MG  --  1.9  --    GFR CrCl cannot be calculated (Unknown ideal weight.). Liver Function Tests: Recent Labs  Lab 05/13/22 2043 05/14/22 0202  AST 36 33  ALT 59* 53*  ALKPHOS 63 57  BILITOT 1.3* 1.1  PROT 6.9 6.4*  ALBUMIN 3.4* 3.0*   Recent Labs  Lab 05/13/22 2043  LIPASE 25   No results for input(s): "AMMONIA" in the last 168 hours. Coagulation profile No results for input(s): "INR", "PROTIME" in the last 168 hours.  CBC: Recent Labs  Lab 05/13/22 2043 05/14/22 0202 05/16/22 0432  WBC 17.0* 16.0* 8.9  NEUTROABS 12.9* 11.6* 6.0  HGB 12.7 12.6 11.2*  HCT 39.2 37.9 34.7*  MCV 92.2 91.3 92.5  PLT 300 272 275   Cardiac Enzymes: No results for input(s): "CKTOTAL", "CKMB", "CKMBINDEX", "TROPONINI" in the last 168 hours. BNP: Invalid input(s): "POCBNP" CBG: No results for input(s): "GLUCAP" in the last 168 hours. D-Dimer No results for input(s): "DDIMER" in the last 72 hours. Hgb A1c No results for input(s): "HGBA1C" in the last 72 hours. Lipid  Profile No results for input(s): "CHOL", "HDL", "LDLCALC", "TRIG", "CHOLHDL", "LDLDIRECT" in the last 72 hours. Thyroid function studies Recent Labs    05/14/22 0420  TSH 4.522*   Anemia work up No results for input(s): "VITAMINB12", "FOLATE", "FERRITIN", "TIBC", "IRON", "RETICCTPCT" in the last 72 hours. Microbiology Recent Results (from the past 240 hour(s))  Urine Culture     Status: Abnormal   Collection Time: 05/13/22  8:48 PM   Specimen: Urine, Clean Catch  Result Value Ref Range Status   Specimen  Description URINE, CLEAN CATCH  Final   Special Requests   Final    NONE Performed at [facility], [address] [place], [place] [address]    Culture >=100,000 COLONIES/mL ESCHERICHIA COLI (A)  Final   Report Status 05/16/2022 FINAL  Final   Organism ID, Bacteria ESCHERICHIA COLI (A)  Final      Susceptibility   Escherichia coli - MIC*    AMPICILLIN <=2 SENSITIVE Sensitive     CEFAZOLIN <=4 SENSITIVE Sensitive     CEFEPIME <=0.12 SENSITIVE Sensitive     CEFTRIAXONE <=0.25 SENSITIVE Sensitive     CIPROFLOXACIN <=0.25 SENSITIVE Sensitive     GENTAMICIN <=1 SENSITIVE Sensitive     IMIPENEM <=0.25 SENSITIVE Sensitive     NITROFURANTOIN <=16 SENSITIVE Sensitive     TRIMETH/SULFA <=20 SENSITIVE Sensitive     AMPICILLIN/SULBACTAM <=2 SENSITIVE Sensitive     PIP/TAZO <=4 SENSITIVE Sensitive     * >=100,000 COLONIES/mL ESCHERICHIA COLI  Blood culture (routine x 2)     Status: None (Preliminary result)   Collection Time: 05/13/22 11:41 PM   Specimen: BLOOD RIGHT HAND  Result Value Ref Range Status   Specimen Description BLOOD RIGHT HAND  Final   Special Requests   Final    BOTTLES DRAWN AEROBIC AND ANAEROBIC Blood Culture results may not be optimal due to an inadequate volume of blood received in culture bottles   Culture   Final    NO GROWTH 2 DAYS Performed at [facility], [address] [place], [place] [address]    Report Status PENDING  Incomplete  Blood  culture (routine x 2)     Status: None (Preliminary result)   Collection Time: 05/14/22 12:08 AM   Specimen: BLOOD LEFT HAND  Result Value Ref Range Status   Specimen Description BLOOD LEFT HAND  Final   Special Requests   Final    BOTTLES DRAWN AEROBIC AND ANAEROBIC Blood Culture results may not be optimal due to an inadequate volume of blood received in culture bottles   Culture   Final    NO GROWTH 2 DAYS Performed at [facility], [address] [place], [place] [address]    Report Status PENDING  Incomplete    Time coordinating discharge: 45 minutes  Signed: Zipporah Finamore  [facility] 05/16/2022, 10:31 AM

## 2022-05-16 NOTE — TOC Transition Note (Signed)
Transition of Care [facility]) - CM/SW Discharge Note   Patient Details  Name: HUNTLEIGH PEKRUL MRN: [id number] Date of Birth: 10-14-1970  Transition of Care [facility]) CM/SW Contact:  Kermit Balo, RN Phone Number: 05/16/2022, 12:24 PM   Clinical Narrative:    Pt is discharging home with self care. Pt states she has a PCP in [facility] but hasn't seen them in a long time. CM has placed [facility] information on her AVS for her to schedule an appointment.  Pt has transportation home.   Final next level of care: Home/Self Care Barriers to Discharge: No Barriers Identified   Patient Goals and CMS Choice      Discharge Placement                         Discharge Plan and Services Additional resources added to the After Visit Summary for                                       Social Determinants of Health (SDOH) Interventions SDOH Screenings   Food Insecurity: No Food Insecurity (05/16/2022)  Housing: Low Risk  (05/16/2022)  Transportation Needs: No Transportation Needs (05/16/2022)  Utilities: Not At Risk (05/16/2022)  Tobacco Use: Low Risk  (05/14/2022)     Readmission Risk Interventions     No data to display

## 2022-05-16 NOTE — Plan of Care (Signed)

## 2022-05-16 NOTE — Progress Notes (Signed)
This nurse at pt's bedside taking VS, pt appears over medicated/drowsy. VSS, last dose of Vicodin given at 1747. Pt has only received pantoprazole 20 mg, PO this shift. Pt denies taking any home/personal medications at this time. Boyfriend not at bedside during this conversation but returned prior to this nurse leaving room. Will continue to monitor.

## 2022-05-17 ENCOUNTER — Other Ambulatory Visit (HOSPITAL_COMMUNITY): Payer: Self-pay

## 2022-05-17 LAB — CULTURE, BLOOD (ROUTINE X 2)

## 2022-05-18 LAB — CULTURE, BLOOD (ROUTINE X 2)

## 2022-05-18 NOTE — Progress Notes (Signed)
Pt discharge education completed at this time. Discussed UTI symptoms and ways to PREVENT UTI's in great detail with pt and pt's visitor. Abx admin and florastor admin instructions discussed in great detail as well. Pt acknowledged via teach back method. All other questions concerns answered. Emotional support provided.

## 2022-05-19 LAB — CULTURE, BLOOD (ROUTINE X 2)

## 2022-05-30 ENCOUNTER — Ambulatory Visit: Payer: Medicaid Other

## 2022-06-17 ENCOUNTER — Encounter (HOSPITAL_COMMUNITY): Payer: Self-pay

## 2022-06-17 ENCOUNTER — Ambulatory Visit (HOSPITAL_COMMUNITY)
Admission: EM | Admit: 2022-06-17 | Discharge: 2022-06-17 | Disposition: A | Discharge status: Home / Self Care | Payer: Medicaid Other | Attending: Family Medicine | Admitting: Family Medicine

## 2022-06-17 DIAGNOSIS — R350 Frequency of micturition: Secondary | ICD-10-CM | POA: Insufficient documentation

## 2022-06-17 DIAGNOSIS — H6692 Otitis media, unspecified, left ear: Secondary | ICD-10-CM | POA: Insufficient documentation

## 2022-06-17 DIAGNOSIS — J069 Acute upper respiratory infection, unspecified: Secondary | ICD-10-CM

## 2022-06-17 LAB — POCT URINALYSIS DIP (MANUAL ENTRY)
Bilirubin, UA: NEGATIVE
Glucose, UA: NEGATIVE mg/dL
Ketones, POC UA: NEGATIVE mg/dL
Nitrite, UA: NEGATIVE
Protein Ur, POC: NEGATIVE mg/dL
Spec Grav, UA: >=1.03 — AB (ref 1.010–1.025)
Urobilinogen, UA: 0.2 E.U./dL
pH, UA: 7 (ref 5.0–8.0)

## 2022-06-17 MED ORDER — BENZONATATE 100 MG PO CAPS: 100.0000 mg | ORAL_CAPSULE | Freq: Three times a day (TID) | ORAL | 0 refills | Status: DC | PRN

## 2022-06-17 MED ORDER — CEFDINIR 300 MG PO CAPS: 600.0000 mg | ORAL_CAPSULE | Freq: Every day | ORAL | 0 refills | Status: AC

## 2022-06-17 MED ORDER — KETOROLAC TROMETHAMINE 30 MG/ML IJ SOLN
30.0000 mg | Freq: Once | INTRAMUSCULAR | Status: AC
  Administered 2022-06-17: 30 mg via INTRAMUSCULAR

## 2022-06-17 MED ORDER — KETOROLAC TROMETHAMINE 30 MG/ML IJ SOLN
INTRAMUSCULAR | Status: AC
  Filled 2022-06-17: qty 1

## 2022-06-17 MED ORDER — KETOROLAC TROMETHAMINE 10 MG PO TABS: 10.0000 mg | ORAL_TABLET | Freq: Four times a day (QID) | ORAL | 0 refills | Status: DC | PRN

## 2022-06-17 NOTE — ED Triage Notes (Signed)
Patient presenting with left ear ache, congestion, jaw pain, throbbing throat pain. Onset 3-4 days. States she was around someone sick, unknown diagnosis.

## 2022-06-17 NOTE — ED Provider Notes (Signed)
[facility]    CSN: [id number] Arrival date & time: 06/17/22  1529      History   Chief Complaint Chief Complaint  Patient presents with   Otalgia   Nasal Congestion    HPI Tasha Fernandez is a 52 y.o. female.    Otalgia  Here for left ear pain congestion, and sore throat.  She states symptoms began about May 6 or 7.  No fever or chills.  No vomiting or diarrhea.  She also request that I check to make sure she does not have a urinary infection.  She states she was admitted with sepsis and kidney infection, but she is not having any dysuria  Past Medical History:  Diagnosis Date   Anxiety    Arthritis    Herniated disc    Seasonal allergies    TMJ (dislocation of temporomandibular joint)     Patient Active Problem List   Diagnosis Date Noted   Acute pyelonephritis 05/14/2022   Severe sepsis (HCC) 05/14/2022   Acute hyponatremia 05/14/2022   Dehydration 05/14/2022   AKI (acute kidney injury) (HCC) 05/14/2022   Otitis, externa, infective 10/03/2012   Seasonal allergies 10/03/2012   Tachycardia 08/08/2012   Metrorrhagia 04/30/2012   Menorrhagia 04/30/2012   Atypical face pain 03/28/2012   Medical non-compliance 02/17/2012   Headache 11/19/2011   Chronic pain syndrome 10/09/2011   Cervicalgia 06/17/2011   Chronic jaw pain 04/22/2011   Medication overuse headache 01/17/2011   Somatic Dysfunction (Osteopathic Findings) 01/13/2011   Chronic headache 08/26/2010   ALLERGIC RHINITIS, SEASONAL 04/24/2008   GERD (gastroesophageal reflux disease) 04/24/2008   GAD (generalized anxiety disorder) 04/06/2006    Past Surgical History:  Procedure Laterality Date   CESAREAN SECTION     CHOLECYSTECTOMY     TUBAL LIGATION      OB History   No obstetric history on file.      Home Medications    Prior to Admission medications   Medication Sig Start Date End Date Taking? Authorizing Provider  aspirin-acetaminophen-caffeine (EXCEDRIN MIGRAINE)  [phone] MG per tablet Take 1 tablet by mouth every 6 (six) hours as needed for pain.   Yes [provider]  benzonatate (TESSALON) 100 MG capsule Take 1 capsule (100 mg total) by mouth 3 (three) times daily as needed for cough. 06/17/22  Yes Zenia Resides, MD  cefdinir (OMNICEF) 300 MG capsule Take 2 capsules (600 mg total) by mouth daily for 7 days. 06/17/22 06/24/22 Yes Ethon Wymer, Janace Aris, MD  ketorolac (TORADOL) 10 MG tablet Take 1 tablet (10 mg total) by mouth every 6 (six) hours as needed (pain). 06/17/22  Yes Zenia Resides, MD  Aspirin-Acetaminophen (GOODYS BODY PAIN PO) Take 1 packet by mouth daily as needed (pain).    [provider]  CarBAMazepine (TEGRETOL PO) Take 1 tablet by mouth 2 (two) times daily.    [provider]  pantoprazole (PROTONIX) 20 MG tablet Take 1 tablet (20 mg total) by mouth 2 (two) times daily. 03/14/21   Lorre Nick, MD  venlafaxine XR (EFFEXOR-XR) 150 MG 24 hr capsule Take 150 mg by mouth daily. 05/16/13 05/16/14  [provider]  montelukast (SINGULAIR) 10 MG tablet Take 1 tablet (10 mg total) by mouth daily. 07/02/10 04/21/11  Tobey Grim, MD    Family History Family History  Problem Relation Age of Onset   Bursitis Mother     Social History Social History   Tobacco Use   Smoking status: Never  Smokeless tobacco: Never  Vaping Use   Vaping Use: Never used  Substance Use Topics   Alcohol use: No    Comment: social   Drug use: No     Allergies   Bee venom, Dilaudid [hydromorphone], and Other   Review of Systems Review of Systems  HENT:  Positive for ear pain.      Physical Exam Triage Vital Signs ED Triage Vitals  Enc Vitals Group     BP 06/17/22 1619 (!) 163/92     Pulse Rate 06/17/22 1619 79     Resp 06/17/22 1619 18     Temp 06/17/22 1619 98 F (36.7 C)     Temp Source 06/17/22 1619 Oral     SpO2 06/17/22 1619 97 %     Weight --      Height --      Head Circumference --       Peak Flow --      Pain Score 06/17/22 1616 10     Pain Loc --      Pain Edu? --      Excl. in GC? --    No data found.  Updated Vital Signs BP (!) 163/92 (BP Location: Right Arm)   Pulse 79   Temp 98 F (36.7 C) (Oral)   Resp 18   LMP 06/11/2013   SpO2 97%   Visual Acuity Right Eye Distance:   Left Eye Distance:   Bilateral Distance:    Right Eye Near:   Left Eye Near:    Bilateral Near:     Physical Exam Vitals reviewed.  Constitutional:      General: She is not in acute distress.    Appearance: She is not ill-appearing, toxic-appearing or diaphoretic.  HENT:     Right Ear: Ear canal normal.     Left Ear: Ear canal normal.     Ears:     Comments: Right tympanic membrane is gray and shiny.  Left tympanic membrane is angry and red and dull and bulging.    Nose: Nose normal.     Mouth/Throat:     Mouth: Mucous membranes are moist.     Pharynx: No oropharyngeal exudate or posterior oropharyngeal erythema.  Eyes:     Extraocular Movements: Extraocular movements intact.     Conjunctiva/sclera: Conjunctivae normal.     Pupils: Pupils are equal, round, and reactive to light.  Cardiovascular:     Rate and Rhythm: Normal rate and regular rhythm.     Heart sounds: No murmur heard. Pulmonary:     Effort: Pulmonary effort is normal. No respiratory distress.     Breath sounds: No stridor. No wheezing, rhonchi or rales.  Musculoskeletal:     Cervical back: Neck supple.  Lymphadenopathy:     Cervical: No cervical adenopathy.  Skin:    Capillary Refill: Capillary refill takes less than 2 seconds.     Coloration: Skin is not jaundiced or pale.  Neurological:     General: No focal deficit present.     Mental Status: She is alert and oriented to person, place, and time.  Psychiatric:        Behavior: Behavior normal.      UC Treatments / Results  Labs (all labs ordered are listed, but only abnormal results are displayed) Labs Reviewed  POCT URINALYSIS DIP (MANUAL  ENTRY) - Abnormal; Notable for the following components:      Result Value   Color, UA light yellow (*)  Spec Grav, UA >=1.030 (*)    Blood, UA trace-intact (*)    Leukocytes, UA Small (1+) (*)    All other components within normal limits  URINE CULTURE    EKG   Radiology No results found.  Procedures Procedures (including critical care time)  Medications Ordered in UC Medications  ketorolac (TORADOL) 30 MG/ML injection 30 mg (30 mg Intramuscular Given 06/17/22 1745)    Initial Impression / Assessment and Plan / UC Course  I have reviewed the triage vital signs and the nursing notes.  Pertinent labs & imaging results that were available during my care of the patient were reviewed by me and considered in my medical decision making (see chart for details).        UA is done during address her concerns; there are some leukocytes, so urine culture is sent  She was obviously in some pain, so Toradol injection is given here and Toradol is sent to the pharmacy.  Truman Hayward is sent in for the otitis media and Tessalon Perles are sent in for the cough   Final Clinical Impressions(s) / UC Diagnoses   Final diagnoses:  Left otitis media, unspecified otitis media type  Viral upper respiratory tract infection  Urinary frequency     Discharge Instructions      Here urinalysis shows some white blood cells.  Urine culture is sent.  Staff will notify if there is anything growing on the culture  You have been given a shot of Toradol 30 mg today.  Ketorolac 10 mg tablets--take 1 tablet every 6 hours as needed for pain.  This is the same medicine that is in the shot we just gave you  Take cefdinir 300 mg--2 capsules together daily for 7 days\      ED Prescriptions     Medication Sig Dispense Auth. Provider   cefdinir (OMNICEF) 300 MG capsule Take 2 capsules (600 mg total) by mouth daily for 7 days. 14 capsule Naveya Ellerman, Janace Aris, MD   ketorolac (TORADOL) 10 MG tablet Take  1 tablet (10 mg total) by mouth every 6 (six) hours as needed (pain). 20 tablet Suheyla Mortellaro, Janace Aris, MD   benzonatate (TESSALON) 100 MG capsule Take 1 capsule (100 mg total) by mouth 3 (three) times daily as needed for cough. 21 capsule Zenia Resides, MD      PDMP not reviewed this encounter.   Zenia Resides, MD 06/17/22 2024

## 2022-06-17 NOTE — Discharge Instructions (Addendum)
Here urinalysis shows some white blood cells.  Urine culture is sent.  Staff will notify if there is anything growing on the culture  You have been given a shot of Toradol 30 mg today.  Ketorolac 10 mg tablets--take 1 tablet every 6 hours as needed for pain.  This is the same medicine that is in the shot we just gave you  Take cefdinir 300 mg--2 capsules together daily for 7 days\

## 2022-06-18 LAB — URINE CULTURE: Culture: NO GROWTH

## 2022-07-11 ENCOUNTER — Other Ambulatory Visit: Payer: Self-pay

## 2022-07-11 ENCOUNTER — Ambulatory Visit (HOSPITAL_COMMUNITY)
Admission: EM | Admit: 2022-07-11 | Discharge: 2022-07-11 | Disposition: A | Discharge status: Home / Self Care | Payer: Medicaid Other | Attending: Internal Medicine | Admitting: Internal Medicine

## 2022-07-11 ENCOUNTER — Encounter (HOSPITAL_COMMUNITY): Payer: Self-pay | Admitting: Emergency Medicine

## 2022-07-11 DIAGNOSIS — Z113 Encounter for screening for infections with a predominantly sexual mode of transmission: Secondary | ICD-10-CM

## 2022-07-11 LAB — HIV ANTIBODY (ROUTINE TESTING W REFLEX): HIV Screen 4th Generation wRfx: NONREACTIVE

## 2022-07-11 NOTE — ED Triage Notes (Addendum)
Patient requesting testing for std.  Denies vaginal discharge, denies urinary symptoms.  Patient is persistent in being checked for every thing.    On exiting treatment room patient mentioned syphilis concern.  States boyfriend has been trying to give plasma and told he cannot due to having syphilis antibodies detected in blood

## 2022-07-11 NOTE — Discharge Instructions (Addendum)
Please avoid sexual intercourse until lab results are available Will call you with recommendations if labs are abnormal Please return to urgent care if you have any other concerns.

## 2022-07-12 LAB — CERVICOVAGINAL ANCILLARY ONLY
Bacterial Vaginitis (gardnerella): NEGATIVE
Candida Glabrata: NEGATIVE
Candida Vaginitis: POSITIVE — AB
Chlamydia: NEGATIVE
Comment: NEGATIVE
Comment: NEGATIVE
Comment: NEGATIVE
Comment: NEGATIVE
Comment: NEGATIVE
Comment: NORMAL
Neisseria Gonorrhea: NEGATIVE
Trichomonas: NEGATIVE

## 2022-07-12 LAB — RPR: RPR Ser Ql: NONREACTIVE

## 2022-07-12 NOTE — ED Provider Notes (Signed)
[facility]    CSN: [id number] Arrival date & time: 07/11/22  0846      History   Chief Complaint Chief Complaint  Patient presents with   SEXUALLY TRANSMITTED DISEASE    HPI Tasha Fernandez is a 52 y.o. female comes to the urgent care requesting STD screening.  Patient denies any symptoms.  No abdominal pain.  No groin pain.  No nausea or vomiting.  No vaginal rash HPI  Past Medical History:  Diagnosis Date   Anxiety    Arthritis    Herniated disc    Seasonal allergies    TMJ (dislocation of temporomandibular joint)     Patient Active Problem List   Diagnosis Date Noted   Acute pyelonephritis 05/14/2022   Severe sepsis (HCC) 05/14/2022   Acute hyponatremia 05/14/2022   Dehydration 05/14/2022   AKI (acute kidney injury) (HCC) 05/14/2022   Otitis, externa, infective 10/03/2012   Seasonal allergies 10/03/2012   Tachycardia 08/08/2012   Metrorrhagia 04/30/2012   Menorrhagia 04/30/2012   Atypical face pain 03/28/2012   Medical non-compliance 02/17/2012   Headache 11/19/2011   Chronic pain syndrome 10/09/2011   Cervicalgia 06/17/2011   Chronic jaw pain 04/22/2011   Medication overuse headache 01/17/2011   Somatic Dysfunction (Osteopathic Findings) 01/13/2011   Chronic headache 08/26/2010   ALLERGIC RHINITIS, SEASONAL 04/24/2008   GERD (gastroesophageal reflux disease) 04/24/2008   GAD (generalized anxiety disorder) 04/06/2006    Past Surgical History:  Procedure Laterality Date   CESAREAN SECTION     CHOLECYSTECTOMY     TUBAL LIGATION      OB History   No obstetric history on file.      Home Medications    Prior to Admission medications   Medication Sig Start Date End Date Taking? Authorizing Provider  pantoprazole (PROTONIX) 20 MG tablet Take 1 tablet (20 mg total) by mouth 2 (two) times daily. Patient not taking: Reported on 07/11/2022 03/14/21   Lorre Nick, MD  venlafaxine XR (EFFEXOR-XR) 150 MG 24 hr capsule Take 150 mg by mouth  daily. Patient not taking: Reported on 07/11/2022 05/16/13 05/16/14  [provider]  montelukast (SINGULAIR) 10 MG tablet Take 1 tablet (10 mg total) by mouth daily. 07/02/10 04/21/11  Tobey Grim, MD    Family History Family History  Problem Relation Age of Onset   Bursitis Mother     Social History Social History   Tobacco Use   Smoking status: Never   Smokeless tobacco: Never  Vaping Use   Vaping Use: Never used  Substance Use Topics   Alcohol use: No    Comment: social   Drug use: No     Allergies   Bee venom, Dilaudid [hydromorphone], and Other   Review of Systems Review of Systems As per HPI  Physical Exam Triage Vital Signs ED Triage Vitals  Enc Vitals Group     BP 07/11/22 0924 (!) 153/100     Pulse Rate 07/11/22 0924 92     Resp 07/11/22 0924 20     Temp 07/11/22 0924 97.8 F (36.6 C)     Temp Source 07/11/22 0924 Oral     SpO2 07/11/22 0924 98 %     Weight --      Height --      Head Circumference --      Peak Flow --      Pain Score 07/11/22 0921 0     Pain Loc --      Pain Edu? --  Excl. in GC? --    No data found.  Updated Vital Signs BP 137/86 (BP Location: Right Arm)   Pulse 92   Temp 97.8 F (36.6 C) (Oral)   Resp 20   LMP 06/11/2013   SpO2 98%   Visual Acuity Right Eye Distance:   Left Eye Distance:   Bilateral Distance:    Right Eye Near:   Left Eye Near:    Bilateral Near:     Physical Exam Vitals and nursing note reviewed.  Constitutional:      Appearance: Normal appearance.  Neurological:     General: No focal deficit present.     Mental Status: She is alert and oriented to person, place, and time.      UC Treatments / Results  Labs (all labs ordered are listed, but only abnormal results are displayed) Labs Reviewed  CERVICOVAGINAL ANCILLARY ONLY - Abnormal; Notable for the following components:      Result Value   Candida Vaginitis Positive (*)    All other components within normal limits   HIV ANTIBODY (ROUTINE TESTING W REFLEX)  RPR    EKG   Radiology No results found.  Procedures Procedures (including critical care time)  Medications Ordered in UC Medications - No data to display  Initial Impression / Assessment and Plan / UC Course  I have reviewed the triage vital signs and the nursing notes.  Pertinent labs & imaging results that were available during my care of the patient were reviewed by me and considered in my medical decision making (see chart for details).     1.  Screening for STDs: Cervicovaginal swab for GC/chlamydia/trichomonas/bacterial vaginosis/vaginal yeast Will call patient with recommendations if labs are abnormal Return precautions given. Final Clinical Impressions(s) / UC Diagnoses   Final diagnoses:  Screen for STD (sexually transmitted disease)     Discharge Instructions      Please avoid sexual intercourse until lab results are available Will call you with recommendations if labs are abnormal Please return to urgent care if you have any other concerns.   ED Prescriptions   None    PDMP not reviewed this encounter.   Merrilee Jansky, MD 07/12/22 [phone]

## 2022-07-13 ENCOUNTER — Telehealth (HOSPITAL_COMMUNITY): Payer: Self-pay | Admitting: Emergency Medicine

## 2022-07-13 MED ORDER — FLUCONAZOLE 150 MG PO TABS: 150.0000 mg | ORAL_TABLET | Freq: Once | ORAL | 0 refills | Status: AC

## 2022-08-19 ENCOUNTER — Ambulatory Visit (HOSPITAL_COMMUNITY)
Admission: EM | Admit: 2022-08-19 | Discharge: 2022-08-19 | Disposition: A | Discharge status: Home / Self Care | Payer: MEDICAID | Attending: Physician Assistant | Admitting: Physician Assistant

## 2022-08-19 ENCOUNTER — Encounter (HOSPITAL_COMMUNITY): Payer: Self-pay

## 2022-08-19 DIAGNOSIS — R350 Frequency of micturition: Secondary | ICD-10-CM

## 2022-08-19 LAB — POCT URINALYSIS DIP (MANUAL ENTRY)
Bilirubin, UA: NEGATIVE
Glucose, UA: NEGATIVE mg/dL
Ketones, POC UA: NEGATIVE mg/dL
Leukocytes, UA: NEGATIVE
Nitrite, UA: NEGATIVE
Protein Ur, POC: NEGATIVE mg/dL
Spec Grav, UA: 1.025 (ref 1.010–1.025)
Urobilinogen, UA: 1 E.U./dL
pH, UA: 6 (ref 5.0–8.0)

## 2022-08-19 NOTE — Discharge Instructions (Signed)
Will send out urine culture and cervicovaginal swab and treat if indicated based on results.  Drink plenty of fluids Return if you develop worsening symptoms.

## 2022-08-19 NOTE — ED Triage Notes (Signed)
Pt states frequent urination and foul smelling urine for the past few days.

## 2022-08-19 NOTE — ED Provider Notes (Signed)
[facility]    CSN: [id number] Arrival date & time: 08/19/22  1643      History   Chief Complaint Chief Complaint  Patient presents with   Dysuria    HPI Tasha Fernandez is a 52 y.o. female.   Patient complains of increased urinary frequency and foul-smelling urine that started several days ago.  Denies dysuria, abdominal pain, flank pain, fever, chills.  She denies vaginal itching or increased vaginal discharge.  She has tried nothing for the symptoms.  Elevated BP in clinic today.  Patient reports she has been under a lot of stress lately.  She reports no previous diagnosis of HTN.  Asymptomatic at this time.  BP Readings from Last 3 Encounters: 08/19/22 : (!) 171/112 07/11/22 : 137/86 06/17/22 : (!) 163/92      Past Medical History:  Diagnosis Date   Anxiety    Arthritis    Herniated disc    Seasonal allergies    TMJ (dislocation of temporomandibular joint)     Patient Active Problem List   Diagnosis Date Noted   Acute pyelonephritis 05/14/2022   Severe sepsis (HCC) 05/14/2022   Acute hyponatremia 05/14/2022   Dehydration 05/14/2022   AKI (acute kidney injury) (HCC) 05/14/2022   Otitis, externa, infective 10/03/2012   Seasonal allergies 10/03/2012   Tachycardia 08/08/2012   Metrorrhagia 04/30/2012   Menorrhagia 04/30/2012   Atypical face pain 03/28/2012   Medical non-compliance 02/17/2012   Headache 11/19/2011   Chronic pain syndrome 10/09/2011   Cervicalgia 06/17/2011   Chronic jaw pain 04/22/2011   Medication overuse headache 01/17/2011   Somatic Dysfunction (Osteopathic Findings) 01/13/2011   Chronic headache 08/26/2010   ALLERGIC RHINITIS, SEASONAL 04/24/2008   GERD (gastroesophageal reflux disease) 04/24/2008   GAD (generalized anxiety disorder) 04/06/2006    Past Surgical History:  Procedure Laterality Date   CESAREAN SECTION     CHOLECYSTECTOMY     TUBAL LIGATION      OB History   No obstetric history on file.       Home Medications    Prior to Admission medications   Medication Sig Start Date End Date Taking? Authorizing Provider  pantoprazole (PROTONIX) 20 MG tablet Take 1 tablet (20 mg total) by mouth 2 (two) times daily. Patient not taking: Reported on 07/11/2022 03/14/21   Lorre Nick, MD  venlafaxine XR (EFFEXOR-XR) 150 MG 24 hr capsule Take 150 mg by mouth daily. Patient not taking: Reported on 07/11/2022 05/16/13 05/16/14  [provider]  montelukast (SINGULAIR) 10 MG tablet Take 1 tablet (10 mg total) by mouth daily. 07/02/10 04/21/11  Tobey Grim, MD    Family History Family History  Problem Relation Age of Onset   Bursitis Mother     Social History Social History   Tobacco Use   Smoking status: Never   Smokeless tobacco: Never  Vaping Use   Vaping status: Never Used  Substance Use Topics   Alcohol use: No    Comment: social   Drug use: No     Allergies   Bee venom, Dilaudid [hydromorphone], and Other   Review of Systems Review of Systems  Constitutional:  Negative for chills and fever.  HENT:  Negative for ear pain and sore throat.   Eyes:  Negative for pain and visual disturbance.  Respiratory:  Negative for cough and shortness of breath.   Cardiovascular:  Negative for chest pain and palpitations.  Gastrointestinal:  Negative for abdominal pain and vomiting.  Genitourinary:  Positive for  frequency. Negative for dysuria and hematuria.  Musculoskeletal:  Negative for arthralgias and back pain.  Skin:  Negative for color change and rash.  Neurological:  Negative for seizures and syncope.  All other systems reviewed and are negative.    Physical Exam Triage Vital Signs ED Triage Vitals  Encounter Vitals Group     BP 08/19/22 1700 (!) 171/112     Systolic BP Percentile --      Diastolic BP Percentile --      Pulse Rate 08/19/22 1700 80     Resp 08/19/22 1700 16     Temp 08/19/22 1700 98.3 F (36.8 C)     Temp Source 08/19/22 1700 Oral      SpO2 08/19/22 1700 95 %     Weight --      Height --      Head Circumference --      Peak Flow --      Pain Score 08/19/22 1702 0     Pain Loc --      Pain Education --      Exclude from Growth Chart --    No data found.  Updated Vital Signs BP (!) 171/112 (BP Location: Right Arm)   Pulse 80   Temp 98.3 F (36.8 C) (Oral)   Resp 16   LMP 06/11/2013   SpO2 95%   Visual Acuity Right Eye Distance:   Left Eye Distance:   Bilateral Distance:    Right Eye Near:   Left Eye Near:    Bilateral Near:     Physical Exam Vitals and nursing note reviewed.  Constitutional:      General: She is not in acute distress.    Appearance: She is well-developed.  HENT:     Head: Normocephalic and atraumatic.  Eyes:     Conjunctiva/sclera: Conjunctivae normal.  Cardiovascular:     Rate and Rhythm: Normal rate and regular rhythm.     Heart sounds: No murmur heard. Pulmonary:     Effort: Pulmonary effort is normal. No respiratory distress.     Breath sounds: Normal breath sounds.  Abdominal:     Palpations: Abdomen is soft.     Tenderness: There is no abdominal tenderness.  Musculoskeletal:        General: No swelling.     Cervical back: Neck supple.  Skin:    General: Skin is warm and dry.     Capillary Refill: Capillary refill takes less than 2 seconds.  Neurological:     Mental Status: She is alert.  Psychiatric:        Mood and Affect: Mood normal.      UC Treatments / Results  Labs (all labs ordered are listed, but only abnormal results are displayed) Labs Reviewed  POCT URINALYSIS DIP (MANUAL ENTRY) - Abnormal; Notable for the following components:      Result Value   Blood, UA trace-intact (*)    All other components within normal limits  URINE CULTURE  CERVICOVAGINAL ANCILLARY ONLY    EKG   Radiology No results found.  Procedures Procedures (including critical care time)  Medications Ordered in UC Medications - No data to display  Initial Impression  / Assessment and Plan / UC Course  I have reviewed the triage vital signs and the nursing notes.  Pertinent labs & imaging results that were available during my care of the patient were reviewed by me and considered in my medical decision making (see chart for details).  Clinic today.  Will treat based on results.  Supportive care discussed.  Return Final Clinical Impressions(s) / UC Diagnoses   Final diagnoses:  Urinary frequency     Discharge Instructions      Will send out urine culture and cervicovaginal swab and treat if indicated based on results.  Drink plenty of fluids Return if you develop worsening symptoms.      ED Prescriptions   None    PDMP not reviewed this encounter.   Ward, Tylene Fantasia, PA-C 08/19/22 1746

## 2022-08-21 LAB — URINE CULTURE

## 2022-08-22 LAB — CERVICOVAGINAL ANCILLARY ONLY
Bacterial Vaginitis (gardnerella): NEGATIVE
Candida Glabrata: NEGATIVE
Candida Vaginitis: NEGATIVE
Chlamydia: NEGATIVE
Comment: NEGATIVE
Comment: NEGATIVE
Comment: NEGATIVE
Comment: NEGATIVE
Comment: NEGATIVE
Comment: NORMAL
Neisseria Gonorrhea: NEGATIVE
Trichomonas: NEGATIVE

## 2022-09-15 ENCOUNTER — Other Ambulatory Visit: Payer: Self-pay

## 2022-09-15 ENCOUNTER — Encounter (HOSPITAL_COMMUNITY): Payer: Self-pay | Admitting: Emergency Medicine

## 2022-09-15 ENCOUNTER — Ambulatory Visit (HOSPITAL_COMMUNITY)
Admission: EM | Admit: 2022-09-15 | Discharge: 2022-09-15 | Disposition: A | Discharge status: Home / Self Care | Payer: MEDICAID | Attending: Nurse Practitioner | Admitting: Nurse Practitioner

## 2022-09-15 DIAGNOSIS — R350 Frequency of micturition: Secondary | ICD-10-CM | POA: Insufficient documentation

## 2022-09-15 LAB — POCT URINALYSIS DIP (MANUAL ENTRY)
Bilirubin, UA: NEGATIVE
Blood, UA: NEGATIVE
Glucose, UA: NEGATIVE mg/dL
Ketones, POC UA: NEGATIVE mg/dL
Leukocytes, UA: NEGATIVE
Nitrite, UA: NEGATIVE
Protein Ur, POC: NEGATIVE mg/dL
Spec Grav, UA: 1.015 (ref 1.010–1.025)
Urobilinogen, UA: 0.2 E.U./dL
pH, UA: 6.5 (ref 5.0–8.0)

## 2022-09-15 LAB — HIV ANTIBODY (ROUTINE TESTING W REFLEX): HIV Screen 4th Generation wRfx: NONREACTIVE

## 2022-09-15 NOTE — Discharge Instructions (Signed)
The urine test today is clear, no UTI.  We will let you know if any of the results are positive from the vaginal or blood testing today.    Please follow up with OB/GYN regarding your symptoms since they have been recurrent and all of our testing has been good so far.

## 2022-09-15 NOTE — ED Provider Notes (Signed)
[facility]    CSN: [id number] Arrival date & time: 09/15/22  1501      History   Chief Complaint Chief Complaint  Patient presents with   Urinary Tract Infection    HPI Tasha Fernandez is a 52 y.o. female.   Patient presents today with 2 week history of urinary frequency, urgency, foul urinary odor, and concern for UTI. Also having suprapubic pressure and low back pain.  No dysuria, fever, nausea/vomiting, or change in appetite.  No vaginal discharge that she is aware of.  Reports she has noticed blood on the toilet paper when she wipes occasionally and this has been ongoing for months.  No concern for STI today, however is sexually active with 1 female partner.  Is willing to undergo STI testing.  Does not currently have an OB/GYN.  Patient asks same questions repeatedly and states the same complaint repeatedly.  Of note, patient has been seen for similar complaints in urgent care 2 times in the past 2 months and tested positive for yeast infection once.     Past Medical History:  Diagnosis Date   Anxiety    Arthritis    Herniated disc    Seasonal allergies    TMJ (dislocation of temporomandibular joint)     Patient Active Problem List   Diagnosis Date Noted   Acute pyelonephritis 05/14/2022   Severe sepsis (HCC) 05/14/2022   Acute hyponatremia 05/14/2022   Dehydration 05/14/2022   AKI (acute kidney injury) (HCC) 05/14/2022   Otitis, externa, infective 10/03/2012   Seasonal allergies 10/03/2012   Tachycardia 08/08/2012   Metrorrhagia 04/30/2012   Menorrhagia 04/30/2012   Atypical face pain 03/28/2012   Medical non-compliance 02/17/2012   Headache 11/19/2011   Chronic pain syndrome 10/09/2011   Cervicalgia 06/17/2011   Chronic jaw pain 04/22/2011   Medication overuse headache 01/17/2011   Somatic Dysfunction (Osteopathic Findings) 01/13/2011   Chronic headache 08/26/2010   ALLERGIC RHINITIS, SEASONAL 04/24/2008   GERD (gastroesophageal reflux  disease) 04/24/2008   GAD (generalized anxiety disorder) 04/06/2006    Past Surgical History:  Procedure Laterality Date   CESAREAN SECTION     CHOLECYSTECTOMY     TUBAL LIGATION      OB History   No obstetric history on file.      Home Medications    Prior to Admission medications   Medication Sig Start Date End Date Taking? Authorizing Provider  pantoprazole (PROTONIX) 20 MG tablet Take 1 tablet (20 mg total) by mouth 2 (two) times daily. Patient not taking: Reported on 07/11/2022 03/14/21   Lorre Nick, MD  venlafaxine XR (EFFEXOR-XR) 150 MG 24 hr capsule Take 150 mg by mouth daily. Patient not taking: Reported on 07/11/2022 05/16/13 05/16/14  [provider]  montelukast (SINGULAIR) 10 MG tablet Take 1 tablet (10 mg total) by mouth daily. 07/02/10 04/21/11  Tobey Grim, MD    Family History Family History  Problem Relation Age of Onset   Bursitis Mother     Social History Social History   Tobacco Use   Smoking status: Never   Smokeless tobacco: Never  Vaping Use   Vaping status: Never Used  Substance Use Topics   Alcohol use: No    Comment: social   Drug use: No     Allergies   Bee venom, Dilaudid [hydromorphone], and Other   Review of Systems Review of Systems Per HPI  Physical Exam Triage Vital Signs ED Triage Vitals  Encounter Vitals Group  BP 09/15/22 1559 116/83     Systolic BP Percentile --      Diastolic BP Percentile --      Pulse Rate 09/15/22 1559 81     Resp 09/15/22 1559 18     Temp 09/15/22 1559 (!) 97.5 F (36.4 C)     Temp Source 09/15/22 1559 Oral     SpO2 09/15/22 1559 100 %     Weight --      Height --      Head Circumference --      Peak Flow --      Pain Score 09/15/22 1557 7     Pain Loc --      Pain Education --      Exclude from Growth Chart --    No data found.  Updated Vital Signs BP 116/83 (BP Location: Left Arm)   Pulse 81   Temp (!) 97.5 F (36.4 C) (Oral)   Resp 18   LMP 06/11/2013   SpO2  100%   Visual Acuity Right Eye Distance:   Left Eye Distance:   Bilateral Distance:    Right Eye Near:   Left Eye Near:    Bilateral Near:     Physical Exam Vitals and nursing note reviewed.  Constitutional:      General: She is not in acute distress.    Appearance: She is not toxic-appearing.  Cardiovascular:     Rate and Rhythm: Normal rate and regular rhythm.  Pulmonary:     Effort: Pulmonary effort is normal. No respiratory distress.  Abdominal:     General: Abdomen is flat. Bowel sounds are normal. There is no distension.     Palpations: Abdomen is soft. There is no mass.     Tenderness: There is no abdominal tenderness. There is no right CVA tenderness, left CVA tenderness or guarding.  Genitourinary:    Comments: Deferred - self swab performed by patient Skin:    General: Skin is warm and dry.     Coloration: Skin is not jaundiced or pale.     Findings: No erythema.  Neurological:     Mental Status: She is alert and oriented to person, place, and time.     Motor: No weakness.     Gait: Gait normal.  Psychiatric:        Mood and Affect: Mood is anxious.        Speech: Speech is rapid and pressured.        Behavior: Behavior is cooperative.      UC Treatments / Results  Labs (all labs ordered are listed, but only abnormal results are displayed) Labs Reviewed  HIV ANTIBODY (ROUTINE TESTING W REFLEX)  RPR  POCT URINALYSIS DIP (MANUAL ENTRY)  CERVICOVAGINAL ANCILLARY ONLY    EKG   Radiology No results found.  Procedures Procedures (including critical care time)  Medications Ordered in UC Medications - No data to display  Initial Impression / Assessment and Plan / UC Course  I have reviewed the triage vital signs and the nursing notes.  Pertinent labs & imaging results that were available during my care of the patient were reviewed by me and considered in my medical decision making (see chart for details).   Patient is well-appearing,  normotensive, afebrile, not tachycardic, not tachypneic, oxygenating well on room air.    1. Urinary frequency Discussed with patients UA is completely normal today Vaginal cytology pending-treat as indicated HIV and syphilis testing also obtained per patient request Suspect  possible overactive bladder at baseline versus possible psychiatric component Recommended follow-up with OB/GYN and contact information given  The patient was given the opportunity to ask questions.  All questions answered to their satisfaction.  The patient is in agreement to this plan.    Final Clinical Impressions(s) / UC Diagnoses   Final diagnoses:  Urinary frequency     Discharge Instructions      The urine test today is clear, no UTI.  We will let you know if any of the results are positive from the vaginal or blood testing today.    Please follow up with OB/GYN regarding your symptoms since they have been recurrent and all of our testing has been good so far.     ED Prescriptions   None    PDMP not reviewed this encounter.   Valentino Nose, NP 09/15/22 [phone]

## 2022-09-15 NOTE — ED Triage Notes (Signed)
Patient thinks she has a uti. Has low back pain, feeling pressure in pelvis.  Reports urinating more often.  Patient has not taken any medications.  Patient reports a vaginal discharge

## 2022-10-21 ENCOUNTER — Encounter (HOSPITAL_COMMUNITY): Payer: Self-pay | Admitting: *Deleted

## 2022-10-21 ENCOUNTER — Emergency Department (HOSPITAL_COMMUNITY): Payer: MEDICAID

## 2022-10-21 ENCOUNTER — Other Ambulatory Visit: Payer: Self-pay

## 2022-10-21 ENCOUNTER — Emergency Department (HOSPITAL_COMMUNITY)
Admission: EM | Admit: 2022-10-21 | Discharge: 2022-10-21 | Discharge status: Against Medical Advice | Payer: MEDICAID | Attending: Emergency Medicine | Admitting: Emergency Medicine

## 2022-10-21 DIAGNOSIS — M7918 Myalgia, other site: Secondary | ICD-10-CM | POA: Diagnosis not present

## 2022-10-21 DIAGNOSIS — Z5321 Procedure and treatment not carried out due to patient leaving prior to being seen by health care provider: Secondary | ICD-10-CM | POA: Insufficient documentation

## 2022-10-21 DIAGNOSIS — J029 Acute pharyngitis, unspecified: Secondary | ICD-10-CM | POA: Diagnosis not present

## 2022-10-21 DIAGNOSIS — R519 Headache, unspecified: Secondary | ICD-10-CM | POA: Diagnosis present

## 2022-10-21 LAB — GROUP A STREP BY PCR: Group A Strep by PCR: NOT DETECTED

## 2022-10-21 NOTE — ED Notes (Signed)
Pt told me in sort that she thinks she might have possible uti

## 2022-10-21 NOTE — ED Triage Notes (Signed)
Cold symptoms maybe low grade temp sinus  problems sore throat    lmp  none

## 2022-10-21 NOTE — ED Notes (Signed)
Pt left AMA due to long ED wait times.

## 2023-02-10 ENCOUNTER — Other Ambulatory Visit: Payer: Self-pay | Admitting: Internal Medicine

## 2023-02-10 ENCOUNTER — Other Ambulatory Visit: Payer: Self-pay

## 2023-02-10 DIAGNOSIS — Z1231 Encounter for screening mammogram for malignant neoplasm of breast: Secondary | ICD-10-CM

## 2023-02-28 ENCOUNTER — Ambulatory Visit (HOSPITAL_COMMUNITY)
Admission: EM | Admit: 2023-02-28 | Discharge: 2023-02-28 | Disposition: A | Discharge status: Home / Self Care | Payer: MEDICAID | Attending: Emergency Medicine | Admitting: Emergency Medicine

## 2023-02-28 ENCOUNTER — Encounter (HOSPITAL_COMMUNITY): Payer: Self-pay

## 2023-02-28 DIAGNOSIS — R3915 Urgency of urination: Secondary | ICD-10-CM | POA: Diagnosis not present

## 2023-02-28 DIAGNOSIS — R030 Elevated blood-pressure reading, without diagnosis of hypertension: Secondary | ICD-10-CM | POA: Diagnosis not present

## 2023-02-28 LAB — POCT URINALYSIS DIP (MANUAL ENTRY)
Bilirubin, UA: NEGATIVE
Blood, UA: NEGATIVE
Glucose, UA: NEGATIVE mg/dL
Ketones, POC UA: NEGATIVE mg/dL
Leukocytes, UA: NEGATIVE
Nitrite, UA: NEGATIVE
Protein Ur, POC: NEGATIVE mg/dL
Spec Grav, UA: 1.025 (ref 1.010–1.025)
Urobilinogen, UA: 0.2 U/dL
pH, UA: 7 (ref 5.0–8.0)

## 2023-02-28 NOTE — ED Provider Notes (Signed)
[facility]    CSN: [id number] Arrival date & time: 02/28/23  1447      History   Chief Complaint Chief Complaint  Patient presents with   Urinary Frequency   Hypertension    HPI Tasha Fernandez is a 53 y.o. female. Pt concerned about many things but mostly wants to be checked for a uti. Has had urinary urgency for several months and thinks she must have an infection. Also wants her BP checked and her blood sugar checked because she hasn't had a PCP in years and is just worried about her health in general. Her BP has been elevated in the past sometimes, sometimes is normal. Has been consuming a lot of caffeine. Does not smoke. Under a lot of stress at work.    Urinary Frequency  Hypertension    Past Medical History:  Diagnosis Date   Anxiety    Arthritis    Herniated disc    Seasonal allergies    TMJ (dislocation of temporomandibular joint)     Patient Active Problem List   Diagnosis Date Noted   Acute pyelonephritis 05/14/2022   Severe sepsis (HCC) 05/14/2022   Acute hyponatremia 05/14/2022   Dehydration 05/14/2022   AKI (acute kidney injury) (HCC) 05/14/2022   Otitis, externa, infective 10/03/2012   Seasonal allergies 10/03/2012   Tachycardia 08/08/2012   Metrorrhagia 04/30/2012   Menorrhagia 04/30/2012   Atypical face pain 03/28/2012   Medical non-compliance 02/17/2012   Headache 11/19/2011   Chronic pain syndrome 10/09/2011   Cervicalgia 06/17/2011   Chronic jaw pain 04/22/2011   Medication overuse headache 01/17/2011   Somatic Dysfunction (Osteopathic Findings) 01/13/2011   Chronic headache 08/26/2010   ALLERGIC RHINITIS, SEASONAL 04/24/2008   GERD (gastroesophageal reflux disease) 04/24/2008   GAD (generalized anxiety disorder) 04/06/2006    Past Surgical History:  Procedure Laterality Date   CESAREAN SECTION     CHOLECYSTECTOMY     TUBAL LIGATION      OB History   No obstetric history on file.      Home Medications     Prior to Admission medications   Medication Sig Start Date End Date Taking? Authorizing Provider  pantoprazole (PROTONIX) 20 MG tablet Take 1 tablet (20 mg total) by mouth 2 (two) times daily. Patient not taking: Reported on 07/11/2022 03/14/21   Lorre Nick, MD  montelukast (SINGULAIR) 10 MG tablet Take 1 tablet (10 mg total) by mouth daily. 07/02/10 04/21/11  Tobey Grim, MD    Family History Family History  Problem Relation Age of Onset   Bursitis Mother     Social History Social History   Tobacco Use   Smoking status: Never   Smokeless tobacco: Never  Vaping Use   Vaping status: Never Used  Substance Use Topics   Alcohol use: No    Comment: social   Drug use: No     Allergies   Bee venom, Dilaudid [hydromorphone], and Other   Review of Systems Review of Systems  Genitourinary:  Positive for frequency.     Physical Exam Triage Vital Signs ED Triage Vitals  Encounter Vitals Group     BP 02/28/23 1515 (!) 157/107     Systolic BP Percentile --      Diastolic BP Percentile --      Pulse Rate 02/28/23 1515 75     Resp 02/28/23 1515 16     Temp 02/28/23 1515 98 F (36.7 C)     Temp Source 02/28/23 1515 Oral  SpO2 02/28/23 1515 98 %     Weight --      Height 02/28/23 1516 5' 5.5" (1.664 m)     Head Circumference --      Peak Flow --      Pain Score 02/28/23 1520 6     Pain Loc --      Pain Education --      Exclude from Growth Chart --    No data found.  Updated Vital Signs BP (!) 157/107 (BP Location: Right Arm)   Pulse 75   Temp 98 F (36.7 C) (Oral)   Resp 16   Ht 5' 5.5" (1.664 m)   LMP 06/11/2013   SpO2 98%   BMI 26.23 kg/m   Visual Acuity Right Eye Distance:   Left Eye Distance:   Bilateral Distance:    Right Eye Near:   Left Eye Near:    Bilateral Near:     Physical Exam Constitutional:      General: She is not in acute distress.    Appearance: Normal appearance. She is not ill-appearing.  Cardiovascular:     Rate  and Rhythm: Normal rate and regular rhythm.  Pulmonary:     Effort: Pulmonary effort is normal.     Breath sounds: Normal breath sounds.  Abdominal:     General: Abdomen is flat. Bowel sounds are normal. There is no distension.     Tenderness: There is no abdominal tenderness. There is no right CVA tenderness, left CVA tenderness or guarding.  Neurological:     Mental Status: She is alert.  Psychiatric:        Speech: Speech is rapid and pressured.        Behavior: Behavior is cooperative.     Comments: Speech i      UC Treatments / Results  Labs (all labs ordered are listed, but only abnormal results are displayed) Labs Reviewed  POCT URINALYSIS DIP (MANUAL ENTRY)    EKG   Radiology No results found.  Procedures Procedures (including critical care time)  Medications Ordered in UC Medications - No data to display  Initial Impression / Assessment and Plan / UC Course  I have reviewed the triage vital signs and the nursing notes.  Pertinent labs & imaging results that were available during my care of the patient were reviewed by me and considered in my medical decision making (see chart for details).    Reassured pt no sign of UTI. BP is elvated today. Pt will need f/u with PCP. We looked at her medicaid card together and her assigned PCP is [facility]. I have shared their contact info with her. Discussed Kegel exercises. Pt to f/u with PCP about urinary urgency, elevated BP, and need for general healthcare check.   Final Clinical Impressions(s) / UC Diagnoses   Final diagnoses:  Urinary urgency  Elevated blood pressure reading     Discharge Instructions      You do not have a urinary tract infection today. I recommend Kegel exercises to strengthen your pelvic muscles.   Contact [facility] (number is listed below) for an appointment. They have several clinic locations - talk with them about that best location for you.  Talk with your  primary care provider about your concerns about your urinary habits, your elevated blood pressure, and your need for a general wellness check up.    ED Prescriptions   None    PDMP not reviewed this encounter.   Odell Choung,  Marzella Schlein, NP 02/28/23 [phone]

## 2023-02-28 NOTE — ED Triage Notes (Signed)
Patient here today with c/o urinary frequency for months. Patient is concerned with her blood sugar but also wants to make sure that she doesn't have a UTI. Patient also concerned with her blood pressure.

## 2023-02-28 NOTE — Discharge Instructions (Signed)
You do not have a urinary tract infection today. I recommend Kegel exercises to strengthen your pelvic muscles.   Contact [facility] (number is listed below) for an appointment. They have several clinic locations - talk with them about that best location for you.  Talk with your primary care provider about your concerns about your urinary habits, your elevated blood pressure, and your need for a general wellness check up.

## 2023-03-10 ENCOUNTER — Inpatient Hospital Stay: Admission: RE | Admit: 2023-03-10 | Payer: MEDICAID | Source: Ambulatory Visit

## 2023-05-30 ENCOUNTER — Encounter (HOSPITAL_COMMUNITY): Payer: Self-pay

## 2023-05-30 ENCOUNTER — Ambulatory Visit (HOSPITAL_COMMUNITY)
Admission: EM | Admit: 2023-05-30 | Discharge: 2023-05-30 | Disposition: A | Discharge status: Home / Self Care | Payer: MEDICAID | Attending: Internal Medicine | Admitting: Internal Medicine

## 2023-05-30 DIAGNOSIS — R350 Frequency of micturition: Secondary | ICD-10-CM

## 2023-05-30 DIAGNOSIS — J069 Acute upper respiratory infection, unspecified: Secondary | ICD-10-CM

## 2023-05-30 LAB — POCT URINALYSIS DIP (MANUAL ENTRY)
Bilirubin, UA: NEGATIVE
Glucose, UA: NEGATIVE mg/dL
Ketones, POC UA: NEGATIVE mg/dL
Leukocytes, UA: NEGATIVE
Nitrite, UA: NEGATIVE
Protein Ur, POC: NEGATIVE mg/dL
Spec Grav, UA: >=1.03 — AB (ref 1.010–1.025)
Urobilinogen, UA: 0.2 U/dL
pH, UA: 6 (ref 5.0–8.0)

## 2023-05-30 LAB — POC SARS CORONAVIRUS 2 AG -  ED: SARS Coronavirus 2 Ag: NEGATIVE

## 2023-05-30 MED ORDER — BENZONATATE 100 MG PO CAPS: 100.0000 mg | ORAL_CAPSULE | Freq: Three times a day (TID) | ORAL | 0 refills | Status: DC

## 2023-05-30 MED ORDER — GUAIFENESIN ER 1200 MG PO TB12: 1200.0000 mg | ORAL_TABLET | Freq: Two times a day (BID) | ORAL | 0 refills | Status: DC

## 2023-05-30 NOTE — Discharge Instructions (Addendum)
 You have a viral illness which will improve on its own with rest, fluids, and medications to help with your symptoms.  Tylenol , guaifenesin  (plain mucinex ), and saline nasal sprays may help relieve symptoms.  Tessalon  Perles as needed for cough.  Two teaspoons of honey in 1 cup of warm water every 4-6 hours may help with throat pains.  Humidifier in room at nighttime may help soothe cough (clean well daily).   Decrease your intake of urinary irritants and this will help with your urinary frequency. Your urinalysis does not show signs of urinary tract infection.  For chest pain, shortness of breath, inability to keep food or fluids down without vomiting, fever that does not respond to tylenol  or motrin , or any other severe symptoms, please go to the ER for further evaluation. Return to urgent care as needed, otherwise follow-up with PCP.

## 2023-05-30 NOTE — ED Triage Notes (Signed)
 Patient here today with c/o nasal congestion and cough X 2 days.   Patient is also c/o frequent urination and abd pain for a while. Patient states that she has frequent UTIs.

## 2023-05-30 NOTE — ED Provider Notes (Signed)
 [facility]    CSN: [id number] Arrival date & time: 05/30/23  1422      History   Chief Complaint Chief Complaint  Patient presents with   Nasal Congestion   Urinary Frequency    HPI Tasha Fernandez is a 53 y.o. female.   Patient presents to urgent care for evaluation of cough, nasal congestion, sore throat, and bodyaches that started 2 days ago.  Cough is mostly dry and nonproductive.  Denies shortness of breath associated with coughing.  No nausea, vomiting, diarrhea, abdominal pain, fever, chills, or rash.  Never smoker, denies history of chronic respiratory problems.  No recent sick contacts with similar symptoms history of seasonal allergies, she is not currently taking anything for allergies.  Complains of urinary frequency that started a few days ago as well.  Denies dysuria, gross hematuria, flank pain, fever, chills, headaches, dizziness, and recent antibiotic or steroid use.  No history of immunosuppression/use of SGLT2 inhibitor.  She admits to frequent intake of soda, sweet tea, and energy drinks.  Reports minimal water intake.  She notices that when she drinks more soda she pees more frequently.  She has not attempted use of any over-the-counter medications to help with urinary symptoms prior to arrival.   Urinary Frequency    Past Medical History:  Diagnosis Date   Anxiety    Arthritis    Herniated disc    Seasonal allergies    TMJ (dislocation of temporomandibular joint)     Patient Active Problem List   Diagnosis Date Noted   Acute pyelonephritis 05/14/2022   Severe sepsis (HCC) 05/14/2022   Acute hyponatremia 05/14/2022   Dehydration 05/14/2022   AKI (acute kidney injury) (HCC) 05/14/2022   Otitis, externa, infective 10/03/2012   Seasonal allergies 10/03/2012   Tachycardia 08/08/2012   Metrorrhagia 04/30/2012   Menorrhagia 04/30/2012   Atypical face pain 03/28/2012   Medical non-compliance 02/17/2012   Headache 11/19/2011   Chronic  pain syndrome 10/09/2011   Cervicalgia 06/17/2011   Chronic jaw pain 04/22/2011   Medication overuse headache 01/17/2011   Somatic Dysfunction (Osteopathic Findings) 01/13/2011   Chronic headache 08/26/2010   ALLERGIC RHINITIS, SEASONAL 04/24/2008   GERD (gastroesophageal reflux disease) 04/24/2008   GAD (generalized anxiety disorder) 04/06/2006    Past Surgical History:  Procedure Laterality Date   CESAREAN SECTION     CHOLECYSTECTOMY     TUBAL LIGATION      OB History   No obstetric history on file.      Home Medications    Prior to Admission medications   Medication Sig Start Date End Date Taking? Authorizing Provider  benzonatate  (TESSALON ) 100 MG capsule Take 1 capsule (100 mg total) by mouth every 8 (eight) hours. 05/30/23  Yes Starlene Eaton, FNP  Guaifenesin  1200 MG TB12 Take 1 tablet (1,200 mg total) by mouth in the morning and at bedtime. 05/30/23  Yes Starlene Eaton, FNP  pantoprazole  (PROTONIX ) 20 MG tablet Take 1 tablet (20 mg total) by mouth 2 (two) times daily. Patient not taking: Reported on 07/11/2022 03/14/21   Lind Repine, MD  montelukast  (SINGULAIR ) 10 MG tablet Take 1 tablet (10 mg total) by mouth daily. 07/02/10 04/21/11  Trenton Frock, MD    Family History Family History  Problem Relation Age of Onset   Bursitis Mother     Social History Social History   Tobacco Use   Smoking status: Never   Smokeless tobacco: Never  Vaping Use   Vaping status:  Never Used  Substance Use Topics   Alcohol use: No    Comment: social   Drug use: No     Allergies   Bee venom, Dilaudid  [hydromorphone ], and Other   Review of Systems Review of Systems  Genitourinary:  Positive for frequency.  Per HPI   Physical Exam Triage Vital Signs ED Triage Vitals  Encounter Vitals Group     BP 05/30/23 1527 139/87     Systolic BP Percentile --      Diastolic BP Percentile --      Pulse Rate 05/30/23 1527 84     Resp 05/30/23 1527 16     Temp  05/30/23 1527 98 F (36.7 C)     Temp Source 05/30/23 1527 Oral     SpO2 05/30/23 1527 98 %     Weight --      Height --      Head Circumference --      Peak Flow --      Pain Score 05/30/23 1525 5     Pain Loc --      Pain Education --      Exclude from Growth Chart --    No data found.  Updated Vital Signs BP 139/87 (BP Location: Left Arm)   Pulse 84   Temp 98 F (36.7 C) (Oral)   Resp 16   LMP 06/11/2013   SpO2 98%   Visual Acuity Right Eye Distance:   Left Eye Distance:   Bilateral Distance:    Right Eye Near:   Left Eye Near:    Bilateral Near:     Physical Exam Vitals and nursing note reviewed.  Constitutional:      Appearance: She is not ill-appearing or toxic-appearing.  HENT:     Head: Normocephalic and atraumatic.     Right Ear: Hearing, tympanic membrane, ear canal and external ear normal.     Left Ear: Hearing, tympanic membrane, ear canal and external ear normal.     Nose: Congestion present.     Mouth/Throat:     Lips: Pink.     Mouth: Mucous membranes are moist. No injury or oral lesions.     Dentition: Normal dentition.     Tongue: No lesions.     Pharynx: Oropharynx is clear. Uvula midline. No pharyngeal swelling, oropharyngeal exudate, posterior oropharyngeal erythema, uvula swelling or postnasal drip.     Tonsils: No tonsillar exudate.  Eyes:     General: Lids are normal. Vision grossly intact. Gaze aligned appropriately.     Extraocular Movements: Extraocular movements intact.     Conjunctiva/sclera: Conjunctivae normal.  Neck:     Trachea: Trachea and phonation normal.  Cardiovascular:     Rate and Rhythm: Normal rate and regular rhythm.     Heart sounds: Normal heart sounds, S1 normal and S2 normal.  Pulmonary:     Effort: Pulmonary effort is normal. No respiratory distress.     Breath sounds: Normal breath sounds and air entry.  Abdominal:     Tenderness: There is no right CVA tenderness or left CVA tenderness.  Musculoskeletal:      Cervical back: Neck supple.  Lymphadenopathy:     Cervical: No cervical adenopathy.  Skin:    General: Skin is warm and dry.     Capillary Refill: Capillary refill takes less than 2 seconds.     Findings: No rash.  Neurological:     General: No focal deficit present.     Mental Status: She  is alert and oriented to person, place, and time. Mental status is at baseline.     Cranial Nerves: No dysarthria or facial asymmetry.  Psychiatric:        Mood and Affect: Mood normal.        Speech: Speech normal.        Behavior: Behavior normal.        Thought Content: Thought content normal.        Judgment: Judgment normal.      UC Treatments / Results  Labs (all labs ordered are listed, but only abnormal results are displayed) Labs Reviewed  POCT URINALYSIS DIP (MANUAL ENTRY) - Abnormal; Notable for the following components:      Result Value   Spec Grav, UA >=1.030 (*)    Blood, UA trace-intact (*)    All other components within normal limits  POC SARS CORONAVIRUS 2 AG -  ED    EKG   Radiology No results found.  Procedures Procedures (including critical care time)  Medications Ordered in UC Medications - No data to display  Initial Impression / Assessment and Plan / UC Course  I have reviewed the triage vital signs and the nursing notes.  Pertinent labs & imaging results that were available during my care of the patient were reviewed by me and considered in my medical decision making (see chart for details).   1.  Viral URI with cough Suspect viral URI, viral syndrome.  Strep/viral testing: Point-of-care COVID testing is negative.   Physical exam findings reassuring, vital signs hemodynamically stable, and lungs clear, therefore deferred imaging of the chest.  Advised supportive care/prescriptions for symptomatic relief as outlined in AVS.  2.  Urinary frequency Suspect urinary frequency is secondary to frequent intake of urinary irritants. Urinalysis is  unremarkable for signs of urinary tract infection. Specific gravity is elevated indicating mild dehydration.  She does not appear to be dehydrated clinically on exam. Advised to increase water intake and decreased use of urinary irritants.  This will likely improve symptoms.  May follow-up with primary care provider as needed.  Counseled patient on potential for adverse effects with medications prescribed/recommended today, strict ER and return-to-clinic precautions discussed, patient verbalized understanding.    Final Clinical Impressions(s) / UC Diagnoses   Final diagnoses:  Viral URI with cough  Urinary frequency     Discharge Instructions      You have a viral illness which will improve on its own with rest, fluids, and medications to help with your symptoms.  Tylenol , guaifenesin  (plain mucinex ), and saline nasal sprays may help relieve symptoms.  Tessalon  Perles as needed for cough.  Two teaspoons of honey in 1 cup of warm water every 4-6 hours may help with throat pains.  Humidifier in room at nighttime may help soothe cough (clean well daily).   Decrease your intake of urinary irritants and this will help with your urinary frequency. Your urinalysis does not show signs of urinary tract infection.  For chest pain, shortness of breath, inability to keep food or fluids down without vomiting, fever that does not respond to tylenol  or motrin , or any other severe symptoms, please go to the ER for further evaluation. Return to urgent care as needed, otherwise follow-up with PCP.     ED Prescriptions     Medication Sig Dispense Auth. Provider   Guaifenesin  1200 MG TB12 Take 1 tablet (1,200 mg total) by mouth in the morning and at bedtime. 14 tablet Starlene Eaton, FNP  benzonatate  (TESSALON ) 100 MG capsule Take 1 capsule (100 mg total) by mouth every 8 (eight) hours. 21 capsule Starlene Eaton, FNP      PDMP not reviewed this encounter.   Starlene Eaton, [place] 05/30/23 [phone]

## 2023-07-31 ENCOUNTER — Encounter (HOSPITAL_BASED_OUTPATIENT_CLINIC_OR_DEPARTMENT_OTHER): Payer: MEDICAID | Admitting: Certified Nurse Midwife

## 2023-10-17 ENCOUNTER — Ambulatory Visit (HOSPITAL_COMMUNITY): Payer: MEDICAID

## 2023-10-19 ENCOUNTER — Ambulatory Visit (INDEPENDENT_AMBULATORY_CARE_PROVIDER_SITE_OTHER): Payer: MEDICAID

## 2023-10-19 ENCOUNTER — Ambulatory Visit (HOSPITAL_COMMUNITY)
Admission: EM | Admit: 2023-10-19 | Discharge: 2023-10-19 | Disposition: A | Discharge status: Home / Self Care | Payer: MEDICAID | Attending: Emergency Medicine | Admitting: Emergency Medicine

## 2023-10-19 ENCOUNTER — Encounter (HOSPITAL_COMMUNITY): Payer: Self-pay

## 2023-10-19 ENCOUNTER — Ambulatory Visit (HOSPITAL_COMMUNITY): Payer: MEDICAID

## 2023-10-19 DIAGNOSIS — R350 Frequency of micturition: Secondary | ICD-10-CM | POA: Insufficient documentation

## 2023-10-19 DIAGNOSIS — M545 Low back pain, unspecified: Secondary | ICD-10-CM | POA: Insufficient documentation

## 2023-10-19 DIAGNOSIS — M5136 Other intervertebral disc degeneration, lumbar region with discogenic back pain only: Secondary | ICD-10-CM | POA: Diagnosis not present

## 2023-10-19 LAB — POCT URINALYSIS DIP (MANUAL ENTRY)
Bilirubin, UA: NEGATIVE
Glucose, UA: NEGATIVE mg/dL
Ketones, POC UA: NEGATIVE mg/dL
Leukocytes, UA: NEGATIVE
Nitrite, UA: NEGATIVE
Protein Ur, POC: NEGATIVE mg/dL
Spec Grav, UA: 1.025 (ref 1.010–1.025)
Urobilinogen, UA: 0.2 U/dL
pH, UA: 6.5 (ref 5.0–8.0)

## 2023-10-19 MED ORDER — ACETAMINOPHEN 500 MG PO TABS: 500.0000 mg | ORAL_TABLET | Freq: Four times a day (QID) | ORAL | 0 refills | Status: AC | PRN

## 2023-10-19 MED ORDER — METHOCARBAMOL 500 MG PO TABS: 500.0000 mg | ORAL_TABLET | Freq: Two times a day (BID) | ORAL | 0 refills | Status: AC

## 2023-10-19 NOTE — Discharge Instructions (Addendum)
 Your x-ray did not show any acute bony abnormality, it did show moderate of degenerative disc disease which is consistent with previous imaging.  This may be causing your back pain.  This is a chronic issue.  You are also appearing to have muscular back pain, you can try the muscle relaxer.  For your chronic back pain please follow-up with sports medicine or consider pain management.  For your chronic urinary incontinence, may be beneficial to follow-up with an OB/GYN, especially in the setting of your uterine lesions.  We are sending your urine for culture and we will contact you if antibiotics are indicated.

## 2023-10-19 NOTE — ED Provider Notes (Signed)
 [facility]    CSN: [id number] Arrival date & time: 10/19/23  1531      History   Chief Complaint Chief Complaint  Patient presents with   Back Pain    HPI Tasha Fernandez is a 53 y.o. female.   Patient presents to clinic over concern of low back pain for the past month.  Reports a burning sensation when she lifts heavy things or moves.  Low back pain does radiate up to the thoracic spine.  Full range of motion without difficulty.  Denies numbness, tingling or weakness in the legs.  Has not had any trauma or injuries.  Is concerned over urinary frequency with occasional incontinence, this has been ongoing for years.  History of some kind of cyst, reports had a procedure to remove these years ago.  Is wondering if perhaps these have returned, does not have an OB/GYN currently.  The history is provided by the patient and medical records.  Back Pain   Past Medical History:  Diagnosis Date   Anxiety    Arthritis    Herniated disc    Seasonal allergies    TMJ (dislocation of temporomandibular joint)     Patient Active Problem List   Diagnosis Date Noted   Acute pyelonephritis 05/14/2022   Severe sepsis (HCC) 05/14/2022   Acute hyponatremia 05/14/2022   Dehydration 05/14/2022   AKI (acute kidney injury) (HCC) 05/14/2022   Otitis, externa, infective 10/03/2012   Seasonal allergies 10/03/2012   Tachycardia 08/08/2012   Metrorrhagia 04/30/2012   Menorrhagia 04/30/2012   Atypical face pain 03/28/2012   Medical non-compliance 02/17/2012   Headache 11/19/2011   Chronic pain syndrome 10/09/2011   Cervicalgia 06/17/2011   Chronic jaw pain 04/22/2011   Medication overuse headache 01/17/2011   Somatic Dysfunction (Osteopathic Findings) 01/13/2011   Chronic headache 08/26/2010   ALLERGIC RHINITIS, SEASONAL 04/24/2008   GERD (gastroesophageal reflux disease) 04/24/2008   GAD (generalized anxiety disorder) 04/06/2006    Past Surgical History:  Procedure  Laterality Date   CESAREAN SECTION     CHOLECYSTECTOMY     TUBAL LIGATION      OB History   No obstetric history on file.      Home Medications    Prior to Admission medications   Medication Sig Start Date End Date Taking? Authorizing Provider  acetaminophen  (TYLENOL ) 500 MG tablet Take 1 tablet (500 mg total) by mouth every 6 (six) hours as needed. 10/19/23  Yes Lc Joynt  N, FNP  methocarbamol  (ROBAXIN ) 500 MG tablet Take 1 tablet (500 mg total) by mouth 2 (two) times daily. 10/19/23  Yes Saida Lonon  N, FNP  pantoprazole  (PROTONIX ) 20 MG tablet Take 1 tablet (20 mg total) by mouth 2 (two) times daily. Patient not taking: Reported on 07/11/2022 03/14/21   Dasie Faden, MD  montelukast  (SINGULAIR ) 10 MG tablet Take 1 tablet (10 mg total) by mouth daily. 07/02/10 04/21/11  Elpidio Reyes DEL, MD    Family History Family History  Problem Relation Age of Onset   Bursitis Mother     Social History Social History   Tobacco Use   Smoking status: Never   Smokeless tobacco: Never  Vaping Use   Vaping status: Never Used  Substance Use Topics   Alcohol use: No    Comment: social   Drug use: No     Allergies   Bee venom, Dilaudid  [hydromorphone ], and Other   Review of Systems Review of Systems  Per HPI  Physical Exam Triage Vital  Signs ED Triage Vitals [10/19/23 1608]  Encounter Vitals Group     BP (!) 145/94     Girls Systolic BP Percentile      Girls Diastolic BP Percentile      Boys Systolic BP Percentile      Boys Diastolic BP Percentile      Pulse Rate 71     Resp 18     Temp 98.1 F (36.7 C)     Temp Source Oral     SpO2 99 %     Weight      Height      Head Circumference      Peak Flow      Pain Score 6     Pain Loc      Pain Education      Exclude from Growth Chart    No data found.  Updated Vital Signs BP (!) 145/94 (BP Location: Right Arm)   Pulse 71   Temp 98.1 F (36.7 C) (Oral)   Resp 18   LMP 06/11/2013   SpO2 99%    Visual Acuity Right Eye Distance:   Left Eye Distance:   Bilateral Distance:    Right Eye Near:   Left Eye Near:    Bilateral Near:     Physical Exam Vitals and nursing note reviewed.  Constitutional:      Appearance: Normal appearance.  HENT:     Head: Normocephalic and atraumatic.     Right Ear: External ear normal.     Left Ear: External ear normal.     Nose: Nose normal.     Mouth/Throat:     Mouth: Mucous membranes are moist.  Eyes:     Conjunctiva/sclera: Conjunctivae normal.  Cardiovascular:     Rate and Rhythm: Normal rate.  Pulmonary:     Effort: Pulmonary effort is normal. No respiratory distress.  Musculoskeletal:        General: Normal range of motion.  Skin:    General: Skin is warm and dry.  Neurological:     General: No focal deficit present.     Mental Status: She is alert and oriented to person, place, and time.  Psychiatric:        Mood and Affect: Mood normal.        Behavior: Behavior normal.      UC Treatments / Results  Labs (all labs ordered are listed, but only abnormal results are displayed) Labs Reviewed  POCT URINALYSIS DIP (MANUAL ENTRY) - Abnormal; Notable for the following components:      Result Value   Blood, UA trace-intact (*)    All other components within normal limits  URINE CULTURE    EKG   Radiology DG Lumbar Spine Complete Result Date: 10/19/2023 CLINICAL DATA:  lumbar back pain EXAM: LUMBAR SPINE - COMPLETE 4+ VIEW COMPARISON:  None Available. FINDINGS: Five non rib-bearing lumbar type vertebral bodies. Normal alignment with expected lumbar lordosis. Vertebral body heights are well maintained without acute fracture. No pars interarticularis defects.Moderate intervertebral disc height loss at L5-S1 with osteophyte formation, unchanged. Cholecystectomy clips. IMPRESSION: 1. No acute fracture or malalignment of the lumbar spine. 2. Moderate degenerative disc disease at L5-S1, unchanged. Electronically Signed   By:  Rogelia Myers M.D.   On: 10/19/2023 17:17    Procedures Procedures (including critical care time)  Medications Ordered in UC Medications - No data to display  Initial Impression / Assessment and Plan / UC Course  I have reviewed the triage  vital signs and the nursing notes.  Pertinent labs & imaging results that were available during my care of the patient were reviewed by me and considered in my medical decision making (see chart for details).  Vitals and triage reviewed, patient is hemodynamically stable.  Full range of motion in back without difficulty, spine without step-off, tenderness or deformity.  Atraumatic.  Appears to be having some muscular back pain with heavy lifting, will trial muscle relaxers.  Encourage pain management and Ortho follow-up.  Without inner leg numbness or acute trauma, low concern for cauda equina or acute emergent etiology at this time.  Imaging by my interpretation does not show acute bony abnormality, confirmed with radiology overread.  Does show degenerative disc disease of the lumbar spine, chronic.  Urinary incontinence has been ongoing for years.  No evidence of acute UTI at this time, will send for culture.  Plan of care, follow-up care return precautions given, no questions at this time.     Final Clinical Impressions(s) / UC Diagnoses   Final diagnoses:  Low back pain, unspecified back pain laterality, unspecified chronicity, unspecified whether sciatica present  Urinary frequency  Degeneration of intervertebral disc of lumbar region with discogenic back pain     Discharge Instructions      Your x-ray did not show any acute bony abnormality, it did show moderate of degenerative disc disease which is consistent with previous imaging.  This may be causing your back pain.  This is a chronic issue.  You are also appearing to have muscular back pain, you can try the muscle relaxer.  For your chronic back pain please follow-up with sports  medicine or consider pain management.  For your chronic urinary incontinence, may be beneficial to follow-up with an OB/GYN, especially in the setting of your uterine lesions.  We are sending your urine for culture and we will contact you if antibiotics are indicated.      ED Prescriptions     Medication Sig Dispense Auth. Provider   methocarbamol  (ROBAXIN ) 500 MG tablet Take 1 tablet (500 mg total) by mouth 2 (two) times daily. 20 tablet Dreama, Alben Jepsen  N, FNP   acetaminophen  (TYLENOL ) 500 MG tablet Take 1 tablet (500 mg total) by mouth every 6 (six) hours as needed. 30 tablet Dreama, Yahya Boldman  N, FNP      PDMP not reviewed this encounter.   Dreama Trudie SAILOR, FNP 10/19/23 1738

## 2023-10-19 NOTE — ED Triage Notes (Signed)
 Pt c/o lower back pain x80month. States fell on her tailbone 2wks ago and made pain worse.   Pt c/o bladder pressure with urinary frequency with incontinence at times for past few months.

## 2023-10-20 ENCOUNTER — Ambulatory Visit (HOSPITAL_COMMUNITY): Payer: Self-pay

## 2023-10-20 LAB — URINE CULTURE: Culture: <10000 — AB

## 2023-11-30 ENCOUNTER — Ambulatory Visit (HOSPITAL_BASED_OUTPATIENT_CLINIC_OR_DEPARTMENT_OTHER): Payer: MEDICAID | Admitting: Certified Nurse Midwife

## 2023-11-30 NOTE — Progress Notes (Deleted)
 53 y.o. No obstetric history on file. Single White or Caucasian female here for annual exam.    Patient's last menstrual period was 06/11/2013.          Sexually active: {yes no:314532}  The current method of family planning is {contraception:315051}.     The pregnancy intention screening data noted above was reviewed. Potential methods of contraception were discussed. The patient elected to proceed with No data recorded.  Exercising: {yes no:314532}  {types:19826} Smoker:  {YES NO:22349}  Health Maintenance: Pap:  *** History of abnormal Pap:  {YES NO:22349} MMG:  scheduled for 12/08/2023  Colonoscopy:  02/13/2018 BMD:   N/A Screening Labs: ***   reports that she has never smoked. She has never used smokeless tobacco. She reports that she does not drink alcohol and does not use drugs.  Past Medical History:  Diagnosis Date   Anxiety    Arthritis    Herniated disc    Seasonal allergies    TMJ (dislocation of temporomandibular joint)     Past Surgical History:  Procedure Laterality Date   CESAREAN SECTION     CHOLECYSTECTOMY     TUBAL LIGATION      Current Outpatient Medications  Medication Sig Dispense Refill   acetaminophen  (TYLENOL ) 500 MG tablet Take 1 tablet (500 mg total) by mouth every 6 (six) hours as needed. 30 tablet 0   methocarbamol  (ROBAXIN ) 500 MG tablet Take 1 tablet (500 mg total) by mouth 2 (two) times daily. 20 tablet 0   pantoprazole  (PROTONIX ) 20 MG tablet Take 1 tablet (20 mg total) by mouth 2 (two) times daily. (Patient not taking: Reported on 07/11/2022) 60 tablet 0   No current facility-administered medications for this visit.    Family History  Problem Relation Age of Onset   Bursitis Mother     ROS: Constitutional: {Findings; ROS constitutional:30497::negative} Genitourinary:{Findings; ROS genitourinary:19593::negative}  Exam:   LMP 06/11/2013      General appearance: alert, cooperative and appears stated age Head: Normocephalic,  without obvious abnormality, atraumatic Neck: no adenopathy, supple, symmetrical, trachea midline and thyroid {EXAM; THYROID:18604} Lungs: clear to auscultation bilaterally Breasts: {Exam; breast:13139::normal appearance, no masses or tenderness} Heart: regular rate and rhythm Abdomen: soft, non-tender; bowel sounds normal; no masses,  no organomegaly Extremities: extremities normal, atraumatic, no cyanosis or edema Skin: Skin color, texture, turgor normal. No rashes or lesions Lymph nodes: Cervical, supraclavicular, and axillary nodes normal. No abnormal inguinal nodes palpated Neurologic: Grossly normal   Pelvic: External genitalia:  no lesions              Urethra:  normal appearing urethra with no masses, tenderness or lesions              Bartholins and Skenes: normal                 Vagina: normal appearing vagina with normal color and no discharge, no lesions              Cervix: {exam; cervix:14595}              Pap taken: {yes no:314532} Bimanual Exam:  Uterus:  {exam; uterus:12215}              Adnexa: {exam; adnexa:12223}               Rectovaginal: Confirms               Anus:  normal sphincter tone, no lesions  Chaperone, ***, CMA, was present for exam.  Assessment/Plan:

## 2024-02-13 ENCOUNTER — Emergency Department (HOSPITAL_COMMUNITY)
Admission: EM | Admit: 2024-02-13 | Discharge: 2024-02-14 | Disposition: A | Discharge status: Home / Self Care | Payer: MEDICAID | Attending: Emergency Medicine | Admitting: Emergency Medicine

## 2024-02-13 ENCOUNTER — Other Ambulatory Visit: Payer: Self-pay

## 2024-02-13 ENCOUNTER — Emergency Department (HOSPITAL_COMMUNITY): Payer: MEDICAID

## 2024-02-13 DIAGNOSIS — R112 Nausea with vomiting, unspecified: Secondary | ICD-10-CM | POA: Insufficient documentation

## 2024-02-13 DIAGNOSIS — R059 Cough, unspecified: Secondary | ICD-10-CM | POA: Diagnosis present

## 2024-02-13 DIAGNOSIS — J069 Acute upper respiratory infection, unspecified: Secondary | ICD-10-CM | POA: Diagnosis not present

## 2024-02-13 LAB — COMPREHENSIVE METABOLIC PANEL WITH GFR
ALT: 53 U/L — ABNORMAL HIGH (ref 0–44)
AST: 34 U/L (ref 15–41)
Albumin: 4.1 g/dL (ref 3.5–5.0)
Alkaline Phosphatase: 75 U/L (ref 38–126)
Anion gap: 12 (ref 5–15)
BUN: 9 mg/dL (ref 6–20)
CO2: 28 mmol/L (ref 22–32)
Calcium: 9.1 mg/dL (ref 8.9–10.3)
Chloride: 99 mmol/L (ref 98–111)
Creatinine, Ser: 0.8 mg/dL (ref 0.44–1.00)
GFR, Estimated: >60 mL/min
Glucose, Bld: 122 mg/dL — ABNORMAL HIGH (ref 70–99)
Potassium: 3.5 mmol/L (ref 3.5–5.1)
Sodium: 138 mmol/L (ref 135–145)
Total Bilirubin: 0.6 mg/dL (ref 0.0–1.2)
Total Protein: 7.5 g/dL (ref 6.5–8.1)

## 2024-02-13 LAB — URINALYSIS, ROUTINE W REFLEX MICROSCOPIC
Bacteria, UA: NONE SEEN
Bilirubin Urine: NEGATIVE
Glucose, UA: 50 mg/dL — AB
Hgb urine dipstick: NEGATIVE
Ketones, ur: NEGATIVE mg/dL
Nitrite: NEGATIVE
Protein, ur: NEGATIVE mg/dL
Specific Gravity, Urine: 1.016 (ref 1.005–1.030)
pH: 6 (ref 5.0–8.0)

## 2024-02-13 LAB — RESP PANEL BY RT-PCR (RSV, FLU A&B, COVID)  RVPGX2
Influenza A by PCR: NEGATIVE
Influenza B by PCR: NEGATIVE
Resp Syncytial Virus by PCR: NEGATIVE
SARS Coronavirus 2 by RT PCR: NEGATIVE

## 2024-02-13 LAB — LIPASE, BLOOD: Lipase: 13 U/L (ref 11–51)

## 2024-02-13 MED ORDER — ONDANSETRON 4 MG PO TBDP
4.0000 mg | ORAL_TABLET | Freq: Once | ORAL | Status: AC
  Administered 2024-02-13: 4 mg via ORAL
  Filled 2024-02-13: qty 1

## 2024-02-13 NOTE — ED Triage Notes (Signed)
 Pt c/o emesis x 4-5 days, congestion, productive cough, fevers; denies known sick contacts; reports HA and sore throat  Pt gives verbal consent for mse

## 2024-02-13 NOTE — ED Provider Triage Note (Signed)
 Emergency Medicine Provider Triage Evaluation Note  Tasha Fernandez , a 54 y.o. female  was evaluated in triage.  Pt complains of nausea/vomiting, cough, congestion, subjective fever/chills for the past 4 to 5 days.  Notes has not been able to eat and feels dehydrated.  Notes occasional abdominal cramping.  Also notes a headache.  Denies dizziness, syncope, chest pain, shortness of breath  Review of Systems  Positive: See above Negative: Abdominal pain, diarrhea/constipation  Physical Exam  BP (!) 147/86 (BP Location: Right Arm)   Pulse 70   Temp 98.5 F (36.9 C)   Resp 18   LMP 06/11/2013   SpO2 95%  Gen:   Awake, no distress   Resp:  Normal effort  MSK:   Moves extremities without difficulty Other:  Actively vomiting  Medical Decision Making  Medically screening exam initiated at 8:45 PM.  Appropriate orders placed.  Tasha Fernandez was informed that the remainder of the evaluation will be completed by another provider, this initial triage assessment does not replace that evaluation, and the importance of remaining in the ED until their evaluation is complete.     Neysa Thersia GORMAN, [place] 02/13/24 2048

## 2024-02-14 LAB — CBC WITH DIFFERENTIAL/PLATELET
Abs Immature Granulocytes: 0.08 K/uL — ABNORMAL HIGH (ref 0.00–0.07)
Basophils Absolute: 0 K/uL (ref 0.0–0.1)
Basophils Relative: 0 %
Eosinophils Absolute: 0 K/uL (ref 0.0–0.5)
Eosinophils Relative: 0 %
HCT: 43 % (ref 36.0–46.0)
Hemoglobin: 14.2 g/dL (ref 12.0–15.0)
Immature Granulocytes: 1 %
Lymphocytes Relative: 20 %
Lymphs Abs: 1.8 K/uL (ref 0.7–4.0)
MCH: 30.8 pg (ref 26.0–34.0)
MCHC: 33 g/dL (ref 30.0–36.0)
MCV: 93.3 fL (ref 80.0–100.0)
Monocytes Absolute: 0.5 K/uL (ref 0.1–1.0)
Monocytes Relative: 6 %
Neutro Abs: 6.4 K/uL (ref 1.7–7.7)
Neutrophils Relative %: 73 %
Platelets: 267 K/uL (ref 150–400)
RBC: 4.61 MIL/uL (ref 3.87–5.11)
RDW: 12.4 % (ref 11.5–15.5)
WBC: 8.7 K/uL (ref 4.0–10.5)
nRBC: 0 % (ref 0.0–0.2)

## 2024-02-14 MED ORDER — METOCLOPRAMIDE HCL 10 MG PO TABS: 10.0000 mg | ORAL_TABLET | Freq: Four times a day (QID) | ORAL | 0 refills | Status: AC

## 2024-02-14 MED ORDER — METOCLOPRAMIDE HCL 10 MG PO TABS
10.0000 mg | ORAL_TABLET | Freq: Once | ORAL | Status: AC
  Administered 2024-02-14: 10 mg via ORAL
  Filled 2024-02-14: qty 1

## 2024-02-14 MED ORDER — GUAIFENESIN 100 MG/5ML PO LIQD: 100.0000 mg | ORAL | 0 refills | Status: AC | PRN

## 2024-02-14 MED ORDER — ONDANSETRON 4 MG PO TBDP
4.0000 mg | ORAL_TABLET | Freq: Once | ORAL | Status: AC
  Administered 2024-02-14: 4 mg via ORAL
  Filled 2024-02-14: qty 1

## 2024-02-14 NOTE — ED Notes (Signed)
 Pt. Requesting medication for Nausea.

## 2024-02-14 NOTE — ED Provider Notes (Signed)
 " [place] EMERGENCY DEPARTMENT AT [facility] Provider Note   CSN: [id number] Arrival date & time: 02/13/24  1622     Patient presents with: No chief complaint on file.   Tasha Fernandez is a 54 y.o. female.   54 year old female history of anxiety presents to the emergency department with headache, sore throat, cough, and nausea and vomiting for the past 5 days.  Also has some congestion as well and subjective fevers.  Headache as well.  No known sick contacts.  No marijuana use.  No abdominal surgeries.  Occasionally has abdominal pain with this but none now.       Prior to Admission medications  Medication Sig Start Date End Date Taking? Authorizing Provider  guaiFENesin  (ROBITUSSIN) 100 MG/5ML liquid Take 5-10 mLs (100-200 mg total) by mouth every 4 (four) hours as needed for cough or to loosen phlegm. 02/14/24  Yes Yolande Lamar BROCKS, MD  metoCLOPramide  (REGLAN ) 10 MG tablet Take 1 tablet (10 mg total) by mouth every 6 (six) hours. 02/14/24  Yes Yolande Lamar BROCKS, MD  acetaminophen  (TYLENOL ) 500 MG tablet Take 1 tablet (500 mg total) by mouth every 6 (six) hours as needed. 10/19/23   Dreama, [place]  N, FNP  methocarbamol  (ROBAXIN ) 500 MG tablet Take 1 tablet (500 mg total) by mouth 2 (two) times daily. 10/19/23   Dreama, [place]  N, FNP  pantoprazole  (PROTONIX ) 20 MG tablet Take 1 tablet (20 mg total) by mouth 2 (two) times daily. Patient not taking: Reported on 07/11/2022 03/14/21   Dasie Faden, MD  montelukast  (SINGULAIR ) 10 MG tablet Take 1 tablet (10 mg total) by mouth daily. 07/02/10 04/21/11  Elpidio Reyes DEL, MD    Allergies: Bee venom, Dilaudid  [hydromorphone ], and Other    Review of Systems  Updated Vital Signs BP (!) 147/84   Pulse 71   Temp 97.8 F (36.6 C) (Oral)   Resp 18   LMP 06/11/2013   SpO2 99%   Physical Exam Vitals and nursing note reviewed.  Constitutional:      General: She is not in acute distress.    Appearance: She is  well-developed.  HENT:     Head: Normocephalic and atraumatic.     Right Ear: External ear normal.     Left Ear: External ear normal.     Nose: Congestion present.     Mouth/Throat:     Mouth: Mucous membranes are moist.     Pharynx: Posterior oropharyngeal erythema present.  Eyes:     Extraocular Movements: Extraocular movements intact.     Conjunctiva/sclera: Conjunctivae normal.     Pupils: Pupils are equal, round, and reactive to light.  Cardiovascular:     Rate and Rhythm: Normal rate and regular rhythm.     Heart sounds: No murmur heard. Pulmonary:     Effort: Pulmonary effort is normal. No respiratory distress.     Breath sounds: Normal breath sounds.  Abdominal:     General: Abdomen is flat. There is no distension.     Palpations: Abdomen is soft. There is no mass.     Tenderness: There is no abdominal tenderness. There is no guarding.  Musculoskeletal:     Cervical back: Normal range of motion and neck supple.  Skin:    General: Skin is warm and dry.  Neurological:     Mental Status: She is alert and oriented to person, place, and time. Mental status is at baseline.  Psychiatric:        Mood  and Affect: Mood normal.     (all labs ordered are listed, but only abnormal results are displayed) Labs Reviewed  COMPREHENSIVE METABOLIC PANEL WITH GFR - Abnormal; Notable for the following components:      Result Value   Glucose, Bld 122 (*)    ALT 53 (*)    All other components within normal limits  URINALYSIS, ROUTINE W REFLEX MICROSCOPIC - Abnormal; Notable for the following components:   Glucose, UA 50 (*)    Leukocytes,Ua TRACE (*)    All other components within normal limits  CBC WITH DIFFERENTIAL/PLATELET - Abnormal; Notable for the following components:   Abs Immature Granulocytes 0.08 (*)    All other components within normal limits  RESP PANEL BY RT-PCR (RSV, FLU A&B, COVID)  [id number]  LIPASE, BLOOD    EKG: None  Radiology: DG Chest Portable 1  View Result Date: 02/13/2024 EXAM: 1 VIEW(S) XRAY OF THE CHEST 02/13/2024 10:06:00 PM COMPARISON: 10/21/2022 CLINICAL HISTORY: cough FINDINGS: LUNGS AND PLEURA: No focal pulmonary opacity. No pleural effusion. No pneumothorax. HEART AND MEDIASTINUM: No acute abnormality of the cardiac and mediastinal silhouettes. BONES AND SOFT TISSUES: Right upper quadrant surgical clips noted. No acute osseous abnormality. IMPRESSION: 1. No acute findings. Electronically signed by: Elsie Gravely MD 02/13/2024 10:08 PM EST RP Workstation: [id number]     Procedures   Medications Ordered in the ED  ondansetron  (ZOFRAN -ODT) disintegrating tablet 4 mg (4 mg Oral Given 02/13/24 2052)  ondansetron  (ZOFRAN -ODT) disintegrating tablet 4 mg (4 mg Oral Given 02/14/24 0952)  metoCLOPramide  (REGLAN ) tablet 10 mg (10 mg Oral Given 02/14/24 1126)                                    Medical Decision Making Amount and/or Complexity of Data Reviewed Labs: ordered.  Risk OTC drugs. Prescription drug management.   Tasha Fernandez is a 54 year old female history of anxiety presents to the emergency department with headache, sore throat, cough, and nausea and vomiting.   Initial Ddx:  URI, pneumonia, gastroenteritis, cannabinoid hyperemesis, bowel obstruction, ileus  MDM/Course:  Patient presents to the emergency department with URI type symptoms for 5 days.  On exam does not in acute distress.  Satting well on room air.  She is afebrile.  Heart and lung sound clear.  Does have some posterior oropharynx erythema.  No exudates.  COVID and flu negative.  Chest x-ray without pneumonia.  UA without UTI.  The remainder of her blood work was reassuring.  Upon re-evaluation she is able to tolerate p.o.  Suspect she has a viral illness causing her symptoms.  Will have her follow-up with her primary doctor in several days.  Given a prescription of antiemetics at home  This patient presents to the ED for concern of complaints listed  in HPI, this involves an extensive number of treatment options, and is a complaint that carries with it a high risk of complications and morbidity. Disposition including potential need for admission considered.   Dispo: DC Home. Return precautions discussed including, but not limited to, those listed in the AVS. Allowed pt time to ask questions which were answered fully prior to dc.  I have reviewed the patients home medications and made adjustments as needed Records reviewed Outpatient Clinic Notes The following labs were independently interpreted: Chemistry and show no acute abnormality I independently reviewed the following imaging with scope of interpretation limited to determining acute life  threatening conditions related to emergency care: Chest x-ray and agree with the radiologist interpretation with the following exceptions: none I personally reviewed and interpreted cardiac monitoring: normal sinus rhythm  I personally reviewed and interpreted the pt's EKG: see above for interpretation   Portions of this note were generated with Dragon dictation software. Dictation errors may occur despite best attempts at proofreading.     Final diagnoses:  Upper respiratory tract infection, unspecified type  Nausea and vomiting, unspecified vomiting type    ED Discharge Orders          Ordered    metoCLOPramide  (REGLAN ) 10 MG tablet  Every 6 hours        02/14/24 1208    guaiFENesin  (ROBITUSSIN) 100 MG/5ML liquid  Every 4 hours PRN        02/14/24 1208               Yolande Lamar BROCKS, MD 02/14/24 2015  "

## 2024-02-14 NOTE — Discharge Instructions (Signed)
 You were seen for your upper respiratory tract infection in the emergency department.   At home, please use Tylenol  and ibuprofen  for your muscle aches and fevers.  Please use over-the-counter cough medication or tea with honey for your cough. Use the reglan  for nausea and vomiting. Use the mucinex  for your chest congestion  Follow-up with your primary doctor in 2-3 days regarding your visit.  This may be over the phone.  Return immediately to the emergency department if you experience any of the following: Difficulty breathing, or any other concerning symptoms.    Thank you for visiting our Emergency Department. It was a pleasure taking care of you today.
